# Patient Record
Sex: Female | Born: 1955
Health system: Southern US, Community
[De-identification: ages and names within clinical notes are randomized; demographics above are authoritative.]

## PROBLEM LIST (undated history)

## (undated) DIAGNOSIS — M810 Age-related osteoporosis without current pathological fracture: Secondary | ICD-10-CM

## (undated) DIAGNOSIS — E039 Hypothyroidism, unspecified: Secondary | ICD-10-CM

## (undated) DIAGNOSIS — G43909 Migraine, unspecified, not intractable, without status migrainosus: Secondary | ICD-10-CM

## (undated) DIAGNOSIS — K219 Gastro-esophageal reflux disease without esophagitis: Secondary | ICD-10-CM

## (undated) DIAGNOSIS — F32A Depression, unspecified: Secondary | ICD-10-CM

## (undated) DIAGNOSIS — J309 Allergic rhinitis, unspecified: Secondary | ICD-10-CM

## (undated) DIAGNOSIS — M8580 Other specified disorders of bone density and structure, unspecified site: Secondary | ICD-10-CM

## (undated) DIAGNOSIS — D509 Iron deficiency anemia, unspecified: Secondary | ICD-10-CM

## (undated) DIAGNOSIS — N189 Chronic kidney disease, unspecified: Secondary | ICD-10-CM

## (undated) DIAGNOSIS — R7989 Other specified abnormal findings of blood chemistry: Secondary | ICD-10-CM

## (undated) DIAGNOSIS — F329 Major depressive disorder, single episode, unspecified: Secondary | ICD-10-CM

## (undated) HISTORY — DX: Iron deficiency anemia, unspecified: D50.9

## (undated) HISTORY — DX: Depression, unspecified: F32.A

## (undated) HISTORY — DX: Allergic rhinitis, unspecified: J30.9

## (undated) HISTORY — DX: Gastro-esophageal reflux disease without esophagitis: K21.9

## (undated) HISTORY — DX: Age-related osteoporosis without current pathological fracture: M81.0

## (undated) HISTORY — DX: Chronic kidney disease, unspecified: N18.9

## (undated) HISTORY — DX: Hypothyroidism, unspecified: E03.9

## (undated) HISTORY — DX: Other specified disorders of bone density and structure, unspecified site: M85.80

## (undated) HISTORY — DX: Major depressive disorder, single episode, unspecified: F32.9

## (undated) HISTORY — DX: Other specified abnormal findings of blood chemistry: R79.89

---
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
## 1988-06-05 HISTORY — PX: OTHER SURGICAL HISTORY: SHX169

## 1995-06-06 HISTORY — PX: TUBAL LIGATION: SHX77

## 2009-10-24 ENCOUNTER — Emergency Department (HOSPITAL_COMMUNITY): Admission: EM | Admit: 2009-10-24 | Discharge: 2009-10-24 | Payer: Self-pay | Admitting: Emergency Medicine

## 2014-04-23 ENCOUNTER — Encounter (HOSPITAL_COMMUNITY): Payer: Self-pay | Admitting: *Deleted

## 2014-04-23 ENCOUNTER — Emergency Department (HOSPITAL_COMMUNITY)
Admission: EM | Admit: 2014-04-23 | Discharge: 2014-04-24 | Disposition: A | Discharge status: Home / Self Care | Payer: BC Managed Care – PPO | Attending: Emergency Medicine | Admitting: Emergency Medicine

## 2014-04-23 DIAGNOSIS — G43909 Migraine, unspecified, not intractable, without status migrainosus: Secondary | ICD-10-CM | POA: Insufficient documentation

## 2014-04-23 DIAGNOSIS — R519 Headache, unspecified: Secondary | ICD-10-CM

## 2014-04-23 DIAGNOSIS — R51 Headache: Secondary | ICD-10-CM

## 2014-04-23 HISTORY — DX: Migraine, unspecified, not intractable, without status migrainosus: G43.909

## 2014-04-23 NOTE — ED Notes (Signed)
Pt reporting intermittent headache for 3 days.  Reporting nausea, vomiting, sensitivity to light and sound.  States that she may also have a sinus infection. Pt states that she took 1/2 a demerol tablet, compazine, and an allegra with no relief. Meds taken about 2 this afternoon.

## 2014-04-24 MED ORDER — PROCHLORPERAZINE EDISYLATE 5 MG/ML IJ SOLN
10.0000 mg | Freq: Once | INTRAMUSCULAR | Status: AC
  Administered 2014-04-24: 10 mg via INTRAVENOUS
  Filled 2014-04-24: qty 2

## 2014-04-24 MED ORDER — DIPHENHYDRAMINE HCL 50 MG/ML IJ SOLN
25.0000 mg | Freq: Once | INTRAMUSCULAR | Status: AC
  Administered 2014-04-24: 25 mg via INTRAVENOUS
  Filled 2014-04-24: qty 1

## 2014-04-24 MED ORDER — DEXAMETHASONE SODIUM PHOSPHATE 10 MG/ML IJ SOLN
10.0000 mg | Freq: Once | INTRAMUSCULAR | Status: AC
  Administered 2014-04-24: 10 mg via INTRAVENOUS
  Filled 2014-04-24: qty 1

## 2014-04-24 MED ORDER — SODIUM CHLORIDE 0.9 % IV BOLUS (SEPSIS)
1000.0000 mL | Freq: Once | INTRAVENOUS | Status: AC
  Administered 2014-04-24: 1000 mL via INTRAVENOUS

## 2014-04-24 MED ORDER — KETOROLAC TROMETHAMINE 30 MG/ML IJ SOLN
30.0000 mg | Freq: Once | INTRAMUSCULAR | Status: AC
  Administered 2014-04-24: 30 mg via INTRAVENOUS
  Filled 2014-04-24: qty 1

## 2014-04-24 NOTE — ED Provider Notes (Signed)
CSN: [id number]     Arrival date & time 04/23/14  2201 History  This chart was scribed for Hanley SeamenJohn L Nizhoni Parlow, MD by Annye AsaAnna Dorsett, ED Scribe. This patient was seen in room APA09/APA09 and the patient's care was started at 12:11 AM.    Chief Complaint  Patient presents with  . Migraine   The history is provided by the patient. No language interpreter was used.     HPI Comments: Sabrina Duke is a 58 y.o. female with past medical history of migraines and sinus infections who presents to the Emergency Department complaining of 3 days of intermittent, severe migraine headache. This feels like a typical migraine for her, localized in her left temple and left cheek, described as throbbing. She reports nausea, vomiting, blurred vision, photophobia, phonophobia. She notes that she has been doing sinus rinses, along with taking Allegera, half a Demerol and Compazine with no relief.   She feels as though she has fluid in her ears; she can "hear" a rushing fluid sound.   Past Medical History  Diagnosis Date  . Migraine    Past Surgical History  Procedure Laterality Date  . Hemroidectomy    . Tubal ligation     History reviewed. No pertinent family history. History  Substance Use Topics  . Smoking status: Never Smoker   . Smokeless tobacco: Not on file  . Alcohol Use: No   OB History    No data available     Review of Systems  A complete 10 system review of systems was obtained and all systems are negative except as noted in the HPI and PMH.   Allergies  Ciprofloxacin; Ivp dye; Macrobid; and Vantin  Home Medications   Prior to Admission medications   Not on File   BP 119/56 mmHg  Pulse 66  Temp(Src) 97.9 F (36.6 C) (Oral)  Resp 20  Ht 5\' 4"  (1.626 m)  Wt 123 lb (55.792 kg)  BMI 21.10 kg/m2  SpO2 100%   Physical Exam  Nursing note and vitals reviewed. General: Well-developed, well-nourished female in no acute distress; appearance consistent with age of record HENT:  normocephalic; atraumatic; TMs normal Eyes: pupils equal, round and reactive to light; extraocular muscles intact; photophobia Neck: supple Heart: regular rate and rhythm Lungs: clear to auscultation bilaterally Abdomen: soft; nondistended; nontender; no masses or hepatosplenomegaly; bowel sounds present Extremities: No deformity; full range of motion; pulses normal Neurologic: Awake, alert and oriented; motor function intact in all extremities and symmetric; no facial droop; normal coordination and speech Skin: Warm and dry Psychiatric: Normal mood and affect  ED Course  Procedures   DIAGNOSTIC STUDIES: Oxygen Saturation is 100% on RA, normal by my interpretation.    COORDINATION OF CARE: 12:18 AM Discussed treatment plan with pt at bedside and pt agreed to plan.  MDM  1:40 AM Headache improved after IV fluids and medications. Patient ready to go home.  I personally performed the services described in this documentation, which was scribed in my presence. The recorded information has been reviewed and is accurate.     Hanley SeamenJohn L Uel Davidow, MD 04/24/14 [id number]

## 2014-08-04 ENCOUNTER — Encounter: Payer: Self-pay | Admitting: Gastroenterology

## 2014-09-22 ENCOUNTER — Ambulatory Visit: Payer: Self-pay | Admitting: Gastroenterology

## 2015-05-06 HISTORY — PX: OTHER SURGICAL HISTORY: SHX169

## 2017-05-01 DIAGNOSIS — F331 Major depressive disorder, recurrent, moderate: Secondary | ICD-10-CM | POA: Diagnosis not present

## 2017-06-13 DIAGNOSIS — F331 Major depressive disorder, recurrent, moderate: Secondary | ICD-10-CM | POA: Diagnosis not present

## 2017-06-27 ENCOUNTER — Ambulatory Visit: Payer: BLUE CROSS/BLUE SHIELD | Admitting: Neurology

## 2017-06-27 ENCOUNTER — Encounter: Payer: Self-pay | Admitting: Neurology

## 2017-06-27 VITALS — BP 94/68 | HR 63 | Ht 64.0 in | Wt 146.4 lb

## 2017-06-27 DIAGNOSIS — M5416 Radiculopathy, lumbar region: Secondary | ICD-10-CM

## 2017-06-27 DIAGNOSIS — G43711 Chronic migraine without aura, intractable, with status migrainosus: Secondary | ICD-10-CM

## 2017-06-27 DIAGNOSIS — R29898 Other symptoms and signs involving the musculoskeletal system: Secondary | ICD-10-CM | POA: Diagnosis not present

## 2017-06-27 DIAGNOSIS — M5440 Lumbago with sciatica, unspecified side: Secondary | ICD-10-CM | POA: Diagnosis not present

## 2017-06-27 DIAGNOSIS — G43709 Chronic migraine without aura, not intractable, without status migrainosus: Secondary | ICD-10-CM | POA: Diagnosis not present

## 2017-06-27 DIAGNOSIS — G8929 Other chronic pain: Secondary | ICD-10-CM | POA: Diagnosis not present

## 2017-06-27 DIAGNOSIS — W19XXXA Unspecified fall, initial encounter: Secondary | ICD-10-CM

## 2017-06-27 MED ORDER — TIZANIDINE HCL 4 MG PO CAPS: 4.0000 mg | ORAL_CAPSULE | Freq: Three times a day (TID) | ORAL | 3 refills | Status: DC | PRN

## 2017-06-27 NOTE — Progress Notes (Signed)
[facility]    Provider:  Dr Lucia GaskinsAhern Referring Provider: [name], [name], MD Primary Care Physician:  [name], [name], MD  CC:  migraines  HPI:  Sabrina Duke is a 62 y.o. female here as a referral from Dr. Clarisse GougeLewit for migraines.  Past medical history of migraines, chronic low back pain, depression.  She has migraines for decades. She uses 12 maxalt a month, does not use Tylenol or ibuprofen or other medications. She takes Demerol for severe headaches. In a month she has 20 headache days a month on average and 12 are migrainous. Barometric pressure is a trigger. No known other triggers, maybe stress or sleeping. She endorses photophobia/phonophobia. Migraines can last > 24 hours. No medication overuse. No aura.  Headaches are unilateral, pulsating and pounding. Can be severe. No other focal neurologic deficits, associated symptoms, inciting events or modifiable factors.  Numbness in the leg started 2 months ago, hurts more with leg in a recliner, tingling from the knee to the toe. She has spasms in the back, radicular symptoms, bending over makes it worse with spasms. She has tried conservative measures for 2 months, heat, stretching, exercise, PT, NSAIDs. No relief. Has weakness. It is worsening slowly. No inciting events or trauma.  Reviewed notes, labs and imaging from outside physicians, which showed:  2013 Myelogram: FINDINGS:  AGAIN NOTED PATIENT HAS 6 LUMBAR VERTEBRAL BODIES. THERE IS A MINIMAL VENTRAL DEFECT AT THE LEVEL OF L5/6 SEEN ON LATERAL FILM. THE NERVE ROOTS ARE WELL FILLED WITHOUT EVIDENCE OF COMPRESSION OR DISPLACEMENT. THE CONUS IS NORMAL IN APPEARANCE.  CONCLUSIONS:  1.  SIX LUMBAR VERTEBRAL BODIES. 2.  NO EVIDENCE OF NERVE ROOT COMPRESSION OR DISPLACEMENT. 3.  SMALL VENTRAL BULGE AT L5/6. APPROVING MD: [name], [name]  Reviewed prior notes, patient was seen in the emergency room in November 2015 complaining of intermittent severe migraine headaches.   Localized in her left temple and left cheek described as throbbing.  She reported nausea, vomiting, blurred vision, photophobia, phonophobia.  She tried Demerol and Compazine with no relief.  Felt as though there was fluid in her ears.  Headaches improved after IV fluids and medications and she was discharged home.   Review of Systems: Patient complains of symptoms per HPI as well as the following symptoms: . Pertinent negatives and positives per HPI. All others negative.   Social History   Socioeconomic History  . Marital status: Married    Spouse name: Not on file  . Number of children: 1  . Years of education: Not on file  . Highest education level: High school graduate  Social Needs  . Financial resource strain: Not on file  . Food insecurity - worry: Not on file  . Food insecurity - inability: Not on file  . Transportation needs - medical: Not on file  . Transportation needs - non-medical: Not on file  Occupational History  . Occupation: disabled  Tobacco Use  . Smoking status: Never Smoker  . Smokeless tobacco: Never Used  Substance and Sexual Activity  . Alcohol use: No  . Drug use: No  . Sexual activity: Not on file  Other Topics Concern  . Not on file  Social History Narrative   Lives at home with husband   Right handed   No caffeine    Family History  Problem Relation Age of Onset  . Anxiety disorder Mother   . Depression Mother   . Cerebral aneurysm Maternal Uncle     Past Medical History:  Diagnosis Date  . Decreased bone density   . Depression   . GERD (gastroesophageal reflux disease)   . Iron deficiency anemia   . Low vitamin D level   . Migraine     Past Surgical History:  Procedure Laterality Date  . hemroidectomy  1990  . OVARIAN CYST SURGERY  1972  . TUBAL LIGATION  1997  . uterine polyp removal  05/2015    Current Outpatient Medications  Medication Sig Dispense Refill  . desvenlafaxine (PRISTIQ) 50 MG 24 hr tablet Take 50 mg by  mouth every morning.    . Eszopiclone (ESZOPICLONE) 3 MG TABS Take 3 mg by mouth at bedtime. Take immediately before bedtime    . Magnesium 400 MG TABS Take 400 mg by mouth at bedtime.    . meperidine (DEMEROL) 100 MG tablet Take 100 mg by mouth as needed for pain (no more than 2 per week).    . prochlorperazine (COMPAZINE) 10 MG tablet Take 10 mg by mouth as needed for nausea or vomiting.    . rizatriptan (MAXALT) 10 MG tablet Take 10 mg by mouth as needed for migraine. May repeat in 2 hours if needed    . topiramate (TOPAMAX) 50 MG tablet Take 150 mg by mouth at bedtime.    Marland Kitchen tiZANidine (ZANAFLEX) 4 MG capsule Take 1 capsule (4 mg total) by mouth 3 (three) times daily as needed for muscle spasms. 90 capsule 3   No current facility-administered medications for this visit.     Allergies as of 06/27/2017 - Review Complete 04/23/2014  Allergen Reaction Noted  . Ciprofloxacin  04/23/2014  . Ivp dye [iodinated diagnostic agents]  04/23/2014  . Lortab [hydrocodone-acetaminophen]  06/27/2017  . Macrobid [nitrofurantoin]  04/23/2014  . Nsaids  06/27/2017  . Other  06/27/2017  . Prednisone  06/27/2017  . Vantin [cefpodoxime]  04/23/2014    Vitals: BP 94/68 (BP Location: Right Arm, Patient Position: Sitting)   Pulse 63   Ht 5\' 4"  (1.626 m)   Wt 146 lb 6.4 oz (66.4 kg)   BMI 25.13 kg/m  Last Weight:  Wt Readings from Last 1 Encounters:  06/27/17 146 lb 6.4 oz (66.4 kg)   Last Height:   Ht Readings from Last 1 Encounters:  06/27/17 5\' 4"  (1.626 m)     Physical exam: Exam: Gen: NAD, conversant, well nourised, obese, well groomed                     CV: RRR, no MRG. No Carotid Bruits. No peripheral edema, warm, nontender Eyes: Conjunctivae clear without exudates or hemorrhage  Neuro: Detailed Neurologic Exam  Speech:    Speech is normal; fluent and spontaneous with normal comprehension.  Cognition:    The patient is oriented to person, place, and time;     recent and remote  memory intact;     language fluent;     normal attention, concentration,     fund of knowledge Cranial Nerves:    The pupils are equal, round, and reactive to light. The fundi are normal and spontaneous venous pulsations are present. Visual fields are full to finger confrontation. Extraocular movements are intact. Trigeminal sensation is intact and the muscles of mastication are normal. The face is symmetric. The palate elevates in the midline. Hearing intact. Voice is normal. Shoulder shrug is normal. The tongue has normal motion without fasciculations.   Coordination:    Normal finger to nose and heel to shin. Normal rapid alternating  movements.   Gait:    Heel-toe and tandem gait are normal.   Motor Observation:    No asymmetry, no atrophy, and no involuntary movements noted. Tone:    Normal muscle tone.    Posture:    Posture is normal. normal erect    Strength:    Left leg flexion weakness and left foot dorsiflexion eakness     Sensation: intact to LT     Reflex Exam:  DTR's:    Deep tendon reflexes in the upper and lower extremities are normal bilaterally.   Toes:    The toes are downgoing bilaterally.   Clonus:    Clonus is absent.     Assessment/Plan:  62 year old with chronic migraines, intractable. Also with lumbar radiculopathy, left leg shows weakness in an L5 distribution, she has tried conservative measures and leg is worsening.  MRI lumbar spine for radiculopathy to eval for surgical options or epidural steroid injections Tizanidine for back apsams Continue Topamax and Maxalt for migraines Migraines have not changed in severity or quality or frequency in many years, at this point no indication for MRI of the brain however can consider in the future if needed.  Orders Placed This Encounter  Procedures  . MR LUMBAR SPINE WO CONTRAST  . Comprehensive metabolic panel  . CBC   Discussed: To prevent or relieve headaches, try the following: Cool Compress.  Lie down and place a cool compress on your head.  Avoid headache triggers. If certain foods or odors seem to have triggered your migraines in the past, avoid them. A headache diary might help you identify triggers.  Include physical activity in your daily routine. Try a daily walk or other moderate aerobic exercise.  Manage stress. Find healthy ways to cope with the stressors, such as delegating tasks on your to-do list.  Practice relaxation techniques. Try deep breathing, yoga, massage and visualization.  Eat regularly. Eating regularly scheduled meals and maintaining a healthy diet might help prevent headaches. Also, drink plenty of fluids.  Follow a regular sleep schedule. Sleep deprivation might contribute to headaches Consider biofeedback. With this mind-body technique, you learn to control certain bodily functions - such as muscle tension, heart rate and blood pressure - to prevent headaches or reduce headache pain.    Proceed to emergency room if you experience new or worsening symptoms or symptoms do not resolve, if you have new neurologic symptoms or if headache is severe, or for any concerning symptom.   Provided education and documentation from American headache Society toolbox including articles on: chronic migraine medication overuse headache, chronic migraines, prevention of migraines, behavioral and other nonpharmacologic treatments for headache.  Cc: Amelia Jo, MD  Naomie Dean, MD  [facility] [address] [place], [place] [address]  Phone [phone] Fax [phone]

## 2017-06-27 NOTE — Patient Instructions (Signed)
MRI lumbar spine    

## 2017-07-02 ENCOUNTER — Encounter: Payer: Self-pay | Admitting: Neurology

## 2017-07-02 ENCOUNTER — Telehealth: Payer: Self-pay | Admitting: Neurology

## 2017-07-02 DIAGNOSIS — G43709 Chronic migraine without aura, not intractable, without status migrainosus: Secondary | ICD-10-CM | POA: Insufficient documentation

## 2017-07-02 NOTE — Telephone Encounter (Signed)
Pt calling stating that on her office visit with Dr Lucia GaskinsAhern she was told a low dose muscle relaxer prescription would be given to her but she didn't have anything in her paperwork.  Pt would still like to get it and she would to use  [facility] - [place], [place] - [address] [phone] (Phone) [phone] (Fax)   Please call pt

## 2017-07-02 NOTE — Telephone Encounter (Signed)
Called patient and discussed that Tizanidine 4 mg PO TID PRN had been sent to [facility] on 06/27/17. She is aware to watch for dizziness and RN advised not to drive when she first starts taking it and see how her body reacts to medication. She verbalized understanding and appreciation. She will call with any further questions and will consult pharmacy with any questions @ pickup.

## 2017-07-11 ENCOUNTER — Other Ambulatory Visit: Payer: BLUE CROSS/BLUE SHIELD

## 2017-07-26 DIAGNOSIS — F331 Major depressive disorder, recurrent, moderate: Secondary | ICD-10-CM | POA: Diagnosis not present

## 2017-08-28 ENCOUNTER — Encounter: Payer: Self-pay | Admitting: Adult Health

## 2017-09-14 DIAGNOSIS — F331 Major depressive disorder, recurrent, moderate: Secondary | ICD-10-CM | POA: Diagnosis not present

## 2017-10-01 ENCOUNTER — Other Ambulatory Visit: Payer: Self-pay | Admitting: *Deleted

## 2017-10-01 MED ORDER — RIZATRIPTAN BENZOATE 10 MG PO TABS: 10.0000 mg | ORAL_TABLET | ORAL | 11 refills | Status: DC | PRN

## 2017-10-01 NOTE — Telephone Encounter (Signed)
Received refill request from [facility] for Rizatriptan 10 mg tablet. Per Dr. Lucia Gaskins, ok to initiate refills for a year. Rizatriptan 10 mg #10 tablets, 11 refills ordered and printed.   Prescription faxed to [facility]. Received a receipt of confirmation.

## 2017-10-19 DIAGNOSIS — M25512 Pain in left shoulder: Secondary | ICD-10-CM | POA: Diagnosis not present

## 2017-10-23 DIAGNOSIS — G8929 Other chronic pain: Secondary | ICD-10-CM | POA: Diagnosis not present

## 2017-10-23 DIAGNOSIS — M25512 Pain in left shoulder: Secondary | ICD-10-CM | POA: Diagnosis not present

## 2017-10-23 DIAGNOSIS — M4722 Other spondylosis with radiculopathy, cervical region: Secondary | ICD-10-CM | POA: Diagnosis not present

## 2017-10-23 DIAGNOSIS — Z6825 Body mass index (BMI) 25.0-25.9, adult: Secondary | ICD-10-CM | POA: Diagnosis not present

## 2017-11-03 HISTORY — PX: SHOULDER SURGERY: SHX246

## 2017-11-20 DIAGNOSIS — F331 Major depressive disorder, recurrent, moderate: Secondary | ICD-10-CM | POA: Diagnosis not present

## 2017-11-28 DIAGNOSIS — X58XXXA Exposure to other specified factors, initial encounter: Secondary | ICD-10-CM | POA: Diagnosis not present

## 2017-11-28 DIAGNOSIS — Y999 Unspecified external cause status: Secondary | ICD-10-CM | POA: Diagnosis not present

## 2017-11-28 DIAGNOSIS — S46012A Strain of muscle(s) and tendon(s) of the rotator cuff of left shoulder, initial encounter: Secondary | ICD-10-CM | POA: Diagnosis not present

## 2017-11-28 DIAGNOSIS — M19012 Primary osteoarthritis, left shoulder: Secondary | ICD-10-CM | POA: Diagnosis not present

## 2017-11-28 DIAGNOSIS — G8918 Other acute postprocedural pain: Secondary | ICD-10-CM | POA: Diagnosis not present

## 2017-11-28 DIAGNOSIS — M24112 Other articular cartilage disorders, left shoulder: Secondary | ICD-10-CM | POA: Diagnosis not present

## 2017-11-28 DIAGNOSIS — M7542 Impingement syndrome of left shoulder: Secondary | ICD-10-CM | POA: Diagnosis not present

## 2017-11-28 DIAGNOSIS — M7552 Bursitis of left shoulder: Secondary | ICD-10-CM | POA: Diagnosis not present

## 2017-12-03 DIAGNOSIS — M7542 Impingement syndrome of left shoulder: Secondary | ICD-10-CM | POA: Diagnosis not present

## 2017-12-03 DIAGNOSIS — S46012A Strain of muscle(s) and tendon(s) of the rotator cuff of left shoulder, initial encounter: Secondary | ICD-10-CM | POA: Diagnosis not present

## 2017-12-04 DIAGNOSIS — S46012A Strain of muscle(s) and tendon(s) of the rotator cuff of left shoulder, initial encounter: Secondary | ICD-10-CM | POA: Diagnosis not present

## 2017-12-04 DIAGNOSIS — M7542 Impingement syndrome of left shoulder: Secondary | ICD-10-CM | POA: Diagnosis not present

## 2017-12-10 DIAGNOSIS — M19012 Primary osteoarthritis, left shoulder: Secondary | ICD-10-CM | POA: Diagnosis not present

## 2017-12-13 DIAGNOSIS — M7542 Impingement syndrome of left shoulder: Secondary | ICD-10-CM | POA: Diagnosis not present

## 2017-12-13 DIAGNOSIS — S46012A Strain of muscle(s) and tendon(s) of the rotator cuff of left shoulder, initial encounter: Secondary | ICD-10-CM | POA: Diagnosis not present

## 2017-12-19 DIAGNOSIS — S46012A Strain of muscle(s) and tendon(s) of the rotator cuff of left shoulder, initial encounter: Secondary | ICD-10-CM | POA: Diagnosis not present

## 2017-12-19 DIAGNOSIS — M7542 Impingement syndrome of left shoulder: Secondary | ICD-10-CM | POA: Diagnosis not present

## 2017-12-20 DIAGNOSIS — S46012A Strain of muscle(s) and tendon(s) of the rotator cuff of left shoulder, initial encounter: Secondary | ICD-10-CM | POA: Diagnosis not present

## 2017-12-20 DIAGNOSIS — M7542 Impingement syndrome of left shoulder: Secondary | ICD-10-CM | POA: Diagnosis not present

## 2017-12-25 DIAGNOSIS — M7542 Impingement syndrome of left shoulder: Secondary | ICD-10-CM | POA: Diagnosis not present

## 2017-12-25 DIAGNOSIS — S46012A Strain of muscle(s) and tendon(s) of the rotator cuff of left shoulder, initial encounter: Secondary | ICD-10-CM | POA: Diagnosis not present

## 2017-12-28 DIAGNOSIS — M25512 Pain in left shoulder: Secondary | ICD-10-CM | POA: Diagnosis not present

## 2017-12-31 DIAGNOSIS — S46012A Strain of muscle(s) and tendon(s) of the rotator cuff of left shoulder, initial encounter: Secondary | ICD-10-CM | POA: Diagnosis not present

## 2017-12-31 DIAGNOSIS — M7542 Impingement syndrome of left shoulder: Secondary | ICD-10-CM | POA: Diagnosis not present

## 2018-01-01 ENCOUNTER — Telehealth: Payer: Self-pay | Admitting: Neurology

## 2018-01-01 ENCOUNTER — Ambulatory Visit: Payer: BLUE CROSS/BLUE SHIELD | Admitting: Adult Health

## 2018-01-01 MED ORDER — PROMETHAZINE HCL 25 MG RE SUPP: 25.0000 mg | Freq: Three times a day (TID) | RECTAL | 0 refills | Status: DC | PRN

## 2018-01-01 MED ORDER — PROCHLORPERAZINE MALEATE 10 MG PO TABS: 10.0000 mg | ORAL_TABLET | Freq: Three times a day (TID) | ORAL | 3 refills | Status: DC | PRN

## 2018-01-01 NOTE — Telephone Encounter (Signed)
Pt's had a migraine for the past 7 days. She has taken all the maxalt for the month. She cannot take prednisone or NSAIDS. She is wanting break thru medication sent to [facility].  She needs refill for prochlorperazine (COMPAZINE) 10 MG tablet and asked for rx for phenergan. Please call to advise

## 2018-01-01 NOTE — Telephone Encounter (Addendum)
Spoke with Dr. Lucia GaskinsAhern who agreed to refill pt's Compazine 10 mg TID #30, 3 refills or if pt has been taking this and it isn't working we can try Reglan 5 mg TID x 2 days. At this time we will not provide any other triptans since pt has maxed for the month. Pt reported she cannot take NSAIDS or Prednisone. Pt could be given Toradol shot though. Other option for acute management would be combination of the Tylenol, Compazine, and low dose Benadryl 12.5 mg or 25 mg. This was all discussed with pt on phone. She stated the Compazine has been working for her and she requested the Phenergan PR to use if she cannot hold the Compazine down. Pt was advised to try to take the compazine three times a day while she has this migraine. Dr. Lucia GaskinsAhern agreed to refill both and pt was advised to try the cocktail of Benadryl and Tylenol with the Compazine to see if the combination helps her. Pt advised not to take more than 10 tablets per month of the Maxalt. She can pickup when next refill due. Pt stated she lives in [place] and didn't have a ride for the Toradol shot. Pt advised if she develops any neurological symptoms such as numbness/tingling, weakness, slurred speech, difficulty with balance, worst of headache of life, etc she should call 911 and proceed to ED immediately to get checked out. She verbalized understanding and appreciation of the instructions and refills.   Prescriptions sent to pharmacy for Compazine PO and Phenergan PR.

## 2018-01-07 DIAGNOSIS — S46012A Strain of muscle(s) and tendon(s) of the rotator cuff of left shoulder, initial encounter: Secondary | ICD-10-CM | POA: Diagnosis not present

## 2018-01-07 DIAGNOSIS — M7542 Impingement syndrome of left shoulder: Secondary | ICD-10-CM | POA: Diagnosis not present

## 2018-01-14 DIAGNOSIS — S46012A Strain of muscle(s) and tendon(s) of the rotator cuff of left shoulder, initial encounter: Secondary | ICD-10-CM | POA: Diagnosis not present

## 2018-01-14 DIAGNOSIS — M7542 Impingement syndrome of left shoulder: Secondary | ICD-10-CM | POA: Diagnosis not present

## 2018-01-16 DIAGNOSIS — M7542 Impingement syndrome of left shoulder: Secondary | ICD-10-CM | POA: Diagnosis not present

## 2018-01-16 DIAGNOSIS — S46012A Strain of muscle(s) and tendon(s) of the rotator cuff of left shoulder, initial encounter: Secondary | ICD-10-CM | POA: Diagnosis not present

## 2018-01-17 DIAGNOSIS — F331 Major depressive disorder, recurrent, moderate: Secondary | ICD-10-CM | POA: Diagnosis not present

## 2018-01-21 DIAGNOSIS — M5412 Radiculopathy, cervical region: Secondary | ICD-10-CM | POA: Diagnosis not present

## 2018-01-21 DIAGNOSIS — M542 Cervicalgia: Secondary | ICD-10-CM | POA: Diagnosis not present

## 2018-01-22 DIAGNOSIS — M7542 Impingement syndrome of left shoulder: Secondary | ICD-10-CM | POA: Diagnosis not present

## 2018-01-22 DIAGNOSIS — S46012A Strain of muscle(s) and tendon(s) of the rotator cuff of left shoulder, initial encounter: Secondary | ICD-10-CM | POA: Diagnosis not present

## 2018-01-23 ENCOUNTER — Ambulatory Visit: Payer: BLUE CROSS/BLUE SHIELD | Admitting: Adult Health

## 2018-02-26 ENCOUNTER — Ambulatory Visit: Payer: BLUE CROSS/BLUE SHIELD | Admitting: Adult Health

## 2018-02-26 ENCOUNTER — Telehealth: Payer: Self-pay | Admitting: Adult Health

## 2018-02-26 ENCOUNTER — Encounter: Payer: Self-pay | Admitting: Adult Health

## 2018-02-26 VITALS — BP 112/70 | HR 74 | Ht 64.0 in | Wt 149.4 lb

## 2018-02-26 DIAGNOSIS — G43111 Migraine with aura, intractable, with status migrainosus: Secondary | ICD-10-CM

## 2018-02-26 MED ORDER — TOPIRAMATE 50 MG PO TABS: 150.0000 mg | ORAL_TABLET | Freq: Every day | ORAL | 3 refills | Status: DC

## 2018-02-26 NOTE — Telephone Encounter (Signed)
Per patient's request, demerol was removed from her medication list.

## 2018-02-26 NOTE — Progress Notes (Signed)
made any corrections needed, and agree with history, physical, neuro exam,assessment and plan as stated above.     Carsen Leaf, MD [facility]     

## 2018-02-26 NOTE — Progress Notes (Signed)
PATIENT: Sabrina Duke DOB: April 12, 1956  REASON FOR VISIT: follow up HISTORY FROM: patient  HISTORY OF PRESENT ILLNESS: Today 02/26/18: Sabrina Duke is a 62 year old female with a history of migraine headaches.  She returns today for follow-up.  He states that her headaches have remained relatively the same.  She states that she has 10 severe headaches a month but has at least 15 headache days a month.  Her headaches always occur on the left side.  She does have photophobia and phonophobia as well as nausea and vomiting.  She takes Maxalt but it does not always resolve the headache.  She typically has to take Phenergan and occasionally Compazine to make her go to sleep for headache to resolve.  In the past she has tried multiple medications including  Inderal, Depakote, gabapentin, nortriptyline and Botox without benefit.  She returns today for evaluation.  HISTORY Sabrina Duke is a 62 y.o. female here as a referral from Sabrina Duke for migraines.  Past medical history of migraines, chronic low back pain, depression.  She has migraines for decades. She uses 12 maxalt a month, does not use Tylenol or ibuprofen or other medications. She takes Demerol for severe headaches. In a month she has 20 headache days a month on average and 12 are migrainous. Barometric pressure is a trigger. No known other triggers, maybe stress or sleeping. She endorses photophobia/phonophobia. Migraines can last > 24 hours. No medication overuse. No aura.  Headaches are unilateral, pulsating and pounding. Can be severe. No other focal neurologic deficits, associated symptoms, inciting events or modifiable factors.  Numbness in the leg started 2 months ago, hurts more with leg in a recliner, tingling from the knee to the toe. She has spasms in the back, radicular symptoms, bending over makes it worse with spasms. She has tried conservative measures for 2 months, heat, stretching, exercise, PT, NSAIDs. No relief. Has weakness. It is  worsening slowly. No inciting events or trauma.  Reviewed notes, labs and imaging from outside physicians, which showed:  2013 Myelogram: FINDINGS:  AGAIN NOTED PATIENT HAS 6 LUMBAR VERTEBRAL BODIES. THERE IS A MINIMAL VENTRAL DEFECT AT THE LEVEL OF L5/6 SEEN ON LATERAL FILM. THE NERVE ROOTS ARE WELL FILLED WITHOUT EVIDENCE OF COMPRESSION OR DISPLACEMENT. THE CONUS IS NORMAL IN APPEARANCE.  CONCLUSIONS:  1.  SIX LUMBAR VERTEBRAL BODIES. 2.  NO EVIDENCE OF NERVE ROOT COMPRESSION OR DISPLACEMENT. 3.  SMALL VENTRAL BULGE AT L5/6. APPROVING MD: Murtis Sink  Reviewed prior notes, patient was seen in the emergency room in November 2015 complaining of intermittent severe migraine headaches.  Localized in her left temple and left cheek described as throbbing.  She reported nausea, vomiting, blurred vision, photophobia, phonophobia.  She tried Demerol and Compazine with no relief.  Felt as though there was fluid in her ears.  Headaches improved after IV fluids and medications and she was discharged home.    REVIEW OF SYSTEMS: Out of a complete 14 system review of symptoms, the patient complains only of the following symptoms, and all other reviewed systems are negative.  See HPI  ALLERGIES: Allergies  Allergen Reactions  . Ciprofloxacin   . Ivp Dye [Iodinated Diagnostic Agents]   . Lortab [Hydrocodone-Acetaminophen]   . Macrobid [Nitrofurantoin]   . Nsaids     Pt can tolerate Aleve "mess my stomach up, have had ulcer in the past"  . Other     Darvocet-   Mermantin- "makes me sick on my stomach"  Bio Citrate Calcium with Vitamin D- "hurt stomach"  Antihistamine- "makes me very sleepy no matter when I take them including singulair"    . Prednisone     "makes me hyper and gives me a headache"  . Vantin [Cefpodoxime]     HOME MEDICATIONS: Outpatient Medications Prior to Visit  Medication Sig Dispense Refill  . azithromycin (ZITHROMAX) 250 MG tablet   0  .  buPROPion (WELLBUTRIN XL) 150 MG 24 hr tablet 150 mg daily.  1  . Eszopiclone (ESZOPICLONE) 3 MG TABS Take 3 mg by mouth at bedtime. Take immediately before bedtime    . levothyroxine (SYNTHROID, LEVOTHROID) 25 MCG tablet 25 mcg daily.  5  . Magnesium 400 MG TABS Take 400 mg by mouth at bedtime.    . prochlorperazine (COMPAZINE) 10 MG tablet Take 1 tablet (10 mg total) by mouth 3 (three) times daily as needed for nausea or vomiting. 30 tablet 3  . promethazine (PHENERGAN) 25 MG suppository Place 1 suppository (25 mg total) rectally every 8 (eight) hours as needed (for nausea or vomiting when unable to take Compazine). 12 each 0  . rizatriptan (MAXALT) 10 MG tablet Take 1 tablet (10 mg total) by mouth as needed for migraine. May repeat in 2 hours if needed. No more than 2 in 24 hours or 2-3 days per week. 10 tablet 11  . topiramate (TOPAMAX) 50 MG tablet Take 150 mg by mouth at bedtime.    . Vitamin D, Ergocalciferol, (DRISDOL) 50000 units CAPS capsule   6  . zolpidem (AMBIEN) 5 MG tablet 5 mg at bedtime.  1  . desvenlafaxine (PRISTIQ) 50 MG 24 hr tablet Take 50 mg by mouth every morning.    . meperidine (DEMEROL) 100 MG tablet Take 100 mg by mouth as needed for pain (no more than 2 per week).    Marland Kitchen. tiZANidine (ZANAFLEX) 4 MG capsule Take 1 capsule (4 mg total) by mouth 3 (three) times daily as needed for muscle spasms. (Patient not taking: Reported on 02/26/2018) 90 capsule 3   No facility-administered medications prior to visit.     PAST MEDICAL HISTORY: Past Medical History:  Diagnosis Date  . Decreased bone density   . Depression   . GERD (gastroesophageal reflux disease)   . Iron deficiency anemia   . Low vitamin D level   . Migraine     PAST SURGICAL HISTORY: Past Surgical History:  Procedure Laterality Date  . hemroidectomy  521990  . OVARIAN CYST SURGERY  1972  . SHOULDER SURGERY Left 11/2017  . TUBAL LIGATION  1997  . uterine polyp removal  05/2015    FAMILY HISTORY: Family  History  Problem Relation Age of Onset  . Anxiety disorder Mother   . Depression Mother   . Bone cancer Mother   . Cerebral aneurysm Maternal Uncle     SOCIAL HISTORY: Social History   Socioeconomic History  . Marital status: Married    Spouse name: Not on file  . Number of children: 1  . Years of education: Not on file  . Highest education level: High school graduate  Occupational History  . Occupation: disabled  Social Needs  . Financial resource strain: Not on file  . Food insecurity:    Worry: Not on file    Inability: Not on file  . Transportation needs:    Medical: Not on file    Non-medical: Not on file  Tobacco Use  . Smoking status: Never Smoker  . Smokeless  tobacco: Never Used  Substance and Sexual Activity  . Alcohol use: No  . Drug use: No  . Sexual activity: Not on file  Lifestyle  . Physical activity:    Days per week: Not on file    Minutes per session: Not on file  . Stress: Not on file  Relationships  . Social connections:    Talks on phone: Not on file    Gets together: Not on file    Attends religious service: Not on file    Active member of club or organization: Not on file    Attends meetings of clubs or organizations: Not on file    Relationship status: Not on file  . Intimate partner violence:    Fear of current or ex partner: Not on file    Emotionally abused: Not on file    Physically abused: Not on file    Forced sexual activity: Not on file  Other Topics Concern  . Not on file  Social History Narrative   Lives at home with husband   Right handed   No caffeine      PHYSICAL EXAM  Vitals:   02/26/18 1302  BP: 112/70  Pulse: 74  Weight: 149 lb 6.4 oz (67.8 kg)  Height: 5\' 4"  (1.626 m)   Body mass index is 25.64 kg/m.  Generalized: Well developed, in no acute distress   Neurological examination  Mentation: Alert oriented to time, place, history taking. Follows all commands speech and language fluent Cranial nerve  II-XII: Pupils were equal round reactive to light. Extraocular movements were full, visual field were full on confrontational test. Facial sensation and strength were normal. Uvula tongue midline. Head turning and shoulder shrug  were normal and symmetric. Motor: The motor testing reveals 5 over 5 strength of all 4 extremities. Good symmetric motor tone is noted throughout.  Sensory: Sensory testing is intact to soft touch on all 4 extremities. No evidence of extinction is noted.  Coordination: Cerebellar testing reveals good finger-nose-finger and heel-to-shin bilaterally.  Gait and station: Gait is normal. Reflexes: Deep tendon reflexes are symmetric and normal bilaterally.   DIAGNOSTIC DATA (LABS, IMAGING, TESTING) - I reviewed patient records, labs, notes, testing and imaging myself where available.     ASSESSMENT AND PLAN 62 y.o. year old female  has a past medical history of Decreased bone density, Depression, GERD (gastroesophageal reflux disease), Iron deficiency anemia, Low vitamin D level, and Migraine. here with:  1.  Migraine headache  The patient will continue on Topamax and Maxalt.  We discussed Aimovig however she is not sure that she can give herself an injection.  I explained that if she is willing to try this we did have the nurses give her the injection each month. I reviewed potential side effects with the patient.  Patient will read over the information and let me know if she wants to start this medication.  She is advised that if her symptoms worsen or she develops new symptoms she should let us know.  She will follow-up in 6 months or sooner if needed.   Butch Penny, MSN, NP-C 02/26/2018, 1:13 PM [facility] [address], [address] [place], [place] [address] [phone]

## 2018-02-26 NOTE — Patient Instructions (Signed)
Your Plan:  Continue Topamax and Maxalt Consider Aimovig If your symptoms worsen or you develop new symptoms please let us know.   [facility] you [facility]. I hope we have been able to provide you high quality care today.  You may receive a patient satisfaction survey over the next few weeks. We would appreciate your feedback and comments so that we may continue to improve ourselves and the health of our patients.

## 2018-02-26 NOTE — Telephone Encounter (Signed)
Pt called requesting medication meperidine (DEMEROL) 100 MG tablet be taken off her list stating she has taking medication in about 14 months.

## 2018-03-05 DIAGNOSIS — Z6825 Body mass index (BMI) 25.0-25.9, adult: Secondary | ICD-10-CM | POA: Diagnosis not present

## 2018-03-05 DIAGNOSIS — G43911 Migraine, unspecified, intractable, with status migrainosus: Secondary | ICD-10-CM | POA: Diagnosis not present

## 2018-03-15 ENCOUNTER — Ambulatory Visit: Payer: Self-pay | Admitting: Psychiatry

## 2018-03-18 ENCOUNTER — Ambulatory Visit: Payer: Self-pay | Admitting: Psychiatry

## 2018-03-21 ENCOUNTER — Telehealth: Payer: Self-pay | Admitting: Psychiatry

## 2018-03-21 DIAGNOSIS — F5101 Primary insomnia: Secondary | ICD-10-CM

## 2018-03-21 MED ORDER — ESZOPICLONE 2 MG PO TABS: 2.0000 mg | ORAL_TABLET | Freq: Every evening | ORAL | 1 refills | Status: DC | PRN

## 2018-03-21 NOTE — Telephone Encounter (Signed)
Pt called and said that the Sabrina Duke is not working and would like to go back to the [place] 2mg . [facility] [phone]. Next appt is nov 25th

## 2018-04-09 ENCOUNTER — Encounter: Payer: Self-pay | Admitting: Emergency Medicine

## 2018-04-09 DIAGNOSIS — G43911 Migraine, unspecified, intractable, with status migrainosus: Secondary | ICD-10-CM | POA: Insufficient documentation

## 2018-04-09 DIAGNOSIS — G4700 Insomnia, unspecified: Secondary | ICD-10-CM

## 2018-04-09 DIAGNOSIS — G43811 Other migraine, intractable, with status migrainosus: Secondary | ICD-10-CM

## 2018-04-09 DIAGNOSIS — F411 Generalized anxiety disorder: Secondary | ICD-10-CM | POA: Insufficient documentation

## 2018-04-29 ENCOUNTER — Ambulatory Visit: Payer: Self-pay | Admitting: Psychiatry

## 2018-05-01 ENCOUNTER — Telehealth: Payer: Self-pay | Admitting: Psychiatry

## 2018-05-01 MED ORDER — BUPROPION HCL ER (XL) 300 MG PO TB24: 300.0000 mg | ORAL_TABLET | ORAL | 1 refills | Status: DC

## 2018-05-01 MED ORDER — BUPROPION HCL ER (XL) 150 MG PO TB24: 150.0000 mg | ORAL_TABLET | Freq: Every day | ORAL | 1 refills | Status: DC

## 2018-05-01 NOTE — Telephone Encounter (Signed)
Pt notified with information

## 2018-05-01 NOTE — Telephone Encounter (Signed)
Patient called and requested increase in Wellbutrin to twice daily due to having crying episodes.  Will increase Wellbutrin XL from 300 mg in the morning to 450 mg in the morning.  Will send in prescriptions for 150 mg and 300 mg tabs to be combined for total dose of 450 mg daily.

## 2018-05-16 ENCOUNTER — Ambulatory Visit (INDEPENDENT_AMBULATORY_CARE_PROVIDER_SITE_OTHER): Payer: BLUE CROSS/BLUE SHIELD | Admitting: Psychiatry

## 2018-05-16 ENCOUNTER — Encounter: Payer: Self-pay | Admitting: Psychiatry

## 2018-05-16 ENCOUNTER — Encounter

## 2018-05-16 VITALS — BP 120/81 | HR 79

## 2018-05-16 DIAGNOSIS — F5101 Primary insomnia: Secondary | ICD-10-CM

## 2018-05-16 DIAGNOSIS — F3342 Major depressive disorder, recurrent, in full remission: Secondary | ICD-10-CM | POA: Diagnosis not present

## 2018-05-16 DIAGNOSIS — F411 Generalized anxiety disorder: Secondary | ICD-10-CM

## 2018-05-16 MED ORDER — ESZOPICLONE 2 MG PO TABS: 2.0000 mg | ORAL_TABLET | Freq: Every evening | ORAL | 5 refills | Status: DC | PRN

## 2018-05-16 NOTE — Progress Notes (Signed)
Sabrina Duke [id number] 07/06/1955 62 y.o.  Subjective:   Patient ID:  Sabrina Duke is a 62 y.o. (DOB 04/29/1956) female.  Chief Complaint:  Chief Complaint  Patient presents with  . Follow-up    h/o Depression, anxiety, and insomnia    HPI Sabrina Duke presents to the office today for follow-up of depression and anxiety.   "I've been doing good. The Wellbutrin has really helped my energy." She reports that she is now getting up and getting out of the house. Has been able to be more active with her grandchildren. Denies tolerability issues with Wellbutrin and has not had any increase in HA's. Denies current depressed mood. Denies anxiety. Sleeping well. Reports losing 5 lbs and attributes this to being more active. Denies concentration difficulties. Denies SI.   Mother went into SNF in August and mother has been very demanding.Mother was dx'd with bone cancer. Had to clean out mother's apartment and sell her belongings in 3 weeks. Has been doing her mother's laundry and helpingwith her personal items.  Reports that she and her mother have never been close. Sister[place]in-law in [place] died around Thanksgiving. Brother is going to come visit in January.   Past medication trials: Wellbutrin Trintellix-GI side effects Pristiq Lexapro Celexa Prozac Cymbalta Rexulti Vraylar-jittery, hallucinations Latuda-joint aches Abilify-weight gain Lamictal BuSpar Topamax Trazodone-excessive somnolence Lunesta Ambien-ineffective Adderall XR   Review of Systems:  Review of Systems  Gastrointestinal: Positive for nausea.  Genitourinary:       Recently dx'd with Kidney infection.   Musculoskeletal: Positive for back pain. Negative for gait problem.  Neurological: Negative for tremors.       Chronic HA's have been about "the same."   Psychiatric/Behavioral:       Please refer to HPI    Medications: I have reviewed the patient's current medications.  Current Outpatient Medications   Medication Sig Dispense Refill  . buPROPion (WELLBUTRIN XL) 150 MG 24 hr tablet Take 1 tablet (150 mg total) by mouth daily. Take with 300 mg tablet to equal total dose of 450 mg daily 30 tablet 1  . buPROPion (WELLBUTRIN XL) 300 MG 24 hr tablet Take 1 tablet (300 mg total) by mouth every morning. Take with 150 mg tablet to equal total dose of 450 mg daily 30 tablet 1  . levothyroxine (SYNTHROID, LEVOTHROID) 25 MCG tablet 25 mcg daily.  5  . Magnesium 400 MG TABS Take 400 mg by mouth at bedtime.    . prochlorperazine (COMPAZINE) 10 MG tablet Take 1 tablet (10 mg total) by mouth 3 (three) times daily as needed for nausea or vomiting. 30 tablet 3  . promethazine (PHENERGAN) 25 MG suppository Place 1 suppository (25 mg total) rectally every 8 (eight) hours as needed (for nausea or vomiting when unable to take Compazine). 12 each 0  . rizatriptan (MAXALT) 10 MG tablet Take 1 tablet (10 mg total) by mouth as needed for migraine. May repeat in 2 hours if needed. No more than 2 in 24 hours or 2-3 days per week. 10 tablet 11  . topiramate (TOPAMAX) 50 MG tablet Take 3 tablets (150 mg total) by mouth at bedtime. 270 tablet 3  . Vitamin D, Ergocalciferol, (DRISDOL) 50000 units CAPS capsule   6  . eszopiclone (LUNESTA) 2 MG TABS tablet Take 1 tablet (2 mg total) by mouth at bedtime as needed for sleep. Take immediately before bedtime 30 tablet 5   No current facility-administered medications for this visit.  Medication Side Effects: None  Allergies:  Allergies  Allergen Reactions  . Ciprofloxacin   . Dymista [Azelastine-Fluticasone]   . Ivp Dye [Iodinated Diagnostic Agents]   . Lortab [Hydrocodone-Acetaminophen]   . Macrobid [Nitrofurantoin]   . Nsaids     Pt can tolerate Aleve "mess my stomach up, have had ulcer in the past"  . Other     Darvocet-   Mermantin- "makes me sick on my stomach"  Bio Citrate Calcium with Vitamin D- "hurt stomach"  Antihistamine- "makes me very sleepy no  matter when I take them including singulair"    . Prednisone     "makes me hyper and gives me a headache"  . Vantin [Cefpodoxime]     Past Medical History:  Diagnosis Date  . Allergic rhinitis   . Decreased bone density   . Depression   . GERD (gastroesophageal reflux disease)   . Iron deficiency anemia   . Low vitamin D level   . Migraine     Family History  Problem Relation Age of Onset  . Anxiety disorder Mother   . Depression Mother   . Bone cancer Mother   . OCD Mother   . Cerebral aneurysm Maternal Uncle   . Drug abuse Brother   . Depression Brother     Social History   Socioeconomic History  . Marital status: Married    Spouse name: Not on file  . Number of children: 1  . Years of education: Not on file  . Highest education level: High school graduate  Occupational History  . Occupation: disabled  Social Needs  . Financial resource strain: Not on file  . Food insecurity:    Worry: Not on file    Inability: Not on file  . Transportation needs:    Medical: Not on file    Non-medical: Not on file  Tobacco Use  . Smoking status: Former Games developer  . Smokeless tobacco: Never Used  Substance and Sexual Activity  . Alcohol use: No  . Drug use: No  . Sexual activity: Not on file  Lifestyle  . Physical activity:    Days per week: Not on file    Minutes per session: Not on file  . Stress: Not on file  Relationships  . Social connections:    Talks on phone: Not on file    Gets together: Not on file    Attends religious service: Not on file    Active member of club or organization: Not on file    Attends meetings of clubs or organizations: Not on file    Relationship status: Not on file  . Intimate partner violence:    Fear of current or ex partner: Not on file    Emotionally abused: Not on file    Physically abused: Not on file    Forced sexual activity: Not on file  Other Topics Concern  . Not on file  Social History Narrative   Lives at home with  husband   Right handed   No caffeine    Past Medical History, Surgical history, Social history, and Family history were reviewed and updated as appropriate.   Please see review of systems for further details on the patient's review from today.   Objective:   Physical Exam:  BP 120/81   Pulse 79   Physical Exam Constitutional:      General: She is not in acute distress.    Appearance: She is well-developed.  Musculoskeletal:  General: No deformity.  Neurological:     Mental Status: She is alert and oriented to person, place, and time.     Coordination: Coordination normal.  Psychiatric:        Mood and Affect: Mood is not anxious or depressed. Affect is not labile, blunt, angry or inappropriate.        Speech: Speech normal.        Behavior: Behavior normal.        Thought Content: Thought content normal. Thought content does not include homicidal or suicidal ideation. Thought content does not include homicidal or suicidal plan.        Judgment: Judgment normal.     Comments: Insight intact. No auditory or visual hallucinations. No delusions.      Lab Review:  No results found for: NA, K, CL, CO2, GLUCOSE, BUN, CREATININE, CALCIUM, PROT, ALBUMIN, AST, ALT, ALKPHOS, BILITOT, GFRNONAA, GFRAA  No results found for: WBC, RBC, HGB, HCT, PLT, MCV, MCH, MCHC, RDW, LYMPHSABS, MONOABS, EOSABS, BASOSABS  No results found for: POCLITH, LITHIUM   No results found for: PHENYTOIN, PHENOBARB, VALPROATE, CBMZ   .res Assessment: Plan:   Continue Wellbutrin XL 150 mg p.o. every morning for depression Continue Lunesta 2 mg at bedtime for insomnia. Recurrent major depressive disorder, in full remission (HCC) - Improved with Wellbutrin  Primary insomnia - Chronic, stable - Plan: eszopiclone (LUNESTA) 2 MG TABS tablet  Anxiety state - Stable  Please see After Visit Summary for patient specific instructions.  Future Appointments  Date Time Provider Department Center  11/12/2018   1:30 PM Shawnie Dapper, NP [facility] None  11/26/2018  1:45 PM Corie Chiquito, PMHNP [facility] None    No orders of the defined types were placed in this encounter.     -------------------------------

## 2018-06-27 ENCOUNTER — Ambulatory Visit (INDEPENDENT_AMBULATORY_CARE_PROVIDER_SITE_OTHER): Payer: Self-pay | Admitting: Internal Medicine

## 2018-07-09 ENCOUNTER — Telehealth: Payer: Self-pay | Admitting: Psychiatry

## 2018-07-09 DIAGNOSIS — F5101 Primary insomnia: Secondary | ICD-10-CM

## 2018-07-09 MED ORDER — ESZOPICLONE 3 MG PO TABS: 3.0000 mg | ORAL_TABLET | Freq: Every day | ORAL | 2 refills | Status: DC

## 2018-07-09 NOTE — Telephone Encounter (Signed)
Pt called ask if  Lunesta 2 mg be increased to 3 mg. Just not sleeping at night.

## 2018-07-09 NOTE — Telephone Encounter (Signed)
Pt.notified

## 2018-07-09 NOTE — Telephone Encounter (Signed)
Please review request

## 2018-07-09 NOTE — Addendum Note (Signed)
Addended by: Derenda Mis on: 07/09/2018 02:07 PM   Modules accepted: Orders

## 2018-07-31 ENCOUNTER — Encounter (INDEPENDENT_AMBULATORY_CARE_PROVIDER_SITE_OTHER): Payer: Self-pay | Admitting: Internal Medicine

## 2018-07-31 ENCOUNTER — Ambulatory Visit (INDEPENDENT_AMBULATORY_CARE_PROVIDER_SITE_OTHER): Payer: BLUE CROSS/BLUE SHIELD | Admitting: Internal Medicine

## 2018-07-31 VITALS — BP 111/63 | HR 85 | Temp 98.3°F | Ht 64.0 in | Wt 145.8 lb

## 2018-07-31 DIAGNOSIS — K588 Other irritable bowel syndrome: Secondary | ICD-10-CM

## 2018-07-31 DIAGNOSIS — K219 Gastro-esophageal reflux disease without esophagitis: Secondary | ICD-10-CM

## 2018-07-31 MED ORDER — DICYCLOMINE HCL 10 MG PO CAPS: 10.0000 mg | ORAL_CAPSULE | Freq: Two times a day (BID) | ORAL | 3 refills | Status: DC

## 2018-07-31 MED ORDER — OMEPRAZOLE 40 MG PO CPDR: 40.0000 mg | DELAYED_RELEASE_CAPSULE | Freq: Every day | ORAL | 11 refills | Status: DC

## 2018-07-31 NOTE — Patient Instructions (Signed)
Take the Omeprazole daily.  Take the Dicyclomine one in the am and one the pm.

## 2018-07-31 NOTE — Progress Notes (Addendum)
Subjective:    Patient ID: Sabrina Duke, female    DOB: [birth year], 63 y.o.   MRN: [id number]  HPI Referred by Dr.  Mardella Layman for abdominal pain . She says her abdomen is sore. No matter what she eats or doesn't eat, she has gas and bloating. If she takes the Omeprazole it will help. Sometimes she has constipation and sometimes she has diarrhea. She has tried Gas X.  Previous patient of Dr. Aleene Davidson.  Last colonoscopy in 2015 and was normal. She alternates between constipation and diarrhea. When she is constipated, she will drink apple or grape juice.  Has not tried anything for the IBS. Her last colonoscopy was in 2015 Dr. Aleene Davidson) screening and was normal. .  Grandmother died of pancreatic cancer in her 26.  Both grandparents had colon cancer.   05/16/2018 US abdomen:  No acute pathology. Review of Systems Past Medical History:  Diagnosis Date  . Allergic rhinitis   . Decreased bone density   . Depression   . GERD (gastroesophageal reflux disease)   . Hypothyroid   . Iron deficiency anemia   . Low vitamin D level   . Migraine     Past Surgical History:  Procedure Laterality Date  . hemroidectomy  1990  . OVARIAN CYST SURGERY  1972  . SHOULDER SURGERY Left 11/2017  . TUBAL LIGATION  1997  . uterine polyp removal  05/2015    Allergies  Allergen Reactions  . Ciprofloxacin   . Dymista [Azelastine-Fluticasone]   . Ivp Dye [Iodinated Diagnostic Agents]   . Lortab [Hydrocodone-Acetaminophen]   . Macrobid [Nitrofurantoin]   . Nsaids     Pt can tolerate Aleve "mess my stomach up, have had ulcer in the past"  . Other     Darvocet-   Mermantin- "makes me sick on my stomach"  Bio Citrate Calcium with Vitamin D- "hurt stomach"  Antihistamine- "makes me very sleepy no matter when I take them including singulair"    . Prednisone     "makes me hyper and gives me a headache"  . Vantin [Cefpodoxime]     Current Outpatient Medications on File Prior to Visit    Medication Sig Dispense Refill  . buPROPion (WELLBUTRIN XL) 150 MG 24 hr tablet Take 150 mg by mouth daily.    . Eszopiclone (ESZOPICLONE) 3 MG TABS Take 1 tablet (3 mg total) by mouth at bedtime. Take immediately before bedtime 30 tablet 2  . eszopiclone (LUNESTA) 2 MG TABS tablet Take 3 mg by mouth at bedtime as needed for sleep. Take immediately before bedtime    . levothyroxine (SYNTHROID, LEVOTHROID) 25 MCG tablet 25 mcg daily.  5  . Magnesium 400 MG TABS Take 400 mg by mouth at bedtime.    Marland Kitchen omeprazole (PRILOSEC) 20 MG capsule Take 20 mg by mouth daily.    . prochlorperazine (COMPAZINE) 10 MG tablet Take 1 tablet (10 mg total) by mouth 3 (three) times daily as needed for nausea or vomiting. 30 tablet 3  . rizatriptan (MAXALT) 10 MG tablet Take 1 tablet (10 mg total) by mouth as needed for migraine. May repeat in 2 hours if needed. No more than 2 in 24 hours or 2-3 days per week. 10 tablet 11  . topiramate (TOPAMAX) 50 MG tablet Take 3 tablets (150 mg total) by mouth at bedtime. 270 tablet 3  . eszopiclone (LUNESTA) 2 MG TABS tablet Take 1 tablet (2 mg total) by mouth at bedtime as needed for sleep.  Take immediately before bedtime 30 tablet 5   No current facility-administered medications on file prior to visit.         Objective:   Physical Exam Blood pressure 111/63, pulse 85, temperature 98.3 F (36.8 C), height 5\' 4"  (1.626 m), weight 145 lb 12.8 oz (66.1 kg). Alert and oriented. Skin warm and dry. Oral mucosa is moist.   . Sclera anicteric, conjunctivae is pink. Thyroid not enlarged. No cervical lymphadenopathy. Lungs clear. Heart regular rate and rhythm.  Abdomen is soft. Bowel sounds are positive. No hepatomegaly. No abdominal masses felt. No tenderness.  No edema to lower extremities.           Assessment & Plan:  IBS. Am going to start her on Dicyclomine 10mg  BID. GERD/Bloating: Will leave her on the Omeprazole. She says Protonix gave her diarrhea.  If not better in 3  months, EGD OV in 3 months.

## 2018-09-19 ENCOUNTER — Other Ambulatory Visit: Payer: Self-pay

## 2018-09-19 NOTE — Telephone Encounter (Signed)
Received paperwork requesting a refill on her rizatriptan (MAXALT) 10 MG tablet.

## 2018-09-20 MED ORDER — RIZATRIPTAN BENZOATE 10 MG PO TABS: 10.0000 mg | ORAL_TABLET | ORAL | 11 refills | Status: DC | PRN

## 2018-09-27 ENCOUNTER — Telehealth: Payer: Self-pay | Admitting: Psychiatry

## 2018-09-27 DIAGNOSIS — F3342 Major depressive disorder, recurrent, in full remission: Secondary | ICD-10-CM

## 2018-09-27 MED ORDER — BUPROPION HCL ER (XL) 150 MG PO TB24: 150.0000 mg | ORAL_TABLET | Freq: Every day | ORAL | 1 refills | Status: DC

## 2018-09-27 NOTE — Telephone Encounter (Signed)
Patient need refill on Wellbutrin to be sent to [facility] in [place] [phone].  Patient is out of medication

## 2018-10-01 ENCOUNTER — Telehealth: Payer: Self-pay | Admitting: Psychiatry

## 2018-10-01 DIAGNOSIS — F5101 Primary insomnia: Secondary | ICD-10-CM

## 2018-10-01 MED ORDER — ESZOPICLONE 3 MG PO TABS: 3.0000 mg | ORAL_TABLET | Freq: Every day | ORAL | 1 refills | Status: DC

## 2018-10-01 NOTE — Telephone Encounter (Signed)
Patient need refill on Lunesta 3 mg., sent to [facility] in [place] [phone]

## 2018-10-02 ENCOUNTER — Ambulatory Visit (INDEPENDENT_AMBULATORY_CARE_PROVIDER_SITE_OTHER): Payer: BLUE CROSS/BLUE SHIELD | Admitting: Internal Medicine

## 2018-10-02 ENCOUNTER — Encounter (INDEPENDENT_AMBULATORY_CARE_PROVIDER_SITE_OTHER): Payer: Self-pay | Admitting: Internal Medicine

## 2018-10-02 ENCOUNTER — Other Ambulatory Visit: Payer: Self-pay

## 2018-10-02 DIAGNOSIS — K219 Gastro-esophageal reflux disease without esophagitis: Secondary | ICD-10-CM | POA: Diagnosis not present

## 2018-10-02 MED ORDER — FAMOTIDINE 40 MG PO TABS: 40.0000 mg | ORAL_TABLET | Freq: Every day | ORAL | 3 refills | Status: DC

## 2018-10-02 NOTE — Progress Notes (Signed)
Subjective:    Patient ID: Sabrina Duke, female    DOB: 11/05/1955, 63 y.o.   MRN: [id number] [facility] Dr. Lucia Estelleripathi HPI  Start time 220. Ended This is a telephone OV. Patient consents to this OV. She is at home. I am in the office. Telephone OV due to risk of COVID-19. Unable to do video OV.  Seen in Office 07/31/2018 as a new patient.  She tells me she has bee taking the Omeprazole and Bentyl. She says she kept having headaches. She called her headache Dr and her medications for headaches were increased. She has stopped the Omeprazole and her headaches resolved. She continued the Bentyl.   he tells me she is doing good otherwise. She says she does need something for her GERD. Sometimes her stools are green . Her appetite is okay. No weight loss.  Has a BM everyday or every other day.    . Her last colonoscopy was in 2015 Dr. Aleene DavidsonSpainhour) screening and was normal.  Grandmother died of pancreatic cancer in her 8490.  Both grandparents had colon cancer.   05/16/2018 US abdomen:  No acute pathology. Review of Systems Past Medical History:  Diagnosis Date  . Allergic rhinitis   . Decreased bone density   . Depression   . GERD (gastroesophageal reflux disease)   . Hypothyroid   . Iron deficiency anemia   . Low vitamin D level   . Migraine     Past Surgical History:  Procedure Laterality Date  . hemroidectomy  1990  . OVARIAN CYST SURGERY  1972  . SHOULDER SURGERY Left 11/2017  . TUBAL LIGATION  1997  . uterine polyp removal  05/2015    Allergies  Allergen Reactions  . Ciprofloxacin   . Dymista [Azelastine-Fluticasone]   . Ivp Dye [Iodinated Diagnostic Agents]   . Lortab [Hydrocodone-Acetaminophen]   . Macrobid [Nitrofurantoin]   . Nsaids     Pt can tolerate Aleve "mess my stomach up, have had ulcer in the past"  . Other     Darvocet-   Mermantin- "makes me sick on my stomach"  Bio Citrate Calcium with Vitamin D- "hurt stomach"  Antihistamine- "makes me very sleepy no  matter when I take them including singulair"    . Prednisone     "makes me hyper and gives me a headache"  . Vantin [Cefpodoxime]     Current Outpatient Medications on File Prior to Visit  Medication Sig Dispense Refill  . buPROPion (WELLBUTRIN XL) 150 MG 24 hr tablet Take 1 tablet (150 mg total) by mouth daily. 90 tablet 1  . dicyclomine (BENTYL) 10 MG capsule Take 1 capsule (10 mg total) by mouth 2 (two) times daily. 60 capsule 3  . Eszopiclone (ESZOPICLONE) 3 MG TABS Take 3 mg by mouth at bedtime. Take immediately before bedtime    . levothyroxine (SYNTHROID, LEVOTHROID) 25 MCG tablet 50 mcg daily.   5  . Magnesium 400 MG TABS Take 400 mg by mouth at bedtime.    Marland Kitchen. omeprazole (PRILOSEC) 40 MG capsule Take 1 capsule (40 mg total) by mouth daily. 30 capsule 11  . rizatriptan (MAXALT) 10 MG tablet Take 1 tablet (10 mg total) by mouth as needed for migraine. May repeat in 2 hours if needed. No more than 2 in 24 hours or 2-3 days per week. 10 tablet 11  . topiramate (TOPAMAX) 50 MG tablet Take 3 tablets (150 mg total) by mouth at bedtime. 270 tablet 3   No current facility-administered  medications on file prior to visit.         Objective:   Physical Exam Deferred.        Assessment & Plan:  GERD. Will switch her to Pepcid daily. Hopefully this will not cause her headaches.  OV in 4 months.

## 2018-10-02 NOTE — Patient Instructions (Signed)
Rx for Pepcid sent to her pharmacy. OV in 4 months.

## 2018-10-18 DIAGNOSIS — M25511 Pain in right shoulder: Secondary | ICD-10-CM | POA: Diagnosis not present

## 2018-10-29 ENCOUNTER — Ambulatory Visit (INDEPENDENT_AMBULATORY_CARE_PROVIDER_SITE_OTHER): Payer: BLUE CROSS/BLUE SHIELD | Admitting: Internal Medicine

## 2018-11-05 ENCOUNTER — Telehealth: Payer: Self-pay

## 2018-11-05 NOTE — Telephone Encounter (Signed)
Pt returned call and provided consent to doxy.me visit, pt also provided consent to file insurance   [email or web address]  [email or web address]  Pt received link through email

## 2018-11-05 NOTE — Telephone Encounter (Signed)
Noted! Thank you

## 2018-11-05 NOTE — Telephone Encounter (Signed)
Unable to get in contact with the patient to convert their office visit with Amy on 11/12/2018 into a doxy.me visit. I left a voicemail asking the patient to return my call. Office number was provided.   If patient calls back please convert their office visit into a doxy.me visit.   

## 2018-11-12 ENCOUNTER — Ambulatory Visit (INDEPENDENT_AMBULATORY_CARE_PROVIDER_SITE_OTHER): Payer: BC Managed Care – PPO | Admitting: Family Medicine

## 2018-11-12 ENCOUNTER — Ambulatory Visit: Payer: BLUE CROSS/BLUE SHIELD | Admitting: Adult Health

## 2018-11-12 ENCOUNTER — Other Ambulatory Visit: Payer: Self-pay

## 2018-11-12 ENCOUNTER — Encounter: Payer: Self-pay | Admitting: Family Medicine

## 2018-11-12 DIAGNOSIS — G43709 Chronic migraine without aura, not intractable, without status migrainosus: Secondary | ICD-10-CM

## 2018-11-12 MED ORDER — TOPIRAMATE 50 MG PO TABS: 150.0000 mg | ORAL_TABLET | Freq: Every day | ORAL | 3 refills | Status: DC

## 2018-11-12 MED ORDER — ERENUMAB-AOOE 140 MG/ML ~~LOC~~ SOAJ: 140.0000 mg | SUBCUTANEOUS | 3 refills | Status: DC

## 2018-11-12 MED ORDER — RIZATRIPTAN BENZOATE 10 MG PO TABS: 10.0000 mg | ORAL_TABLET | ORAL | 11 refills | Status: DC | PRN

## 2018-11-12 NOTE — Progress Notes (Signed)
PATIENT: Sabrina Duke DOB: 09/24/1955  REASON FOR VISIT: follow up HISTORY FROM: patient  Virtual Visit via Telephone Note  I connected with Sabrina Duke on 11/12/18 at  1:30 PM EDT by telephone and verified that I am speaking with the correct person using two identifiers.   I discussed the limitations, risks, security and privacy concerns of performing an evaluation and management service by telephone and the availability of in person appointments. I also discussed with the patient that there may be a patient responsible charge related to this service. The patient expressed understanding and agreed to proceed.   History of Present Illness:  11/12/18 Sabrina Duke is a 63 y.o. female here today for follow up for migraines.  She continues to have regular migraines.  She states that she is going through a full prescription of Maxalt every month.  She does feel that Maxalt tends to help abort migraine.  She does continue topiramate 150 mg at bedtime without adverse effects.  She is uncertain if this is helping.  She reports that migraines are typical to those in the past.  She has no new symptoms.  She has tried multiple medications in the past but has never taken Aimovig.   HISTORY ([facility]ied from [facility])  Sabrina Duke is a 63 year old female with a history of migraine headaches.  She returns today for follow-up.  He states that her headaches have remained relatively the same.  She states that she has 10 severe headaches a month but has at least 15 headache days a month.  Her headaches always occur on the left side.  She does have photophobia and phonophobia as well as nausea and vomiting.  She takes Maxalt but it does not always resolve the headache.  She typically has to take Phenergan and occasionally Compazine to make her go to sleep for headache to resolve.  In the past she has tried multiple medications including  Inderal, Depakote, gabapentin, nortriptyline and  Botox without benefit.  She returns today for evaluation.  HISTORY Sabrina MendsSherry D Cookis a 63 y.o.femalehere as a referral from Dr. Myra GianottiLewitfor migraines.Past medical history of migraines, chronic low back pain,depression. She has migraines for decades. She uses 12 maxalt a month, does not use Tylenol or ibuprofen or other medications. She takes Demerol for severe headaches. In a month she has 20 headache days a month on average and 12 are migrainous. Barometric pressure is a trigger.No known other triggers, maybe stress or sleeping. She endorses photophobia/phonophobia. Migraines can last >24 hours. No medication overuse. No aura. Headaches are unilateral, pulsating and pounding. Can be severe. No other focal neurologic deficits, associated symptoms, inciting events or modifiable factors.  Numbness in the leg started 2 months ago, hurts more with leg in a recliner, tingling from the knee to the toe. She has spasms in the back, radicular symptoms, bending over makes it worse with spasms.She has tried conservative measures for 2 months, heat, stretching, exercise, PT, NSAIDs. No relief. Has weakness. It is worsening slowly. No inciting events or trauma.  Reviewed notes, labs and imaging from outside physicians, which showed:  2013 Myelogram:FINDINGS: AGAIN NOTED PATIENT HAS 6 LUMBAR VERTEBRAL BODIES. THERE IS A MINIMAL VENTRAL DEFECT AT THE LEVEL OF L5/6 SEEN ON LATERAL FILM. THE NERVE ROOTS ARE WELL FILLED WITHOUT EVIDENCE OF COMPRESSION OR DISPLACEMENT. THE CONUS IS NORMAL IN APPEARANCE.  CONCLUSIONS:  1. SIX LUMBAR VERTEBRAL BODIES. 2. NO EVIDENCE OF NERVE ROOT COMPRESSION OR DISPLACEMENT. 3. SMALL  VENTRAL BULGE AT L5/6. APPROVING MD: Sabrina Duke  Reviewed prior notes, patient was seen in the emergency room in November 2015 complaining of intermittent severe migraine headaches. Localized in her left temple and left cheek described as throbbing. She reported nausea,  vomiting, blurred vision, photophobia, phonophobia. She tried Demerol and Compazine with no relief. Felt as though there was fluid in her ears. Headaches improved after IV fluids and medications and she was discharged home.   Observations/Objective:  Generalized: Well developed, in no acute distress  Mentation: Alert oriented to time, place, history taking. Follows all commands speech and language fluent   Assessment and Plan:  63 y.o. year old female  has a past medical history of Allergic rhinitis, Decreased bone density, Depression, GERD (gastroesophageal reflux disease), Hypothyroid, Iron deficiency anemia, Low vitamin D level, and Migraine. here with    ICD-10-CM   1. Chronic migraine without aura without status migrainosus, not intractable G43.709 [name]   Sabrina Duke continues to have regular migraines requiring a full prescription of Maxalt every month.  I feel that she would benefit from adding Aimovig 140 mg every 30 days.  We will continue topiramate 150 mg at bedtime and rizatriptan for abortive therapy.  She was initially hesitant regarding injection therapy, however, after discussion of appropriate administration and storage of medication she feels that she is able to have her daughter administer medication.  We have discussed side effects in detail.  She was advised to call with any concerns after starting medication.  We will schedule 9-month follow-up to assess response to medication.  She verbalizes understanding agreement with this plan.  No orders of the defined types were placed in this encounter.   Meds ordered this encounter  Medications   [name]    Sig: Inject 140 mg into the skin every 30 (thirty) days.    Dispense:  3 pen    Refill:  3    Order Specific Question:   Supervising Provider    Answer:   Melvenia Beam [id number]   rizatriptan (MAXALT) 10 MG tablet    Sig: Take 1 tablet (10 mg total) by mouth as needed  for migraine. May repeat in 2 hours if needed. No more than 2 in 24 hours or 2-3 days per week.    Dispense:  10 tablet    Refill:  11    Order Specific Question:   Supervising Provider    Answer:   Melvenia Beam [[id number]]   topiramate (TOPAMAX) 50 MG tablet    Sig: Take 3 tablets (150 mg total) by mouth at bedtime.    Dispense:  270 tablet    Refill:  3    Order Specific Question:   Supervising Provider    Answer:   Melvenia Beam [id number]     Follow Up Instructions:  I discussed the assessment and treatment plan with the patient. The patient was provided an opportunity to ask questions and all were answered. The patient agreed with the plan and demonstrated an understanding of the instructions.   The patient was advised to call back or seek an in-person evaluation if the symptoms worsen or if the condition fails to improve as anticipated.  I provided 20 minutes of non-face-to-face time during this encounter.  Patient is located at her place of residence during video conference.  Provider is in the office.  Maryelizabeth Kaufmann, CMA helped to facilitate visit.   Debbora Presto, NP

## 2018-11-15 NOTE — Progress Notes (Signed)
Made any corrections needed, and agree with history, physical, neuro exam,assessment and plan as stated.     Antonia Ahern, MD [facility]  

## 2018-11-25 ENCOUNTER — Telehealth: Payer: Self-pay | Admitting: Family Medicine

## 2018-11-25 NOTE — Telephone Encounter (Signed)
Pt called stating that ever since she started with the Amovig she has had a sore throat, runny nose etc. She would like to be advised on what she can do.

## 2018-11-26 ENCOUNTER — Ambulatory Visit: Payer: BLUE CROSS/BLUE SHIELD | Admitting: Psychiatry

## 2018-11-26 NOTE — Telephone Encounter (Signed)
Spoke with the patient and she stated that she started her Aimovig injection last Wednesday and she started having symptoms. She stated that she is now waking up with a headache everyday since starting Aimovig. She declined a follow up with her PCP and [place]. She stated that she just wanted make [place] aware of what's going on. She will not be getting her next injection per patient.

## 2018-11-26 NOTE — Telephone Encounter (Signed)
Please let her know that these are not typical side effects of this medication. Are her headaches better after starting Amovig? If symptoms are mild she can continue. She may want to reach out to her PCP to assess if she could be having allergy symptoms. If she feels symptoms are too much to handle she can stop Amovig. We will need to schedule a follow up to discuss additional treatment options if she chooses to stop Amovig.

## 2018-11-26 NOTE — Telephone Encounter (Signed)
I am happy to change to [facility] if she prefers to try that instead of Amovig. If symptoms are new or changing she needs follow up. Thank you.

## 2018-11-27 NOTE — Telephone Encounter (Signed)
Unable to get in contact with the patient to see if she would like to try Emgality instead of Aimovig. I left a voicemail asking her to return my call. Office number was provided.

## 2018-11-27 NOTE — Telephone Encounter (Signed)
Pt returned call and stated she doesn't want to start the emgaility she would prefer to wait until aimovig is out of her system

## 2018-12-06 ENCOUNTER — Other Ambulatory Visit: Payer: Self-pay | Admitting: Physician Assistant

## 2018-12-06 MED ORDER — ESZOPICLONE 3 MG PO TABS: 3.0000 mg | ORAL_TABLET | Freq: Every evening | ORAL | 0 refills | Status: DC | PRN

## 2018-12-09 ENCOUNTER — Other Ambulatory Visit: Payer: Self-pay

## 2018-12-09 ENCOUNTER — Telehealth: Payer: Self-pay | Admitting: Psychiatry

## 2018-12-09 DIAGNOSIS — F3342 Major depressive disorder, recurrent, in full remission: Secondary | ICD-10-CM

## 2018-12-09 MED ORDER — BUPROPION HCL ER (XL) 150 MG PO TB24: 150.0000 mg | ORAL_TABLET | Freq: Every day | ORAL | 0 refills | Status: DC

## 2018-12-09 MED ORDER — ESZOPICLONE 3 MG PO TABS: 3.0000 mg | ORAL_TABLET | Freq: Every evening | ORAL | 0 refills | Status: DC | PRN

## 2018-12-09 MED ORDER — BUPROPION HCL ER (XL) 300 MG PO TB24: 300.0000 mg | ORAL_TABLET | Freq: Every day | ORAL | 0 refills | Status: DC

## 2018-12-09 NOTE — Telephone Encounter (Signed)
Patient was able to get a refill of Wellbutrin and Lunesta to last until today.  Patient will need refills sent to [facility]  [phone]

## 2018-12-09 NOTE — Telephone Encounter (Signed)
Will pend for provider to approve

## 2018-12-31 ENCOUNTER — Telehealth: Payer: Self-pay | Admitting: Psychiatry

## 2018-12-31 NOTE — Telephone Encounter (Addendum)
Patient has appointment 08/18 her Sabrina Duke will run out on 07/06 will need refill by Sat., going on vacation until 08/08 please send to [place].

## 2019-01-01 ENCOUNTER — Telehealth (INDEPENDENT_AMBULATORY_CARE_PROVIDER_SITE_OTHER): Payer: Self-pay | Admitting: Internal Medicine

## 2019-01-01 NOTE — Telephone Encounter (Signed)
Patient called stated she is having some acid reflux wants to know if you could call her in something - ph# [phone]

## 2019-01-02 ENCOUNTER — Other Ambulatory Visit (INDEPENDENT_AMBULATORY_CARE_PROVIDER_SITE_OTHER): Payer: Self-pay | Admitting: Internal Medicine

## 2019-01-02 DIAGNOSIS — K219 Gastro-esophageal reflux disease without esophagitis: Secondary | ICD-10-CM

## 2019-01-02 MED ORDER — PANTOPRAZOLE SODIUM 40 MG PO TBEC: 40.0000 mg | DELAYED_RELEASE_TABLET | Freq: Every day | ORAL | 3 refills | Status: DC

## 2019-01-02 NOTE — Telephone Encounter (Signed)
Spoke with pharmacist and authorized early refill, no more refills on file but pt has appt 01/21/2019

## 2019-01-02 NOTE — Progress Notes (Signed)
Pro to

## 2019-01-02 NOTE — Telephone Encounter (Signed)
Message left on her phone. Rx for Protonix sent to her pharmacy

## 2019-01-03 ENCOUNTER — Other Ambulatory Visit: Payer: Self-pay | Admitting: Neurology

## 2019-01-03 ENCOUNTER — Telehealth: Payer: Self-pay | Admitting: Family Medicine

## 2019-01-03 MED ORDER — NARATRIPTAN HCL 2.5 MG PO TABS: 2.5000 mg | ORAL_TABLET | Freq: Two times a day (BID) | ORAL | 0 refills | Status: DC

## 2019-01-03 NOTE — Telephone Encounter (Signed)
Sabrina Duke, I gave her amerge twice daily for 5 days. She did not tolerate the aimovig and she says she tried botox in the past, she is coming back for a follow up let's discuss before then. She declines trying ajovy or erenumab.

## 2019-01-03 NOTE — Telephone Encounter (Signed)
Pt states she is experiencing migraines, she has already taken her maxalt and is still in pain.  Pt asking   For Dr Jaynee Eagles to call in something for her before she goes on vacation this weekend

## 2019-01-09 ENCOUNTER — Ambulatory Visit: Payer: BC Managed Care – PPO | Admitting: Psychiatry

## 2019-01-15 ENCOUNTER — Telehealth (INDEPENDENT_AMBULATORY_CARE_PROVIDER_SITE_OTHER): Payer: Self-pay | Admitting: Nurse Practitioner

## 2019-01-15 NOTE — Telephone Encounter (Signed)
Patient left message stating [name] had prescribed her Prilosec which gives her a headache - then was prescribed Pepcid AC and that doesn't work - wants to know if there is anything else she can try  -  Ph# [phone]

## 2019-01-16 NOTE — Telephone Encounter (Signed)
I called patient, Prilosec worked well for her heartburn but triggered a migraine headache. Pepcid AC 40mg  QD did not reduce her heartburn. Voice is hoarse. I advised she try Nexium 20mg  once daily, explained this med works similar to [employer], potentially can cause headache but she may tolerate it without headache, trial and error. Also, advised Gaviscon 1 tablespoon twice daily mid day and before or after dinner. She will call back if not tolerated or if reflux not controlled on this regimen.

## 2019-01-21 ENCOUNTER — Encounter: Payer: Self-pay | Admitting: Psychiatry

## 2019-01-21 ENCOUNTER — Ambulatory Visit (INDEPENDENT_AMBULATORY_CARE_PROVIDER_SITE_OTHER): Payer: BC Managed Care – PPO | Admitting: Psychiatry

## 2019-01-21 ENCOUNTER — Other Ambulatory Visit: Payer: Self-pay

## 2019-01-21 VITALS — BP 102/76 | HR 78

## 2019-01-21 DIAGNOSIS — F5101 Primary insomnia: Secondary | ICD-10-CM

## 2019-01-21 DIAGNOSIS — F3342 Major depressive disorder, recurrent, in full remission: Secondary | ICD-10-CM | POA: Diagnosis not present

## 2019-01-21 MED ORDER — BUPROPION HCL ER (XL) 300 MG PO TB24: 300.0000 mg | ORAL_TABLET | Freq: Every day | ORAL | 1 refills | Status: DC

## 2019-01-21 MED ORDER — ESZOPICLONE 3 MG PO TABS: 3.0000 mg | ORAL_TABLET | Freq: Every evening | ORAL | 5 refills | Status: DC | PRN

## 2019-01-21 MED ORDER — BUPROPION HCL ER (XL) 150 MG PO TB24: 150.0000 mg | ORAL_TABLET | Freq: Every day | ORAL | 1 refills | Status: DC

## 2019-01-21 NOTE — Progress Notes (Signed)
Sabrina BondSherry D Portee [id number] 06/16/1955 63 y.o.  Subjective:   Patient ID:  Sabrina Duke is a [id number] y.o. (DOB 03/25/1956) female.  Chief Complaint:  Chief Complaint  Patient presents with  . Follow-up    h/o depression, anxiety, insomnia    HPI Sabrina BondSherry D Kennebrew presents to the office today for follow-up of depression and anxiety. Mother has been in nursing home and has not seen her since November. Mother has bone cancer. Mother recently fell, was sent to ER, and was then dx'd with dehydration and hyperkalemia. Pt reports that she has been "ill" with nursing home staff. Denies any other issues with irritability.   Denies persistent sad mood. Reports that she has periodic crying spells, typically related to grief such as seeing deceased family member's pictures or anniversary of deaths. She reports that energy and motivation is "100% better." Reports that if she does not take medication she has difficulty getting out of bed. Denies any recent anxiety. Reports losing 5 lbs. Denies any change in appetite. She reports adequate sleep with medication. Typically sleeps from 1 am- 10 am. She reports adequate concentration. Denies SI.   Recent beach trip with family and enjoyed this.   Past medication trials: Wellbutrin- Effective, well tolerated Trintellix-GI side effects Pristiq Lexapro Celexa Prozac Cymbalta Rexulti Vraylar-jittery, hallucinations Latuda-joint aches Abilify-weight gain Lamictal BuSpar Topamax Trazodone-excessive somnolence Lunesta Ambien-ineffective Adderall XR  Review of Systems:  Review of Systems  HENT: Positive for voice change.   Gastrointestinal:       Reports severe acid reflux and reports that Prilosec, Pepcid, and Nexium have been causing her HA's.   Musculoskeletal: Positive for arthralgias. Negative for gait problem.       Shoulder pain  Neurological: Positive for headaches.  Psychiatric/Behavioral:       Please refer to HPI    Medications: I have  reviewed the patient's current medications.  Current Outpatient Medications  Medication Sig Dispense Refill  . buPROPion (WELLBUTRIN XL) 150 MG 24 hr tablet Take 1 tablet (150 mg total) by mouth daily. 90 tablet 1  . buPROPion (WELLBUTRIN XL) 300 MG 24 hr tablet Take 1 tablet (300 mg total) by mouth daily. 90 tablet 1  . [START ON 01/31/2019] Eszopiclone (ESZOPICLONE) 3 MG TABS Take 1 tablet (3 mg total) by mouth at bedtime as needed. Take immediately before bedtime 30 tablet 5  . levothyroxine (SYNTHROID, LEVOTHROID) 25 MCG tablet 50 mcg daily.   5  . Magnesium 400 MG TABS Take 400 mg by mouth at bedtime.    . rizatriptan (MAXALT) 10 MG tablet Take 1 tablet (10 mg total) by mouth as needed for migraine. May repeat in 2 hours if needed. No more than 2 in 24 hours or 2-3 days per week. 10 tablet 11  . topiramate (TOPAMAX) 50 MG tablet Take 3 tablets (150 mg total) by mouth at bedtime. 270 tablet 3  . dicyclomine (BENTYL) 10 MG capsule Take 1 capsule (10 mg total) by mouth 2 (two) times daily. (Patient not taking: Reported on 01/21/2019) 60 capsule 3  . [name] Inject 140 mg into the skin every 30 (thirty) days. (Patient not taking: Reported on 01/21/2019) 3 pen 3  . famotidine (PEPCID) 40 MG tablet Take 1 tablet (40 mg total) by mouth daily. (Patient not taking: Reported on 01/21/2019) 30 tablet 3  . naratriptan (AMERGE) 2.5 MG tablet Take 1 tablet (2.5 mg total) by mouth 2 (two) times a day. Take twice daily for 5 days  in a row and then stop. (Patient not taking: Reported on 01/21/2019) 10 tablet 0  . omeprazole (PRILOSEC) 40 MG capsule Take 1 capsule (40 mg total) by mouth daily. (Patient not taking: Reported on 01/21/2019) 30 capsule 11  . pantoprazole (PROTONIX) 40 MG tablet Take 1 tablet (40 mg total) by mouth daily. (Patient not taking: Reported on 01/21/2019) 90 tablet 3   No current facility-administered medications for this visit.     Medication Side Effects:  None  Allergies:  Allergies  Allergen Reactions  . Ciprofloxacin   . Dymista [Azelastine-Fluticasone]   . Ivp Dye [Iodinated Diagnostic Agents]   . Lortab [Hydrocodone-Acetaminophen]   . Macrobid [Nitrofurantoin]   . Nsaids     Pt can tolerate Aleve "mess my stomach up, have had ulcer in the past"  . Other     Darvocet-   Mermantin- "makes me sick on my stomach"  Bio Citrate Calcium with Vitamin D- "hurt stomach"  Antihistamine- "makes me very sleepy no matter when I take them including singulair"    . Pepcid [Famotidine]   . Prednisone     "makes me hyper and gives me a headache"  . Prilosec [Omeprazole]   . Vantin [Cefpodoxime]     Past Medical History:  Diagnosis Date  . Allergic rhinitis   . Decreased bone density   . Depression   . GERD (gastroesophageal reflux disease)   . Hypothyroid   . Iron deficiency anemia   . Low vitamin D level   . Migraine     Family History  Problem Relation Age of Onset  . Anxiety disorder Mother   . Depression Mother   . Bone cancer Mother   . OCD Mother   . Cerebral aneurysm Maternal Uncle   . Drug abuse Brother   . Depression Brother     Social History   Socioeconomic History  . Marital status: Married    Spouse name: Not on file  . Number of children: 1  . Years of education: Not on file  . Highest education level: High school graduate  Occupational History  . Occupation: disabled  Social Needs  . Financial resource strain: Not on file  . Food insecurity    Worry: Not on file    Inability: Not on file  . Transportation needs    Medical: Not on file    Non-medical: Not on file  Tobacco Use  . Smoking status: Former Research scientist (life sciences)  . Smokeless tobacco: Never Used  Substance and Sexual Activity  . Alcohol use: No  . Drug use: No  . Sexual activity: Not on file  Lifestyle  . Physical activity    Days per week: Not on file    Minutes per session: Not on file  . Stress: Not on file  Relationships  . Social  Herbalist on phone: Not on file    Gets together: Not on file    Attends religious service: Not on file    Active member of club or organization: Not on file    Attends meetings of clubs or organizations: Not on file    Relationship status: Not on file  . Intimate partner violence    Fear of current or ex partner: Not on file    Emotionally abused: Not on file    Physically abused: Not on file    Forced sexual activity: Not on file  Other Topics Concern  . Not on file  Social History Narrative  Lives at home with husband   Right handed   No caffeine    Past Medical History, Surgical history, Social history, and Family history were reviewed and updated as appropriate.   Please see review of systems for further details on the patient's review from today.   Objective:   Physical Exam:  BP 102/76   Pulse 78   Physical Exam Constitutional:      General: She is not in acute distress.    Appearance: She is well-developed.  Musculoskeletal:        General: No deformity.  Neurological:     Mental Status: She is alert and oriented to person, place, and time.     Coordination: Coordination normal.  Psychiatric:        Attention and Perception: Attention and perception normal. She does not perceive auditory or visual hallucinations.        Mood and Affect: Mood normal. Mood is not anxious or depressed. Affect is not labile, blunt, angry or inappropriate.        Speech: Speech normal.        Behavior: Behavior normal.        Thought Content: Thought content normal. Thought content does not include homicidal or suicidal ideation. Thought content does not include homicidal or suicidal plan.        Cognition and Memory: Cognition and memory normal.        Judgment: Judgment normal.     Comments: Insight intact. No delusions.      Lab Review:  No results found for: NA, K, CL, CO2, GLUCOSE, BUN, CREATININE, CALCIUM, PROT, ALBUMIN, AST, ALT, ALKPHOS, BILITOT, GFRNONAA,  GFRAA  No results found for: WBC, RBC, HGB, HCT, PLT, MCV, MCH, MCHC, RDW, LYMPHSABS, MONOABS, EOSABS, BASOSABS  No results found for: POCLITH, LITHIUM   No results found for: PHENYTOIN, PHENOBARB, VALPROATE, CBMZ   .res Assessment: Plan:   Will continue current medications since patient reports that target signs and symptoms are well controlled without any tolerability issues. Patient to follow-up in 6 months or sooner if clinically indicated. Patient advised to contact office with any questions, adverse effects, or acute worsening in signs and symptoms.  Cordelia PenSherry was seen today for follow-up.  Diagnoses and all orders for this visit:  Primary insomnia -     Eszopiclone (ESZOPICLONE) 3 MG TABS; Take 1 tablet (3 mg total) by mouth at bedtime as needed. Take immediately before bedtime  Recurrent major depressive disorder, in full remission (HCC) -     buPROPion (WELLBUTRIN XL) 150 MG 24 hr tablet; Take 1 tablet (150 mg total) by mouth daily. -     buPROPion (WELLBUTRIN XL) 300 MG 24 hr tablet; Take 1 tablet (300 mg total) by mouth daily.     Please see After Visit Summary for patient specific instructions.  Future Appointments  Date Time Provider Department Center  02/03/2019  2:00 PM Arnaldo NatalKennedy-Smith, Colleen M, NP [facility] None  05/15/2019  3:00 PM Shawnie DapperLomax, Amy, NP [facility] None  07/24/2019  1:00 PM Corie Chiquitoarter, Laneya Gasaway, PMHNP [facility] None    No orders of the defined types were placed in this encounter.   -------------------------------

## 2019-02-02 NOTE — Progress Notes (Addendum)
Subjective:    Patient ID: Sabrina BondSherry D Reali, female    DOB: 09/11/1955, 63 y.o.   MRN: [id number]  HPI Sabrina Duke is a 63 year old female with a past medical history of depression, hypothyroidism, IDA and GERD. GERD has not been well controlled despite trials with, Protonix, Prilosec and Nexium which were stopped because she developed increased migraine headaches while taking these medications.  Pepcid was ineffective.  She has not yet tried Gaviscon as previously recommended. She complains of having epigastric pain, indigestion, increased gas with burping. She feels hot acid back in her mouth, hot water comes back into mouth. No dysphagia. She complains of having epigastric pain with associated abdominal bloating. She is having nausea. No vomiting. She has intermittent constipation. She is passing greenish solid stool once daily. Stool has been green for the past 4 months. No rectal bleeding. She reported passing a black tarry stool x 1 occasion Jan or Feb. 2020. No further episodes of black stool. No fever, sweats or chills. No weight loss.  She has never had an EGD.  Both grandparents with a history of colon cancer.  She underwent a screening colonoscopy in 2015 that was normal, she was advised by Dr. BelarusSpain hour to repeat a colonoscopy in 5 years.  Colonoscopy 03/25/2014 by Dr.Spainhour: normal, no polyps. 7172yr recall recommended due to family history of colon polyps and colon cancer.   Past Medical History:  Diagnosis Date  . Allergic rhinitis   . Decreased bone density   . Depression   . GERD (gastroesophageal reflux disease)   . Hypothyroid   . Iron deficiency anemia   . Low vitamin D level   . Migraine    Past Surgical History:  Procedure Laterality Date  . hemroidectomy  1990  . OVARIAN CYST SURGERY  1972  . SHOULDER SURGERY Left 11/2017  . TUBAL LIGATION  1997  . uterine polyp removal  05/2015    Current Outpatient Medications on File Prior to Visit  Medication Sig Dispense  Refill  . buPROPion (WELLBUTRIN XL) 150 MG 24 hr tablet Take 1 tablet (150 mg total) by mouth daily. 90 tablet 1  . buPROPion (WELLBUTRIN XL) 300 MG 24 hr tablet Take 1 tablet (300 mg total) by mouth daily. 90 tablet 1  . dicyclomine (BENTYL) 10 MG capsule Take 1 capsule (10 mg total) by mouth 2 (two) times daily. (Patient not taking: Reported on 01/21/2019) 60 capsule 3  . [name] Inject 140 mg into the skin every 30 (thirty) days. (Patient not taking: Reported on 01/21/2019) 3 pen 3  . Eszopiclone (ESZOPICLONE) 3 MG TABS Take 1 tablet (3 mg total) by mouth at bedtime as needed. Take immediately before bedtime 30 tablet 5  . famotidine (PEPCID) 40 MG tablet Take 1 tablet (40 mg total) by mouth daily. (Patient not taking: Reported on 01/21/2019) 30 tablet 3  . levothyroxine (SYNTHROID, LEVOTHROID) 25 MCG tablet 50 mcg daily.   5  . Magnesium 400 MG TABS Take 400 mg by mouth at bedtime.    . naratriptan (AMERGE) 2.5 MG tablet Take 1 tablet (2.5 mg total) by mouth 2 (two) times a day. Take twice daily for 5 days in a row and then stop. (Patient not taking: Reported on 01/21/2019) 10 tablet 0  . omeprazole (PRILOSEC) 40 MG capsule Take 1 capsule (40 mg total) by mouth daily. (Patient not taking: Reported on 01/21/2019) 30 capsule 11  . pantoprazole (PROTONIX) 40 MG tablet Take  1 tablet (40 mg total) by mouth daily. (Patient not taking: Reported on 01/21/2019) 90 tablet 3  . rizatriptan (MAXALT) 10 MG tablet Take 1 tablet (10 mg total) by mouth as needed for migraine. May repeat in 2 hours if needed. No more than 2 in 24 hours or 2-3 days per week. 10 tablet 11  . topiramate (TOPAMAX) 50 MG tablet Take 3 tablets (150 mg total) by mouth at bedtime. 270 tablet 3   No current facility-administered medications on file prior to visit.    Allergies  Allergen Reactions  . Ciprofloxacin   . Dymista [Azelastine-Fluticasone]   . Ivp Dye [Iodinated Diagnostic Agents]   . Lortab  [Hydrocodone-Acetaminophen]   . Macrobid [Nitrofurantoin]   . Nsaids     Pt can tolerate Aleve "mess my stomach up, have had ulcer in the past"  . Other     Darvocet-   Mermantin- "makes me sick on my stomach"  Bio Citrate Calcium with Vitamin D- "hurt stomach"  Antihistamine- "makes me very sleepy no matter when I take them including singulair"    . Pepcid [Famotidine]   . Prednisone     "makes me hyper and gives me a headache"  . Prilosec [Omeprazole]   . Vantin [Cefpodoxime]    Review of Systems see HPI, all other systems reviewed and are negative     Objective:   Physical Exam Blood pressure 108/72, pulse 78, temperature 97.6 F (36.4 C), height 5\' 4"  (1.626 m), weight 142 lb 11.2 oz (64.7 kg). General: 63 year old female well-developed in no acute distress Eyes: Sclera nonicteric, conjunctiva pink Mouth: Dentition intact Neck: Supple, no thyromegaly or lymphadenopathy Heart: Regular rate and rhythm, no murmurs Lungs: Breath sounds clear throughout abdomen: Soft, epigastric tenderness that rebound or guarding, positive bowel sounds all 4 quadrants, no HSM Extremities: No edema Neuro: Alert and oriented x4, no focal deficits    Assessment & Plan:   1.  GERD symptoms with epigastric pain unable to tolerate PPI tx -Schedule EGD, benefits and risk discussed -Carafate 1 g 1 tab p.o. twice daily not to be taken within 2 hours of any medication -Abdominal sonogram to rule out gallstones -Further follow-up to be determined after the above evaluation completed -Request copy of CBC and CMP done recently by PCP  2.  Family history of colon cancer, last colonoscopy 03/2014 no polyps -Patient will follow-up in the office in 2 to 3 months after EGD completed, she prefers to schedule colonoscopy at a later date

## 2019-02-03 ENCOUNTER — Ambulatory Visit (INDEPENDENT_AMBULATORY_CARE_PROVIDER_SITE_OTHER): Payer: BLUE CROSS/BLUE SHIELD | Admitting: Nurse Practitioner

## 2019-02-03 ENCOUNTER — Encounter (INDEPENDENT_AMBULATORY_CARE_PROVIDER_SITE_OTHER): Payer: Self-pay | Admitting: *Deleted

## 2019-02-03 ENCOUNTER — Encounter (INDEPENDENT_AMBULATORY_CARE_PROVIDER_SITE_OTHER): Payer: Self-pay | Admitting: Nurse Practitioner

## 2019-02-03 ENCOUNTER — Other Ambulatory Visit: Payer: Self-pay

## 2019-02-03 VITALS — BP 108/72 | HR 78 | Temp 97.6°F | Ht 64.0 in | Wt 142.7 lb

## 2019-02-03 DIAGNOSIS — R10811 Right upper quadrant abdominal tenderness: Secondary | ICD-10-CM | POA: Diagnosis not present

## 2019-02-03 DIAGNOSIS — K219 Gastro-esophageal reflux disease without esophagitis: Secondary | ICD-10-CM

## 2019-02-03 DIAGNOSIS — R1013 Epigastric pain: Secondary | ICD-10-CM | POA: Insufficient documentation

## 2019-02-03 DIAGNOSIS — R110 Nausea: Secondary | ICD-10-CM

## 2019-02-03 MED ORDER — SUCRALFATE 1 G PO TABS: 1.0000 g | ORAL_TABLET | Freq: Two times a day (BID) | ORAL | 0 refills | Status: DC

## 2019-02-03 NOTE — Patient Instructions (Addendum)
1. Schedule an upper endoscopy ( EGD)  2. Carafate 1 gm tab one by mouth to be taken 2 hours before lunch time and at 8pm x 10 days  3. Schedule an abdominal ultrasound to evaluate the gallbladder   4. Avoid fatty, spicy foods  5. We discussed scheduling a screening colonoscopy, you prefer to schedule a colonoscopy after EGD completed. Please schedule a follow up appointment in our office in 3 months to further discuss colon cancer screening  6. Call our office if your abdominal pain or nausea worsens

## 2019-02-04 ENCOUNTER — Telehealth: Payer: Self-pay | Admitting: *Deleted

## 2019-02-04 DIAGNOSIS — Z91011 Allergy to milk products: Secondary | ICD-10-CM | POA: Diagnosis not present

## 2019-02-04 DIAGNOSIS — Z79899 Other long term (current) drug therapy: Secondary | ICD-10-CM | POA: Diagnosis not present

## 2019-02-04 DIAGNOSIS — R51 Headache: Secondary | ICD-10-CM | POA: Diagnosis not present

## 2019-02-04 DIAGNOSIS — F329 Major depressive disorder, single episode, unspecified: Secondary | ICD-10-CM | POA: Diagnosis not present

## 2019-02-04 DIAGNOSIS — Z888 Allergy status to other drugs, medicaments and biological substances status: Secondary | ICD-10-CM | POA: Diagnosis not present

## 2019-02-04 DIAGNOSIS — Z91041 Radiographic dye allergy status: Secondary | ICD-10-CM | POA: Diagnosis not present

## 2019-02-04 DIAGNOSIS — Z881 Allergy status to other antibiotic agents status: Secondary | ICD-10-CM | POA: Diagnosis not present

## 2019-02-04 NOTE — Telephone Encounter (Signed)
LMVM for pt to return call about her headaches.

## 2019-02-04 NOTE — Telephone Encounter (Signed)
Pt returned call. Please call back as soon as available.  °

## 2019-02-04 NOTE — Telephone Encounter (Signed)
Patient is having daily headaches.Seen by Debbora Presto Please call her to advise [phone]

## 2019-02-04 NOTE — Telephone Encounter (Signed)
We really need to see her in the office. Please offer her an appt. We can try lower dose of phenergan but she can not take this daily and we will need to treat daily headaches. I recommend she come in to be seen.

## 2019-02-04 NOTE — Telephone Encounter (Signed)
Spoke to pt.  She is having daily headaches.  Went to clinic last wk twice and received toradol/steroids one day and then toradol the other day.  Maxalt is not working.  She is not on an CGRP.  Her level today is 10, with nausea.  Has phenergan supp 25mg  which she states is to strong.  Zofran does not work.  She was given amerge back in 01-03-19 and this did help her.  Attempted to make appt 0730 on 02-06-19 (she lives hour away).

## 2019-02-04 NOTE — Telephone Encounter (Signed)
I called pt and let her know that per AL/NP she needs to see her.  Made appt at 0830 for evaluation on 02-06-19.  For acute help she may need to seek care at urgent care or ED.  She understands.  Her phenergan supp can be cut in half so she is taking 12.5mg  , so that she may not be so groggy in am.  She verbalized understanding.

## 2019-02-05 NOTE — Progress Notes (Signed)
PATIENT: Sabrina Duke DOB: February 09, 1956  REASON FOR VISIT: follow up HISTORY FROM: patient  Chief Complaint  Patient presents with   Follow-up    Worsening migraine. Alone. Rm 1. Patient mentioned that she currently does not have a migraine. She went to ED Tuesday night for her migraine.      HISTORY OF PRESENT ILLNESS: Today 02/06/19 Sabrina Duke is a 63 y.o. female here today for follow up for migraines. She continues to have regular headaches with migrainous features of pounding, nausea, light and sound sensitivity. She has been seen 4 times this month for acute management. She was started on Amovig at last visit in 11/2018. She was not able to tolerate this due to increased constipation. She did not feel that it helped headache. She continues topiramate 150mg  at bedtime. She has been unable to tolerate Inderal, gabapentin, nortriptyline, Depakote, and Imitrex in the past. She has used Maxalt and Emerge for abortive therapy but is not sure they help. She also uses phenergan and compazine for nausea as needed.   HISTORY: (copied from my note on 11/12/2018)  Sabrina Duke is a 63 y.o. female here today for follow up for migraines.  She continues to have regular migraines.  She states that she is going through a full prescription of Maxalt every month.  She does feel that Maxalt tends to help abort migraine.  She does continue topiramate 150 mg at bedtime without adverse effects.  She is uncertain if this is helping.  She reports that migraines are typical to those in the past.  She has no new symptoms.  She has tried multiple medications in the past but has never taken Aimovig.   HISTORY (copied from [facility] note on 02/26/2018)  Sabrina Duke is a 63 year old female with a history of migraine headaches. She returns today for follow-up. He states that her headaches have remained relatively the same. She states that she has 10 severe headaches a month but has at least 15 headache  days a month. Her headaches always occur on the left side. She does have photophobia and phonophobia as well as nausea and vomiting. She takes Maxalt but it does not always resolve the headache. She typically has to take Phenergan and occasionally Compazine to make her go to sleep for headache to resolve. In the past she has tried multiple medications including Inderal, Depakote, gabapentin, nortriptyline and Botox without benefit. She returns today for evaluation.  HISTORYSherry D Cookis a 63 y.o.femalehere as a referral from Dr. Myra Gianotti migraines.Past medical history of migraines, chronic low back pain,depression. She has migraines for decades. She uses 12 maxalt a month, does not use Tylenol or ibuprofen or other medications. She takes Demerol for severe headaches. In a month she has 20 headache days a month on average and 12 are migrainous. Barometric pressure is a trigger.No known other triggers, maybe stress or sleeping. She endorses photophobia/phonophobia. Migraines can last >24 hours. No medication overuse. No aura. Headaches are unilateral, pulsating and pounding. Can be severe. No other focal neurologic deficits, associated symptoms, inciting events or modifiable factors.  Numbness in the leg started 2 months ago, hurts more with leg in a recliner, tingling from the knee to the toe. She has spasms in the back, radicular symptoms, bending over makes it worse with spasms.She has tried conservative measures for 2 months, heat, stretching, exercise, PT, NSAIDs. No relief. Has weakness. It is worsening slowly. No inciting events or trauma.  Reviewed notes, labs and  imaging from outside physicians, which showed:  2013 Myelogram:FINDINGS: AGAIN NOTED PATIENT HAS 6 LUMBAR VERTEBRAL BODIES. THERE IS A MINIMAL VENTRAL DEFECT AT THE LEVEL OF L5/6 SEEN ON LATERAL FILM. THE NERVE ROOTS ARE WELL FILLED WITHOUT EVIDENCE OF COMPRESSION OR DISPLACEMENT. THE CONUS IS NORMAL IN  APPEARANCE.  CONCLUSIONS:  1. SIX LUMBAR VERTEBRAL BODIES. 2. NO EVIDENCE OF NERVE ROOT COMPRESSION OR DISPLACEMENT. 3. SMALL VENTRAL BULGE AT L5/6. APPROVING MD: [name], [name]  Reviewed prior notes, patient was seen in the emergency room in November 2015 complaining of intermittent severe migraine headaches. Localized in her left temple and left cheek described as throbbing. She reported nausea, vomiting, blurred vision, photophobia, phonophobia. She tried Demerol and Compazine with no relief. Felt as though there was fluid in her ears. Headaches improved after IV fluids and medications and she was discharged home.   REVIEW OF SYSTEMS: Out of a complete 14 system review of symptoms, the patient complains only of the following symptoms, dizziness, headache and all other reviewed systems are negative.  ALLERGIES: Allergies  Allergen Reactions   Ciprofloxacin    Dymista [Azelastine-Fluticasone]    Ivp Dye [Iodinated Diagnostic Agents]    Lortab [Hydrocodone-Acetaminophen]    Macrobid [Nitrofurantoin]    Nsaids     Pt can tolerate Aleve "mess my stomach up, have had ulcer in the past"   Other     Darvocet-   Mermantin- "makes me sick on my stomach"  Bio Citrate Calcium with Vitamin D- "hurt stomach"  Antihistamine- "makes me very sleepy no matter when I take them including singulair"     Pepcid [Famotidine]    Prednisone     "makes me hyper and gives me a headache"   Prilosec [Omeprazole]    Vantin [Cefpodoxime]     HOME MEDICATIONS: Outpatient Medications Prior to Visit  Medication Sig Dispense Refill   buPROPion (WELLBUTRIN XL) 150 MG 24 hr tablet Take 1 tablet (150 mg total) by mouth daily. 90 tablet 1   buPROPion (WELLBUTRIN XL) 300 MG 24 hr tablet Take 1 tablet (300 mg total) by mouth daily. 90 tablet 1   Eszopiclone (ESZOPICLONE) 3 MG TABS Take 1 tablet (3 mg total) by mouth at bedtime as needed. Take immediately before bedtime 30 tablet  5   levothyroxine (SYNTHROID) 50 MCG tablet Take 50 mcg by mouth daily before breakfast.     Magnesium 400 MG TABS Take 400 mg by mouth at bedtime.     sucralfate (CARAFATE) 1 g tablet Take 1 tablet (1 g total) by mouth 2 (two) times daily. Do not take within 2 hours of any other medication 20 tablet 0   topiramate (TOPAMAX) 50 MG tablet Take 3 tablets (150 mg total) by mouth at bedtime. 270 tablet 3   rizatriptan (MAXALT) 10 MG tablet Take 1 tablet (10 mg total) by mouth as needed for migraine. May repeat in 2 hours if needed. No more than 2 in 24 hours or 2-3 days per week. 10 tablet 11   dicyclomine (BENTYL) 10 MG capsule Take 1 capsule (10 mg total) by mouth 2 (two) times daily. (Patient not taking: Reported on 01/21/2019) 60 capsule 3   [name] Inject 140 mg into the skin every 30 (thirty) days. (Patient not taking: Reported on 01/21/2019) 3 pen 3   famotidine (PEPCID) 40 MG tablet Take 1 tablet (40 mg total) by mouth daily. (Patient not taking: Reported on 01/21/2019) 30 tablet 3   levothyroxine (SYNTHROID, LEVOTHROID) 25 MCG  tablet 50 mcg daily.   5   naratriptan (AMERGE) 2.5 MG tablet Take 1 tablet (2.5 mg total) by mouth 2 (two) times a day. Take twice daily for 5 days in a row and then stop. (Patient not taking: Reported on 01/21/2019) 10 tablet 0   omeprazole (PRILOSEC) 40 MG capsule Take 1 capsule (40 mg total) by mouth daily. (Patient not taking: Reported on 01/21/2019) 30 capsule 11   pantoprazole (PROTONIX) 40 MG tablet Take 1 tablet (40 mg total) by mouth daily. (Patient not taking: Reported on 01/21/2019) 90 tablet 3   No facility-administered medications prior to visit.     PAST MEDICAL HISTORY: Past Medical History:  Diagnosis Date   Allergic rhinitis    Decreased bone density    Depression    GERD (gastroesophageal reflux disease)    Hypothyroid    Iron deficiency anemia    Low vitamin D level    Migraine     PAST SURGICAL  HISTORY: Past Surgical History:  Procedure Laterality Date   hemroidectomy  [place]   SHOULDER SURGERY Left 11/2017   TUBAL LIGATION  1997   uterine polyp removal  05/2015    FAMILY HISTORY: Family History  Problem Relation Age of Onset   Anxiety disorder Mother    Depression Mother    Bone cancer Mother    OCD Mother    Cerebral aneurysm Maternal Uncle    Drug abuse Brother    Depression Brother     SOCIAL HISTORY: Social History   Socioeconomic History   Marital status: Married    Spouse name: Not on file   Number of children: 1   Years of education: Not on file   Highest education level: High school graduate  Occupational History   Occupation: disabled  Scientist, product/process development strain: Not on file   Food insecurity    Worry: Not on file    Inability: Not on file   Transportation needs    Medical: Not on file    Non-medical: Not on file  Tobacco Use   Smoking status: Former Smoker   Smokeless tobacco: Never Used  Substance and Sexual Activity   Alcohol use: No   Drug use: No   Sexual activity: Not on file  Lifestyle   Physical activity    Days per week: Not on file    Minutes per session: Not on file   Stress: Not on file  Relationships   Social connections    Talks on phone: Not on file    Gets together: Not on file    Attends religious service: Not on file    Active member of club or organization: Not on file    Attends meetings of clubs or organizations: Not on file    Relationship status: Not on file   Intimate partner violence    Fear of current or ex partner: Not on file    Emotionally abused: Not on file    Physically abused: Not on file    Forced sexual activity: Not on file  Other Topics Concern   Not on file  Social History Narrative   Lives at home with husband   Right handed   No caffeine      PHYSICAL EXAM  Vitals:   02/06/19 0829  BP: 117/68  Pulse: 68   Temp: 98 F (36.7 C)  TempSrc: Oral  Weight: 144 lb 12.8 oz (65.7 kg)  Height:  5\' 4"  (1.626 m)   Body mass index is 24.85 kg/m.  Generalized: Well developed, in no acute distress  Cardiology: normal rate and rhythm, no murmur noted Neurological examination  Mentation: Alert oriented to time, place, history taking. Follows all commands speech and language fluent Cranial nerve II-XII: Pupils were equal round reactive to light. Extraocular movements were full, visual field were full on confrontational test. Facial sensation and strength were normal. Uvula tongue midline. Head turning and shoulder shrug  were normal and symmetric. Motor: The motor testing reveals 5 over 5 strength of all 4 extremities. Good symmetric motor tone is noted throughout.  Coordination: Cerebellar testing reveals good finger-nose-finger and heel-to-shin bilaterally.  Gait and station: Gait is normal.   DIAGNOSTIC DATA (LABS, IMAGING, TESTING) - I reviewed patient records, labs, notes, testing and imaging myself where available.  No flowsheet data found.   No results found for: WBC, HGB, HCT, MCV, PLT No results found for: NA, K, CL, CO2, GLUCOSE, BUN, CREATININE, CALCIUM, PROT, ALBUMIN, AST, ALT, ALKPHOS, BILITOT, GFRNONAA, GFRAA No results found for: CHOL, HDL, LDLCALC, LDLDIRECT, TRIG, CHOLHDL No results found for: NWGN5AHGBA1C No results found for: VITAMINB12 No results found for: TSH     ASSESSMENT AND PLAN 63 y.o. year old female  has a past medical history of Allergic rhinitis, Decreased bone density, Depression, GERD (gastroesophageal reflux disease), Hypothyroid, Iron deficiency anemia, Low vitamin D level, and Migraine. here with     ICD-10-CM   1. Chronic migraine without aura without status migrainosus, not intractable  G43.709 Fremanezumab-vfrm (AJOVY) 225 MG/1.5ML SOAJ    [name] continues to have regular migraines.  She was unable to tolerate Imitrex.  We will start Ajovy injections monthly.  I  have educated her on appropriate administration and storage of this medication.  We will also try Nurtec for abortive therapy.  She will stop Maxalt.  She was educated on appropriate administration of Nurtec.  She was advised to only use Phenergan sparingly as needed.  She reports abnormal CBC with Compazine.  She was advised not to use this.  She was encouraged to continue frequent hydration.  Healthy diet and regular exercise advised.  She will follow-up with me in 3 months, sooner if needed.  She verbalizes understanding and agreement with this plan.   No orders of the defined types were placed in this encounter.    Meds ordered this encounter  Medications   Fremanezumab-vfrm (AJOVY) 225 MG/1.5ML SOAJ    Sig: Inject 225 mg into the skin every 30 (thirty) days.    Dispense:  1 pen    Refill:  11    Order Specific Question:   Supervising Provider    Answer:   Anson FretAHERN, ANTONIA B [[id number]]   Rimegepant Sulfate (NURTEC) 75 MG TBDP    Sig: Take 75 mg by mouth daily as needed (take for abortive therapy of migraine, no more than 1 tablet in 24 hours or 10 per month).    Dispense:  10 tablet    Refill:  11    Order Specific Question:   Supervising Provider    Answer:   Anson FretAHERN, ANTONIA B [[id number]]   promethazine (PHENERGAN) 25 MG suppository    Sig: Place 1 suppository (25 mg total) rectally every 6 (six) hours as needed for nausea or vomiting.    Dispense:  12 each    Refill:  0    Order Specific Question:   Supervising Provider    Answer:   Anson FretAHERN, ANTONIA B [  [id number]]      I spent 15 minutes with the patient. 50% of this time was spent counseling and educating patient on plan of care and medications.    Shawnie Dapper, FNP-C 02/06/2019, 9:02 AM [facility] [address] [place], [place] [address] [phone]

## 2019-02-06 ENCOUNTER — Encounter: Payer: Self-pay | Admitting: Family Medicine

## 2019-02-06 ENCOUNTER — Other Ambulatory Visit: Payer: Self-pay

## 2019-02-06 ENCOUNTER — Ambulatory Visit: Payer: BC Managed Care – PPO | Admitting: Family Medicine

## 2019-02-06 VITALS — BP 117/68 | HR 68 | Temp 98.0°F | Ht 64.0 in | Wt 144.8 lb

## 2019-02-06 DIAGNOSIS — G43709 Chronic migraine without aura, not intractable, without status migrainosus: Secondary | ICD-10-CM

## 2019-02-06 MED ORDER — NURTEC 75 MG PO TBDP: 75.0000 mg | ORAL_TABLET | Freq: Every day | ORAL | 11 refills | Status: DC | PRN

## 2019-02-06 MED ORDER — AJOVY 225 MG/1.5ML ~~LOC~~ SOAJ: 225.0000 mg | SUBCUTANEOUS | 11 refills | Status: DC

## 2019-02-06 MED ORDER — PROMETHAZINE HCL 25 MG RE SUPP: 25.0000 mg | Freq: Four times a day (QID) | RECTAL | 0 refills | Status: DC | PRN

## 2019-02-06 NOTE — Patient Instructions (Addendum)
Start Ajovy injections monthly  Stop rizatriptan (Maxalt)   Start Nurtec as needed for migraine abortion  Use phenergan sparingly   Adequate hydration and healthy diet  Follow up in 3 months, sooner   Fremanezumab injection What is this medicine? FREMANEZUMAB (fre ma NEZ ue mab) is used to prevent migraine headaches. This medicine may be used for other purposes; ask your health care provider or pharmacist if you have questions. COMMON BRAND NAME(S): AJOVY What should I tell my health care provider before I take this medicine? They need to know if you have any of these conditions:  an unusual or allergic reaction to fremanezumab, other medicines, foods, dyes, or preservatives  pregnant or trying to get pregnant  breast-feeding How should I use this medicine? This medicine is for injection under the skin. You will be taught how to prepare and give this medicine. Use exactly as directed. Take your medicine at regular intervals. Do not take your medicine more often than directed. It is important that you put your used needles and syringes in a special sharps container. Do not put them in a trash can. If you do not have a sharps container, call your pharmacist or healthcare provider to get one. Talk to your pediatrician regarding the use of this medicine in children. Special care may be needed. Overdosage: If you think you have taken too much of this medicine contact a poison control center or emergency room at once. NOTE: This medicine is only for you. Do not share this medicine with others. What if I miss a dose? If you miss a dose, take it as soon as you can. If it is almost time for your next dose, take only that dose. Do not take double or extra doses. What may interact with this medicine? Interactions are not expected. This list may not describe all possible interactions. Give your health care provider a list of all the medicines, herbs, non-prescription drugs, or dietary  supplements you use. Also tell them if you smoke, drink alcohol, or use illegal drugs. Some items may interact with your medicine. What should I watch for while using this medicine? Tell your doctor or healthcare professional if your symptoms do not start to get better or if they get worse. What side effects may I notice from receiving this medicine? Side effects that you should report to your doctor or health care professional as soon as possible:  allergic reactions like skin rash, itching or hives, swelling of the face, lips, or tongue Side effects that usually do not require medical attention (report these to your doctor or health care professional if they continue or are bothersome):  pain, redness, or irritation at site where injected This list may not describe all possible side effects. Call your doctor for medical advice about side effects. You may report side effects to FDA at 1-800-FDA-1088. Where should I keep my medicine? Keep out of the reach of children. You will be instructed on how to store this medicine. Throw away any unused medicine after the expiration date on the label. NOTE: This sheet is a summary. It may not cover all possible information. If you have questions about this medicine, talk to your doctor, pharmacist, or health care provider.  2020 Elsevier/Gold Standard (2017-02-19 17:22:56) Migraine Headache A migraine headache is a very strong throbbing pain on one side or both sides of your head. This type of headache can also cause other symptoms. It can last from 4 hours to 3 days. Talk with  your doctor about what things may bring on (trigger) this condition. What are the causes? The exact cause of this condition is not known. This condition may be triggered or caused by:  Drinking alcohol.  Smoking.  Taking medicines, such as: ? Medicine used to treat chest pain (nitroglycerin). ? Birth control pills. ? Estrogen. ? Some blood pressure medicines.  Eating or  drinking certain products.  Doing physical activity. Other things that may trigger a migraine headache include:  Having a menstrual period.  Pregnancy.  Hunger.  Stress.  Not getting enough sleep or getting too much sleep.  Weather changes.  Tiredness (fatigue). What increases the risk?  Being 56-31 years old.  Being female.  Having a family history of migraine headaches.  Being Caucasian.  Having depression or anxiety.  Being very overweight. What are the signs or symptoms?  A throbbing pain. This pain may: ? Happen in any area of the head, such as on one side or both sides. ? Make it hard to do daily activities. ? Get worse with physical activity. ? Get worse around bright lights or loud noises.  Other symptoms may include: ? Feeling sick to your stomach (nauseous). ? Vomiting. ? Dizziness. ? Being sensitive to bright lights, loud noises, or smells.  Before you get a migraine headache, you may get warning signs (an aura). An aura may include: ? Seeing flashing lights or having blind spots. ? Seeing bright spots, halos, or zigzag lines. ? Having tunnel vision or blurred vision. ? Having numbness or a tingling feeling. ? Having trouble talking. ? Having weak muscles.  Some people have symptoms after a migraine headache (postdromal phase), such as: ? Tiredness. ? Trouble thinking (concentrating). How is this treated?  Taking medicines that: ? Relieve pain. ? Relieve the feeling of being sick to your stomach. ? Prevent migraine headaches.  Treatment may also include: ? Having acupuncture. ? Avoiding foods that bring on migraine headaches. ? Learning ways to control your body functions (biofeedback). ? Therapy to help you know and deal with negative thoughts (cognitive behavioral therapy). Follow these instructions at home: Medicines  Take over-the-counter and prescription medicines only as told by your doctor.  Ask your doctor if the medicine  prescribed to you: ? Requires you to avoid driving or using heavy machinery. ? Can cause trouble pooping (constipation). You may need to take these steps to prevent or treat trouble pooping:  Drink enough fluid to keep your pee (urine) pale yellow.  Take over-the-counter or prescription medicines.  Eat foods that are high in fiber. These include beans, whole grains, and fresh fruits and vegetables.  Limit foods that are high in fat and sugar. These include fried or sweet foods. Lifestyle  Do not drink alcohol.  Do not use any products that contain nicotine or tobacco, such as cigarettes, e-cigarettes, and chewing tobacco. If you need help quitting, ask your doctor.  Get at least 8 hours of sleep every night.  Limit and deal with stress. General instructions      Keep a journal to find out what may bring on your migraine headaches. For example, write down: ? What you eat and drink. ? How much sleep you get. ? Any change in what you eat or drink. ? Any change in your medicines.  If you have a migraine headache: ? Avoid things that make your symptoms worse, such as bright lights. ? It may help to lie down in a dark, quiet room. ? Do not drive  or use heavy machinery. ? Ask your doctor what activities are safe for you.  Keep all follow-up visits as told by your doctor. This is important. Contact a doctor if:  You get a migraine headache that is different or worse than others you have had.  You have more than 15 headache days in one month. Get help right away if:  Your migraine headache gets very bad.  Your migraine headache lasts longer than 72 hours.  You have a fever.  You have a stiff neck.  You have trouble seeing.  Your muscles feel weak or like you cannot control them.  You start to lose your balance a lot.  You start to have trouble walking.  You pass out (faint).  You have a seizure. Summary  A migraine headache is a very strong throbbing pain on  one side or both sides of your head. These headaches can also cause other symptoms.  This condition may be treated with medicines and changes to your lifestyle.  Keep a journal to find out what may bring on your migraine headaches.  Contact a doctor if you get a migraine headache that is different or worse than others you have had.  Contact your doctor if you have more than 15 headache days in a month. This information is not intended to replace advice given to you by your health care provider. Make sure you discuss any questions you have with your health care provider. Document Released: 02/29/2008 Document Revised: 09/13/2018 Document Reviewed: 07/04/2018 Elsevier Patient Education  2020 [employer]

## 2019-02-07 ENCOUNTER — Other Ambulatory Visit: Payer: Self-pay

## 2019-02-07 ENCOUNTER — Ambulatory Visit (HOSPITAL_COMMUNITY)
Admission: RE | Admit: 2019-02-07 | Discharge: 2019-02-07 | Disposition: A | Discharge status: Home / Self Care | Payer: BC Managed Care – PPO | Source: Ambulatory Visit | Attending: Nurse Practitioner | Admitting: Nurse Practitioner

## 2019-02-07 DIAGNOSIS — K219 Gastro-esophageal reflux disease without esophagitis: Secondary | ICD-10-CM

## 2019-02-07 DIAGNOSIS — R110 Nausea: Secondary | ICD-10-CM | POA: Insufficient documentation

## 2019-02-07 DIAGNOSIS — R10811 Right upper quadrant abdominal tenderness: Secondary | ICD-10-CM | POA: Insufficient documentation

## 2019-02-07 DIAGNOSIS — R1011 Right upper quadrant pain: Secondary | ICD-10-CM | POA: Diagnosis not present

## 2019-02-09 ENCOUNTER — Encounter (INDEPENDENT_AMBULATORY_CARE_PROVIDER_SITE_OTHER): Payer: Self-pay | Admitting: Nurse Practitioner

## 2019-02-10 ENCOUNTER — Encounter (INDEPENDENT_AMBULATORY_CARE_PROVIDER_SITE_OTHER): Payer: Self-pay | Admitting: Nurse Practitioner

## 2019-02-10 NOTE — Progress Notes (Signed)
Made any corrections needed, and agree with history, physical, neuro exam,assessment and plan as stated.     Antonia Ahern, MD [facility]  

## 2019-02-11 ENCOUNTER — Encounter (INDEPENDENT_AMBULATORY_CARE_PROVIDER_SITE_OTHER): Payer: Self-pay | Admitting: Nurse Practitioner

## 2019-02-13 ENCOUNTER — Telehealth (INDEPENDENT_AMBULATORY_CARE_PROVIDER_SITE_OTHER): Payer: Self-pay | Admitting: Nurse Practitioner

## 2019-02-13 ENCOUNTER — Other Ambulatory Visit (INDEPENDENT_AMBULATORY_CARE_PROVIDER_SITE_OTHER): Payer: Self-pay | Admitting: Nurse Practitioner

## 2019-02-13 DIAGNOSIS — R110 Nausea: Secondary | ICD-10-CM

## 2019-02-13 DIAGNOSIS — R1013 Epigastric pain: Secondary | ICD-10-CM

## 2019-02-13 DIAGNOSIS — K219 Gastro-esophageal reflux disease without esophagitis: Secondary | ICD-10-CM

## 2019-02-13 MED ORDER — SUCRALFATE 1 G PO TABS: 1.0000 g | ORAL_TABLET | Freq: Two times a day (BID) | ORAL | 0 refills | Status: DC

## 2019-02-13 NOTE — Telephone Encounter (Signed)
Patient called stated she needs a refill on her Carafate -

## 2019-02-13 NOTE — Telephone Encounter (Signed)
RX sent

## 2019-02-18 DIAGNOSIS — J3089 Other allergic rhinitis: Secondary | ICD-10-CM | POA: Diagnosis not present

## 2019-02-18 DIAGNOSIS — T781XXD Other adverse food reactions, not elsewhere classified, subsequent encounter: Secondary | ICD-10-CM | POA: Diagnosis not present

## 2019-02-18 DIAGNOSIS — J3081 Allergic rhinitis due to animal (cat) (dog) hair and dander: Secondary | ICD-10-CM | POA: Diagnosis not present

## 2019-03-03 ENCOUNTER — Telehealth: Payer: Self-pay | Admitting: Family Medicine

## 2019-03-03 NOTE — Telephone Encounter (Signed)
Pt called in stated that since she has ben on the Rimegepant Sulfate (NURTEC) 75 MG TBDP she has been feeling horrible, she has been using the phenergan with no relief, she states she was placed on a cholesterol med by her pcp and it says side effects are headaches

## 2019-03-04 NOTE — Telephone Encounter (Signed)
I called pt and she stated that she did figure out that the rosuvastatin was causing her headaches, this was new medication for her.  She also could not tolerated nurtec, made her nauseaous.  She had not started the ajovy as she had read the SE and did not want to start this.  She will use maxalt and topamax as ordered.

## 2019-03-07 ENCOUNTER — Other Ambulatory Visit: Payer: Self-pay

## 2019-03-07 ENCOUNTER — Other Ambulatory Visit (HOSPITAL_COMMUNITY)
Admission: RE | Admit: 2019-03-07 | Discharge: 2019-03-07 | Disposition: A | Discharge status: Home / Self Care | Payer: BC Managed Care – PPO | Source: Ambulatory Visit | Attending: Internal Medicine | Admitting: Internal Medicine

## 2019-03-07 ENCOUNTER — Other Ambulatory Visit (HOSPITAL_COMMUNITY): Payer: BC Managed Care – PPO

## 2019-03-07 DIAGNOSIS — Z01812 Encounter for preprocedural laboratory examination: Secondary | ICD-10-CM | POA: Diagnosis not present

## 2019-03-07 DIAGNOSIS — Z20828 Contact with and (suspected) exposure to other viral communicable diseases: Secondary | ICD-10-CM | POA: Diagnosis not present

## 2019-03-07 LAB — SARS CORONAVIRUS 2 (TAT 6-24 HRS): SARS Coronavirus 2: NEGATIVE

## 2019-03-10 ENCOUNTER — Ambulatory Visit (HOSPITAL_COMMUNITY)
Admission: RE | Admit: 2019-03-10 | Discharge: 2019-03-10 | Disposition: A | Discharge status: Home / Self Care | Payer: BC Managed Care – PPO | Attending: Internal Medicine | Admitting: Internal Medicine

## 2019-03-10 ENCOUNTER — Encounter (HOSPITAL_COMMUNITY): Payer: Self-pay | Admitting: *Deleted

## 2019-03-10 ENCOUNTER — Other Ambulatory Visit: Payer: Self-pay

## 2019-03-10 ENCOUNTER — Encounter (HOSPITAL_COMMUNITY)
Admission: RE | Disposition: A | Discharge status: Home / Self Care | Payer: Self-pay | Source: Home / Self Care | Attending: Internal Medicine

## 2019-03-10 DIAGNOSIS — K2100 Gastro-esophageal reflux disease with esophagitis, without bleeding: Secondary | ICD-10-CM | POA: Insufficient documentation

## 2019-03-10 DIAGNOSIS — K2970 Gastritis, unspecified, without bleeding: Secondary | ICD-10-CM | POA: Insufficient documentation

## 2019-03-10 DIAGNOSIS — K219 Gastro-esophageal reflux disease without esophagitis: Secondary | ICD-10-CM | POA: Diagnosis not present

## 2019-03-10 DIAGNOSIS — Z79899 Other long term (current) drug therapy: Secondary | ICD-10-CM | POA: Diagnosis not present

## 2019-03-10 DIAGNOSIS — R1013 Epigastric pain: Secondary | ICD-10-CM | POA: Insufficient documentation

## 2019-03-10 DIAGNOSIS — E039 Hypothyroidism, unspecified: Secondary | ICD-10-CM | POA: Insufficient documentation

## 2019-03-10 DIAGNOSIS — G43909 Migraine, unspecified, not intractable, without status migrainosus: Secondary | ICD-10-CM | POA: Diagnosis not present

## 2019-03-10 DIAGNOSIS — K209 Esophagitis, unspecified without bleeding: Secondary | ICD-10-CM

## 2019-03-10 DIAGNOSIS — Z79890 Hormone replacement therapy (postmenopausal): Secondary | ICD-10-CM | POA: Diagnosis not present

## 2019-03-10 DIAGNOSIS — F329 Major depressive disorder, single episode, unspecified: Secondary | ICD-10-CM | POA: Diagnosis not present

## 2019-03-10 DIAGNOSIS — Z87891 Personal history of nicotine dependence: Secondary | ICD-10-CM | POA: Diagnosis not present

## 2019-03-10 DIAGNOSIS — Z8711 Personal history of peptic ulcer disease: Secondary | ICD-10-CM | POA: Diagnosis not present

## 2019-03-10 HISTORY — PX: BIOPSY: SHX5522

## 2019-03-10 HISTORY — PX: ESOPHAGOGASTRODUODENOSCOPY: SHX5428

## 2019-03-10 SURGERY — EGD (ESOPHAGOGASTRODUODENOSCOPY)
Anesthesia: Moderate Sedation

## 2019-03-10 MED ORDER — MIDAZOLAM HCL 5 MG/5ML IJ SOLN
INTRAMUSCULAR | Status: AC
  Filled 2019-03-10: qty 10

## 2019-03-10 MED ORDER — RABEPRAZOLE SODIUM 20 MG PO TBEC: 20.0000 mg | DELAYED_RELEASE_TABLET | Freq: Every day | ORAL | 5 refills | Status: DC

## 2019-03-10 MED ORDER — MIDAZOLAM HCL 5 MG/5ML IJ SOLN
INTRAMUSCULAR | Status: DC | PRN
  Administered 2019-03-10: 1 mg via INTRAVENOUS
  Administered 2019-03-10: 2 mg via INTRAVENOUS
  Administered 2019-03-10: 1 mg via INTRAVENOUS
  Administered 2019-03-10: 2 mg via INTRAVENOUS

## 2019-03-10 MED ORDER — LIDOCAINE VISCOUS HCL 2 % MT SOLN
OROMUCOSAL | Status: AC
  Filled 2019-03-10: qty 15

## 2019-03-10 MED ORDER — LIDOCAINE VISCOUS HCL 2 % MT SOLN
OROMUCOSAL | Status: DC | PRN
  Administered 2019-03-10: 4 mL via OROMUCOSAL

## 2019-03-10 MED ORDER — MEPERIDINE HCL 50 MG/ML IJ SOLN
INTRAMUSCULAR | Status: AC
  Filled 2019-03-10: qty 1

## 2019-03-10 MED ORDER — STERILE WATER FOR IRRIGATION IR SOLN
Status: DC | PRN
  Administered 2019-03-10: 2.5 mL

## 2019-03-10 MED ORDER — SODIUM CHLORIDE 0.9 % IV SOLN
INTRAVENOUS | Status: DC
  Administered 2019-03-10: 12:00:00 via INTRAVENOUS

## 2019-03-10 MED ORDER — MEPERIDINE HCL 50 MG/ML IJ SOLN
INTRAMUSCULAR | Status: DC | PRN
  Administered 2019-03-10: 25 mg via INTRAVENOUS

## 2019-03-10 NOTE — Op Note (Signed)
[facility] Patient Name: Sabrina Duke Procedure Date: 03/10/2019 1:25 PM MRN: [id number] Date of Birth: 07-24-1955 Attending MD: Lionel December , MD CSN: [id number] Age: 63 Admit Type: Outpatient Procedure:                Upper GI endoscopy Indications:              Epigastric abdominal pain, Follow-up of                            gastro-esophageal reflux disease Providers:                Lionel December, MD, Loma Messing B. Patsy Lager, RN, Pandora Leiter, Technician Referring MD:             Mardella Layman, MD Medicines:                Lidocaine spray, Meperidine 25 mg IV, Midazolam 6                            mg IV Complications:            No immediate complications. Estimated Blood Loss:     Estimated blood loss was minimal. Procedure:                Pre-Anesthesia Assessment:                           - Prior to the procedure, a History and Physical                            was performed, and patient medications and                            allergies were reviewed. The patient's tolerance of                            previous anesthesia was also reviewed. The risks                            and benefits of the procedure and the sedation                            options and risks were discussed with the patient.                            All questions were answered, and informed consent                            was obtained. Prior Anticoagulants: The patient has                            taken no previous anticoagulant or antiplatelet  agents. ASA Grade Assessment: II - A patient with                            mild systemic disease. After reviewing the risks                            and benefits, the patient was deemed in                            satisfactory condition to undergo the procedure.                           After obtaining informed consent, the endoscope was                            passed under  direct vision. Throughout the                            procedure, the patient's blood pressure, pulse, and                            oxygen saturations were monitored continuously. The                            [id number]-[id number] ([id number]) was introduced through the                            mouth, and advanced to the second part of duodenum.                            The upper GI endoscopy was accomplished without                            difficulty. The patient tolerated the procedure                            well. Scope In: 1:55:15 PM Scope Out: 2:02:14 PM Total Procedure Duration: 0 hours 6 minutes 59 seconds  Findings:      The examined esophagus was normal.      Erythema and edema was found at GEJ.      Patchy mild inflammation characterized by congestion (edema) was found       in the gastric antrum. Biopsies were taken with a cold forceps for       histology.      The exam of the stomach was otherwise normal.      The duodenal bulb and second portion of the duodenum were normal. Impression:               - Normal esophagus.                           - Mild esophagitis at GEJ.                           - Gastritis. Biopsied.                           -  Normal duodenal bulb and second portion of the                            duodenum. Moderate Sedation:      Moderate (conscious) sedation was administered by the endoscopy nurse       and supervised by the endoscopist. The following parameters were       monitored: oxygen saturation, heart rate, blood pressure, CO2       capnography and response to care. Total physician intraservice time was       12 minutes. Recommendation:           - Patient has a contact number available for                            emergencies. The signs and symptoms of potential                            delayed complications were discussed with the                            patient. Return to normal activities tomorrow.                             Written discharge instructions were provided to the                            patient.                           - Resume previous diet today.                           - Continue present medications.                           - Use Aciphex (rabeprazole) 20 mg PO daily.                           - No aspirin, ibuprofen, naproxen, or other                            non-steroidal anti-inflammatory drugs for 1 day.                           - Await pathology results. Procedure Code(s):        --- Professional ---                           [id number], Esophagogastroduodenoscopy, flexible,                            transoral; with biopsy, single or multiple                           G0500, Moderate sedation services provided by the  same physician or other qualified health care                            professional performing a gastrointestinal                            endoscopic service that sedation supports,                            requiring the presence of an independent trained                            observer to assist in the monitoring of the                            patient's level of consciousness and physiological                            status; initial 15 minutes of intra-service time;                            patient age 65 years or older (additional time may                            be reported with [phone], as appropriate) Diagnosis Code(s):        --- Professional ---                           K20.9, Esophagitis, unspecified                           K29.70, Gastritis, unspecified, without bleeding                           R10.13, Epigastric pain                           K21.9, Gastro-esophageal reflux disease without                            esophagitis CPT copyright 2019 American Medical Association. All rights reserved. The codes documented in this report are preliminary and upon coder review may  be revised to meet current  compliance requirements. Hildred Laser, MD Hildred Laser, MD 03/10/2019 2:11:59 PM This report has been signed electronically. Number of Addenda: 0

## 2019-03-10 NOTE — Discharge Instructions (Signed)
No aspirin or NSAIDs for 24 hours. Rebaprazole 20 mg by mouth 30 minutes before breakfast.  If you experience headaches he stopped this medication and call office. Resume other medications and diet as before. Resume usual diet. Antireflux sheet. No driving for 24 hours. Patient will call with biopsy results.       Upper Endoscopy, Adult, Care After This sheet gives you information about how to care for yourself after your procedure. Your health care provider may also give you more specific instructions. If you have problems or questions, contact your health care provider. What can I expect after the procedure? After the procedure, it is common to have:  A sore throat.  Mild stomach pain or discomfort.  Bloating.  Nausea. Follow these instructions at home:   Follow instructions from your health care provider about what to eat or drink after your procedure.  Return to your normal activities as told by your health care provider. Ask your health care provider what activities are safe for you.  Take over-the-counter and prescription medicines only as told by your health care provider.  Do not drive for 24 hours if you were given a sedative during your procedure.  Keep all follow-up visits as told by your health care provider. This is important. Contact a health care provider if you have:  A sore throat that lasts longer than one day.  Trouble swallowing. Get help right away if:  You vomit blood or your vomit looks like coffee grounds.  You have: ? A fever. ? Bloody, black, or tarry stools. ? A severe sore throat or you cannot swallow. ? Difficulty breathing. ? Severe pain in your chest or abdomen. Summary  After the procedure, it is common to have a sore throat, mild stomach discomfort, bloating, and nausea.  Do not drive for 24 hours if you were given a sedative during the procedure.  Follow instructions from your health care provider about what to eat or drink  after your procedure.  Return to your normal activities as told by your health care provider. This information is not intended to replace advice given to you by your health care provider. Make sure you discuss any questions you have with your health care provider. Document Released: 11/21/2011 Document Revised: 11/13/2017 Document Reviewed: 10/22/2017 Elsevier Patient Education  2020 [employer] Inc.      Gastroesophageal Reflux Disease, Adult Gastroesophageal reflux (GER) happens when acid from the stomach flows up into the tube that connects the mouth and the stomach (esophagus). Normally, food travels down the esophagus and stays in the stomach to be digested. With GER, food and stomach acid sometimes move back up into the esophagus. You may have a disease called gastroesophageal reflux disease (GERD) if the reflux:  Happens often.  Causes frequent or very bad symptoms.  Causes problems such as damage to the esophagus. When this happens, the esophagus becomes sore and swollen (inflamed). Over time, GERD can make small holes (ulcers) in the lining of the esophagus. What are the causes? This condition is caused by a problem with the muscle between the esophagus and the stomach. When this muscle is weak or not normal, it does not close properly to keep food and acid from coming back up from the stomach. The muscle can be weak because of:  Tobacco use.  Pregnancy.  Having a certain type of hernia (hiatal hernia).  Alcohol use.  Certain foods and drinks, such as coffee, chocolate, onions, and peppermint. What increases the risk? You are  more likely to develop this condition if you:  Are overweight.  Have a disease that affects your connective tissue.  Use NSAID medicines. What are the signs or symptoms? Symptoms of this condition include:  Heartburn.  Difficult or painful swallowing.  The feeling of having a lump in the throat.  A bitter taste in the mouth.  Bad  breath.  Having a lot of saliva.  Having an upset or bloated stomach.  Belching.  Chest pain. Different conditions can cause chest pain. Make sure you see your doctor if you have chest pain.  Shortness of breath or noisy breathing (wheezing).  Ongoing (chronic) cough or a cough at night.  Wearing away of the surface of teeth (tooth enamel).  Weight loss. How is this treated? Treatment will depend on how bad your symptoms are. Your doctor may suggest:  Changes to your diet.  Medicine.  Surgery. Follow these instructions at home: Eating and drinking   Follow a diet as told by your doctor. You may need to avoid foods and drinks such as: ? Coffee and tea (with or without caffeine). ? Drinks that contain alcohol. ? Energy drinks and sports drinks. ? Bubbly (carbonated) drinks or sodas. ? Chocolate and cocoa. ? Peppermint and mint flavorings. ? Garlic and onions. ? Horseradish. ? Spicy and acidic foods. These include peppers, chili powder, curry powder, vinegar, hot sauces, and BBQ sauce. ? Citrus fruit juices and citrus fruits, such as oranges, lemons, and limes. ? Tomato-based foods. These include red sauce, chili, salsa, and pizza with red sauce. ? Fried and fatty foods. These include donuts, french fries, potato chips, and high-fat dressings. ? High-fat meats. These include hot dogs, rib eye steak, sausage, ham, and bacon. ? High-fat dairy items, such as whole milk, butter, and cream cheese.  Eat small meals often. Avoid eating large meals.  Avoid drinking large amounts of liquid with your meals.  Avoid eating meals during the 2-3 hours before bedtime.  Avoid lying down right after you eat.  Do not exercise right after you eat. Lifestyle   Do not use any products that contain nicotine or tobacco. These include cigarettes, e-cigarettes, and chewing tobacco. If you need help quitting, ask your doctor.  Try to lower your stress. If you need help doing this, ask  your doctor.  If you are overweight, lose an amount of weight that is healthy for you. Ask your doctor about a safe weight loss goal. General instructions  Pay attention to any changes in your symptoms.  Take over-the-counter and prescription medicines only as told by your doctor. Do not take aspirin, ibuprofen, or other NSAIDs unless your doctor says it is okay.  Wear loose clothes. Do not wear anything tight around your waist.  Raise (elevate) the head of your bed about 6 inches (15 cm).  Avoid bending over if this makes your symptoms worse.  Keep all follow-up visits as told by your doctor. This is important. Contact a doctor if:  You have new symptoms.  You lose weight and you do not know why.  You have trouble swallowing or it hurts to swallow.  You have wheezing or a cough that keeps happening.  Your symptoms do not get better with treatment.  You have a hoarse voice. Get help right away if:  You have pain in your arms, neck, jaw, teeth, or back.  You feel sweaty, dizzy, or light-headed.  You have chest pain or shortness of breath.  You throw up (vomit) and  your throw-up looks like blood or coffee grounds.  You pass out (faint).  Your poop (stool) is bloody or black.  You cannot swallow, drink, or eat. Summary  If a person has gastroesophageal reflux disease (GERD), food and stomach acid move back up into the esophagus and cause symptoms or problems such as damage to the esophagus.  Treatment will depend on how bad your symptoms are.  Follow a diet as told by your doctor.  Take all medicines only as told by your doctor. This information is not intended to replace advice given to you by your health care provider. Make sure you discuss any questions you have with your health care provider. Document Released: 11/08/2007 Document Revised: 11/28/2017 Document Reviewed: 11/28/2017 Elsevier Patient Education  2020 [employer]

## 2019-03-10 NOTE — H&P (Signed)
Sabrina Duke is an 63 y.o. female.   Chief Complaint: Patient is here for esophagogastroduodenoscopy. HPI: Patient is 63 year old Caucasian female who has chronic GERD but has been intolerant of multiple medications including famotidine, omeprazole, pantoprazole and esomeprazole.  He has developed migraine with these medications.  She has been having daily heartburn.  She also has noted hoarseness.  She is using Tums every day.  She also complains of intermittent epigastric pain which is worse with with food.  She gives history of melena back in February 2020 when she had single episode.  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  No history of weight loss.  She gained 20 pounds in the last few years and she has lost 5 pounds since she has been on thyroid medication. He does not take OTC NSAIDs.  Past Medical History:  Diagnosis Date  . Allergic rhinitis   . Decreased bone density   . Depression   . GERD (gastroesophageal reflux disease)   . Hypothyroid   . Iron deficiency anemia   . Low vitamin D level   . Migraine     Past Surgical History:  Procedure Laterality Date  . hemroidectomy  1990  . OVARIAN CYST SURGERY  1972  . SHOULDER SURGERY Left 11/2017  . TUBAL LIGATION  1997  . uterine polyp removal  05/2015    Family History  Problem Relation Age of Onset  . Anxiety disorder Mother   . Depression Mother   . Bone cancer Mother   . OCD Mother   . Cerebral aneurysm Maternal Uncle   . Drug abuse Brother   . Depression Brother    Social History:  reports that she has quit smoking. She has never used smokeless tobacco. She reports that she does not drink alcohol or use drugs.  Allergies:  Allergies  Allergen Reactions  . Ivp Dye [Iodinated Diagnostic Agents] Anaphylaxis  . Antihistamines, Loratadine-Type Other (See Comments)    Sleepy  . Ciprofloxacin Nausea And Vomiting  .  Dymista [Azelastine-Fluticasone] Other (See Comments)    Heachache  . Lortab [Hydrocodone-Acetaminophen] Nausea And Vomiting  . Macrobid [Nitrofurantoin] Other (See Comments)    Unknown  . Montelukast Other (See Comments)    Sleep  . Nsaids     Pt can tolerate small amounts of Aleve "mess my stomach up, have had ulcer in the past"  . Nurtec [Rimegepant Sulfate] Nausea Only  . Other     Patient not sure about these allergies  Darvocet-   Mermantin- "makes me sick on my stomach"  Bio Citrate Calcium with Vitamin D- "hurt stomach"     . Pepcid [Famotidine] Other (See Comments)    Headache  . Prednisone     "makes me hyper and gives me a headache"  . Prilosec [Omeprazole] Other (See Comments)    Headache  . Rosuvastatin Calcium Other (See Comments)    Headaches.  Varney Baas [Cefpodoxime] Nausea And Vomiting    Medications Prior to Admission  Medication Sig Dispense Refill  . buPROPion (WELLBUTRIN XL) 150 MG 24 hr tablet Take 1 tablet (150 mg total) by mouth daily. (Patient taking differently: Take 150 mg by mouth daily. Takes with 300mg  for a total of 450mg .) 90 tablet 1  . buPROPion (WELLBUTRIN XL) 300 MG 24 hr tablet Take 1 tablet (300 mg total) by mouth daily. (Patient taking differently: Take 300 mg by mouth daily.  Takes with 150mg  for a total of 450mg .) 90 tablet 1  . Cholecalciferol (VITAMIN D-3) 125 MCG (5000 UT) TABS Take 5,000 Units by mouth daily.    . Eszopiclone (ESZOPICLONE) 3 MG TABS Take 1 tablet (3 mg total) by mouth at bedtime as needed. Take immediately before bedtime (Patient taking differently: Take 3 mg by mouth at bedtime. Take immediately before bedtime ) 30 tablet 5  . fluticasone (FLONASE) 50 MCG/ACT nasal spray Place 1 spray into both nostrils daily.    Marland Kitchen levothyroxine (SYNTHROID) 50 MCG tablet Take 50 mcg by mouth daily before breakfast.    . Magnesium 400 MG TABS Take 400 mg by mouth at bedtime.    . sucralfate (CARAFATE) 1 g tablet Take 1 tablet (1 g  total) by mouth 2 (two) times daily. Do not take within 2 hours of any other medication (Patient taking differently: Take 1 g by mouth 2 (two) times daily as needed (acid reflux). Do not take within 2 hours of any other medication) 20 tablet 0  . topiramate (TOPAMAX) 50 MG tablet Take 3 tablets (150 mg total) by mouth at bedtime. 270 tablet 3  . Fremanezumab-vfrm (AJOVY) 225 MG/1.5ML SOAJ Inject 225 mg into the skin every 30 (thirty) days. (Patient not taking: Reported on 03/05/2019) 1 pen 11  . promethazine (PHENERGAN) 25 MG suppository Place 1 suppository (25 mg total) rectally every 6 (six) hours as needed for nausea or vomiting. 12 each 0  . sodium chloride (OCEAN) 0.65 % SOLN nasal spray Place 1 spray into both nostrils every 4 (four) hours as needed for congestion.      No results found for this or any previous visit (from the past 48 hour(s)). No results found.  ROS  Blood pressure 117/62, pulse 77, temperature 97.7 F (36.5 C), temperature source Oral, resp. rate 16, height 5\' 4"  (1.626 m), weight 64.7 kg, SpO2 100 %. Physical Exam  Constitutional: She appears well-developed and well-nourished.  HENT:  Mouth/Throat: Oropharynx is clear and moist.  Eyes: Conjunctivae are normal. No scleral icterus.  Neck: No thyromegaly present.  Cardiovascular: Normal rate, regular rhythm and normal heart sounds.  No murmur heard. Respiratory: Effort normal and breath sounds normal.  GI:  Abdomen is symmetrical soft and nontender with no organomegaly or mass  Musculoskeletal:        General: No edema.  Lymphadenopathy:    She has no cervical adenopathy.  Neurological: She is alert.  Skin: Skin is warm and dry.     Assessment/Plan Chronic GERD and epigastric pain. History of melena(February 2020). Diagnostic esophagogastroduodenoscopy.  Hildred Laser, MD 03/10/2019, 1:45 PM

## 2019-03-11 ENCOUNTER — Other Ambulatory Visit (HOSPITAL_COMMUNITY): Payer: Self-pay

## 2019-03-12 ENCOUNTER — Other Ambulatory Visit: Payer: Self-pay

## 2019-03-12 LAB — SURGICAL PATHOLOGY

## 2019-03-17 ENCOUNTER — Encounter (HOSPITAL_COMMUNITY): Payer: Self-pay | Admitting: Internal Medicine

## 2019-03-17 ENCOUNTER — Telehealth (INDEPENDENT_AMBULATORY_CARE_PROVIDER_SITE_OTHER): Payer: Self-pay | Admitting: Internal Medicine

## 2019-03-17 NOTE — Telephone Encounter (Signed)
Patient called stated the acid reflux medication she was put on is not working - ph# [phone]

## 2019-03-21 NOTE — Telephone Encounter (Signed)
Per Dr.Rehman the patient may try Prevacid 15 mg - she will take 1 by mouth twice daily. Patient was called and a voice message was left.

## 2019-03-27 ENCOUNTER — Telehealth: Payer: Self-pay | Admitting: Psychiatry

## 2019-03-27 NOTE — Telephone Encounter (Signed)
Left voicemail to call back with clarification

## 2019-03-27 NOTE — Telephone Encounter (Signed)
Patient is getting headaches for 3 months while taking the Wellbutrin, patient does not want to come off of medication.  Please advise

## 2019-03-31 NOTE — Telephone Encounter (Signed)
Pt called back and requested that we just disregard this message. Doesn't need a call back at this time.

## 2019-04-04 ENCOUNTER — Telehealth: Payer: Self-pay | Admitting: Psychiatry

## 2019-04-04 NOTE — Telephone Encounter (Signed)
Pt rfequesting Wellbutrin XR 75mg . Stating she wanted to try to see if it would decrease her headaches. Please advise.

## 2019-04-04 NOTE — Telephone Encounter (Signed)
Pt. States she takes a 300 Mg tablet and also a 150 Mg tablet. Pt. Is wanting to change the 150 mg dose to 75 mg? She took 300 Mg and cut the 150 in half and she did not have have any more headaches. Please advise. She is aware that they are extended release and should not cut those in half.

## 2019-04-08 ENCOUNTER — Other Ambulatory Visit: Payer: Self-pay

## 2019-04-08 MED ORDER — BUPROPION HCL ER (SR) 100 MG PO TB12: 100.0000 mg | ORAL_TABLET | Freq: Every day | ORAL | 0 refills | Status: DC

## 2019-04-08 NOTE — Telephone Encounter (Signed)
Rtc to patient and explained the higher risk of side effects with the IR, patient agrees to try Wellbutrin SR 100 mg 1/daily in addition to Wellbutrin XL 300 mg. Updated Rx submitted to [facility] and discontinue 150 mg XL Wellbutrin. Advised to call back with any further issues.

## 2019-05-09 ENCOUNTER — Ambulatory Visit (INDEPENDENT_AMBULATORY_CARE_PROVIDER_SITE_OTHER): Payer: BC Managed Care – PPO | Admitting: Psychiatry

## 2019-05-09 ENCOUNTER — Other Ambulatory Visit: Payer: Self-pay

## 2019-05-09 ENCOUNTER — Encounter

## 2019-05-09 ENCOUNTER — Encounter: Payer: Self-pay | Admitting: Psychiatry

## 2019-05-09 DIAGNOSIS — F411 Generalized anxiety disorder: Secondary | ICD-10-CM | POA: Diagnosis not present

## 2019-05-09 DIAGNOSIS — F331 Major depressive disorder, recurrent, moderate: Secondary | ICD-10-CM

## 2019-05-09 DIAGNOSIS — F5101 Primary insomnia: Secondary | ICD-10-CM

## 2019-05-09 MED ORDER — BUPROPION HCL ER (XL) 300 MG PO TB24: 300.0000 mg | ORAL_TABLET | Freq: Every day | ORAL | 1 refills | Status: DC

## 2019-05-09 MED ORDER — BUPROPION HCL ER (XL) 150 MG PO TB24: 150.0000 mg | ORAL_TABLET | Freq: Every day | ORAL | 1 refills | Status: DC

## 2019-05-09 NOTE — Progress Notes (Signed)
Sabrina BondSherry D Halbig [id number] 09/17/1955 63 y.o.  Virtual Visit via Telephone Note  I connected with pt on 05/09/19 at  4:45 PM EST by telephone and verified that I am speaking with the correct person using two identifiers.   I discussed the limitations, risks, security and privacy concerns of performing an evaluation and management service by telephone and the availability of in person appointments. I also discussed with the patient that there may be a patient responsible charge related to this service. The patient expressed understanding and agreed to proceed.   I discussed the assessment and treatment plan with the patient. The patient was provided an opportunity to ask questions and all were answered. The patient agreed with the plan and demonstrated an understanding of the instructions.   The patient was advised to call back or seek an in-person evaluation if the symptoms worsen or if the condition fails to improve as anticipated.  I provided 25 minutes of non-face-to-face time during this encounter.  The patient was located at home.  [facility]   Corie ChiquitoJessica Mayo Faulk, PMHNP   Subjective:   Patient ID:  Sabrina Duke is a 63 y.o. (DOB 07/23/1955) female.  Chief Complaint: No chief complaint on file.   HPI Sabrina Duke presents for follow-up of depression, anxiety, and insomnia. She had HA's on Wellbutrin XL 450 mg and dose was decreased due to HA's. Pt reports that HA's began with increase in HA's. She reports worsening mood with Wellbutrin XL 300 mg po q am and Wellbutrin SR 100 mg po qd. She reports that for a period of time she was taking Wellbutrin XL 300 mg and 1/2 of a 150 mg tab and was not having side effects, to include HA's, and that her mood was well controlled.    She reports depressed mood. Mild irritability. She reports that her energy and motivation have been very low. Has been having difficulty getting out of bed. She reports that she  is having crying episodes. Denies current HA's. She reports that anxiety has been ok. Sleeping 8-9 hours a night. Appetite has been "too good."  Decreased interest in things. Denies SI.   Taking Lunesta every night.   Past medication trials: Wellbutrin- Effective, well tolerated Trintellix-GI side effects Pristiq Lexapro Celexa Prozac Cymbalta Rexulti Vraylar-jittery, hallucinations Latuda-joint aches Abilify-weight gain Lamictal BuSpar Topamax Trazodone-excessive somnolence Lunesta Ambien-ineffective Adderall XR  Review of Systems:  Review of Systems  Gastrointestinal: Negative.   Musculoskeletal: Negative for gait problem.  Neurological: Negative for tremors and headaches.  Psychiatric/Behavioral:       Please refer to HPI    Medications: I have reviewed the patient's current medications.  Current Outpatient Medications  Medication Sig Dispense Refill  . buPROPion (WELLBUTRIN XL) 300 MG 24 hr tablet Take 1 tablet (300 mg total) by mouth daily. Take with 150 mg tab to equal 450 mg qd 90 tablet 1  . levothyroxine (SYNTHROID) 50 MCG tablet Take 50 mcg by mouth daily before breakfast.    . Magnesium 400 MG TABS Take 400 mg by mouth at bedtime.    . RABEprazole (ACIPHEX) 20 MG tablet Take 1 tablet (20 mg total) by mouth daily before breakfast. 30 tablet 5  . sodium chloride (OCEAN) 0.65 % SOLN nasal spray Place 1 spray into both nostrils every 4 (four) hours as needed for congestion.    . topiramate (TOPAMAX) 50 MG tablet Take 3 tablets (150 mg total) by mouth at bedtime. 270 tablet  3  . buPROPion (WELLBUTRIN XL) 150 MG 24 hr tablet Take 1 tablet (150 mg total) by mouth daily. Take with 300 mg tab to equal 450 mg qd 90 tablet 1  . Eszopiclone (ESZOPICLONE) 3 MG TABS Take 1 tablet (3 mg total) by mouth at bedtime as needed. Take immediately before bedtime (Patient taking differently: Take 3 mg by mouth at bedtime. Take immediately before bedtime ) 30 tablet 5  . promethazine  (PHENERGAN) 25 MG suppository Place 1 suppository (25 mg total) rectally every 6 (six) hours as needed for nausea or vomiting. 12 each 0  . zolmitriptan (ZOMIG-ZMT) 2.5 MG disintegrating tablet      No current facility-administered medications for this visit.     Medication Side Effects: None  Allergies:  Allergies  Allergen Reactions  . Ivp Dye [Iodinated Diagnostic Agents] Anaphylaxis  . Antihistamines, Loratadine-Type Other (See Comments)    Sleepy  . Ciprofloxacin Nausea And Vomiting  . Dymista [Azelastine-Fluticasone] Other (See Comments)    Heachache  . Lortab [Hydrocodone-Acetaminophen] Nausea And Vomiting  . Macrobid [Nitrofurantoin] Other (See Comments)    Unknown  . Montelukast Other (See Comments)    Sleep  . Nsaids     Pt can tolerate small amounts of Aleve "mess my stomach up, have had ulcer in the past"  . Nurtec [Rimegepant Sulfate] Nausea Only  . Other     Patient not sure about these allergies  Darvocet-   Mermantin- "makes me sick on my stomach"  Bio Citrate Calcium with Vitamin D- "hurt stomach"     . Pepcid [Famotidine] Other (See Comments)    Headache  . Prednisone     "makes me hyper and gives me a headache"  . Prilosec [Omeprazole] Other (See Comments)    Headache  . Rosuvastatin Calcium Other (See Comments)    Headaches.  Varney Baas [Cefpodoxime] Nausea And Vomiting    Past Medical History:  Diagnosis Date  . Allergic rhinitis   . Decreased bone density   . Depression   . GERD (gastroesophageal reflux disease)   . Hypothyroid   . Iron deficiency anemia   . Low vitamin D level   . Migraine     Family History  Problem Relation Age of Onset  . Anxiety disorder Mother   . Depression Mother   . Bone cancer Mother   . OCD Mother   . Cerebral aneurysm Maternal Uncle   . Drug abuse Brother   . Depression Brother     Social History   Socioeconomic History  . Marital status: Married    Spouse name: Not on file  . Number of  children: 1  . Years of education: Not on file  . Highest education level: High school graduate  Occupational History  . Occupation: disabled  Social Needs  . Financial resource strain: Not on file  . Food insecurity    Worry: Not on file    Inability: Not on file  . Transportation needs    Medical: Not on file    Non-medical: Not on file  Tobacco Use  . Smoking status: Former Games developer  . Smokeless tobacco: Never Used  Substance and Sexual Activity  . Alcohol use: No  . Drug use: No  . Sexual activity: Not on file  Lifestyle  . Physical activity    Days per week: Not on file    Minutes per session: Not on file  . Stress: Not on file  Relationships  . Social connections  Talks on phone: Not on file    Gets together: Not on file    Attends religious service: Not on file    Active member of club or organization: Not on file    Attends meetings of clubs or organizations: Not on file    Relationship status: Not on file  . Intimate partner violence    Fear of current or ex partner: Not on file    Emotionally abused: Not on file    Physically abused: Not on file    Forced sexual activity: Not on file  Other Topics Concern  . Not on file  Social History Narrative   Lives at home with husband   Right handed   No caffeine    Past Medical History, Surgical history, Social history, and Family history were reviewed and updated as appropriate.   Please see review of systems for further details on the patient's review from today.   Objective:   Physical Exam:  There were no vitals taken for this visit.  Physical Exam Neurological:     Mental Status: She is alert and oriented to person, place, and time.     Cranial Nerves: No dysarthria.  Psychiatric:        Attention and Perception: Attention normal.        Mood and Affect: Mood is depressed.        Speech: Speech normal.        Behavior: Behavior is cooperative.        Thought Content: Thought content normal. Thought  content is not paranoid or delusional. Thought content does not include homicidal or suicidal ideation. Thought content does not include homicidal or suicidal plan.        Cognition and Memory: Cognition and memory normal.        Judgment: Judgment normal.     Comments: Insight intact     Lab Review:  No results found for: NA, K, CL, CO2, GLUCOSE, BUN, CREATININE, CALCIUM, PROT, ALBUMIN, AST, ALT, ALKPHOS, BILITOT, GFRNONAA, GFRAA  No results found for: WBC, RBC, HGB, HCT, PLT, MCV, MCH, MCHC, RDW, LYMPHSABS, MONOABS, EOSABS, BASOSABS  No results found for: POCLITH, LITHIUM   No results found for: PHENYTOIN, PHENOBARB, VALPROATE, CBMZ   .res Assessment: Plan:   Will resume Wellbutrin XL 150 mg 1/2-1 tab daily in combination with Wellbutrin XL 300 mg tablet daily since patient reports that this combination has been most effective and well-tolerated for her depression.  Discussed that Wellbutrin XL is typically not split due to it being an extended release tablet and increased likelihood of side effects when split, however patient adamantly denies any adverse effects with taking half of a tablet in the past and request to resume this regimen. Will continue Lunesta 3 mg at bedtime for chronic insomnia. Patient to follow-up in 2 to 3 months if needed, or may extend follow-up to 6 months if depressive signs and symptoms resolve with resuming previous dosing of Wellbutrin. Patient advised to contact office with any questions, adverse effects, or acute worsening in signs and symptoms.  Diagnoses and all orders for this visit:  Moderate episode of recurrent major depressive disorder (HCC) -     buPROPion (WELLBUTRIN XL) 300 MG 24 hr tablet; Take 1 tablet (300 mg total) by mouth daily. Take with 150 mg tab to equal 450 mg qd -     buPROPion (WELLBUTRIN XL) 150 MG 24 hr tablet; Take 1 tablet (150 mg total) by mouth daily. Take with 300  mg tab to equal 450 mg qd  Primary insomnia  Anxiety  state    Please see After Visit Summary for patient specific instructions.  Future Appointments  Date Time Provider Department Center  05/15/2019  3:00 PM Shawnie Dapper, NP [facility] None  07/24/2019  1:00 PM Corie Chiquito, PMHNP [facility] None    No orders of the defined types were placed in this encounter.     -------------------------------

## 2019-05-12 ENCOUNTER — Telehealth: Payer: Self-pay | Admitting: Family Medicine

## 2019-05-12 NOTE — Telephone Encounter (Signed)
Patient has requested a mychart appointment. Advised to activate her account and to check in 30 min prior. Please follow up.

## 2019-05-13 NOTE — Telephone Encounter (Signed)
She did connect with mychart yesterday.

## 2019-05-15 ENCOUNTER — Telehealth: Payer: Self-pay | Admitting: Family Medicine

## 2019-07-14 ENCOUNTER — Telehealth: Payer: Self-pay | Admitting: Family Medicine

## 2019-07-14 NOTE — Telephone Encounter (Signed)
Noted  

## 2019-07-14 NOTE — Telephone Encounter (Signed)
Pt called to cancel her vv due to having to go back to dentist because of a dental procedure.  Pt will call back to r/s

## 2019-07-24 ENCOUNTER — Encounter: Payer: Self-pay | Admitting: Psychiatry

## 2019-07-24 ENCOUNTER — Ambulatory Visit (INDEPENDENT_AMBULATORY_CARE_PROVIDER_SITE_OTHER): Payer: BC Managed Care – PPO | Admitting: Psychiatry

## 2019-07-24 DIAGNOSIS — F331 Major depressive disorder, recurrent, moderate: Secondary | ICD-10-CM | POA: Diagnosis not present

## 2019-07-24 DIAGNOSIS — F5101 Primary insomnia: Secondary | ICD-10-CM | POA: Diagnosis not present

## 2019-07-24 MED ORDER — BUPROPION HCL ER (XL) 300 MG PO TB24: 300.0000 mg | ORAL_TABLET | Freq: Every day | ORAL | 0 refills | Status: DC

## 2019-07-24 MED ORDER — ESZOPICLONE 3 MG PO TABS: 3.0000 mg | ORAL_TABLET | Freq: Every evening | ORAL | 5 refills | Status: DC | PRN

## 2019-07-24 MED ORDER — BUPROPION HCL ER (XL) 150 MG PO TB24: 150.0000 mg | ORAL_TABLET | Freq: Every day | ORAL | 0 refills | Status: DC

## 2019-07-24 NOTE — Progress Notes (Signed)
Sabrina Duke [id number] 06/05/1956 64 y.o.  Virtual Visit via Video Note  I connected with pt @ on 07/24/19 at  1:00 PM EST by a video enabled telemedicine application and verified that I am speaking with the correct person using two identifiers.   I discussed the limitations of evaluation and management by telemedicine and the availability of in person appointments. The patient expressed understanding and agreed to proceed.  I discussed the assessment and treatment plan with the patient. The patient was provided an opportunity to ask questions and all were answered. The patient agreed with the plan and demonstrated an understanding of the instructions.   The patient was advised to call back or seek an in-person evaluation if the symptoms worsen or if the condition fails to improve as anticipated.  I provided 30 minutes of non-face-to-face time during this encounter.  The patient was located at home.  The provider was located at [facility].   Corie Chiquito, PMHNP   Subjective:   Patient ID:  Sabrina Duke is a 64 y.o. (DOB 06/03/1956) female.  Chief Complaint:  Chief Complaint  Patient presents with  . Anxiety  . Follow-up    h/o Depression, Insomnia    HPI LAKETHIA COPPESS presents for follow-up of depression, anxiety, and insomnia. She reports that she resumed Wellbutrin XL 450 mg daily and has not had HA's. She reports that Wellbutrin XL 450 mg qd seems to be more effective for depression. She reports occasional anxiety and "feeling on edge" at times, and attributes this to being stuck at home with the pandemic and activities being limited. She reports that she is "quicker to fly off the handle than ever."  Has not been able to see her mother in the last year or go to grandchildren's activities. Reports that husband is out working most of the time. She and her husband both had COVID last month. She reports that family had minimal s/s and recovered. Denies depressed mood.  She reports that she will cry more easily than usual but is not having more crying episodes. She reports that she goes to bed around 1 am and gets up around 9-10 am. Falling and staying asleep without difficulty. She reports that she lost 2 lbs recently. Appetite has been ok. She reports that her energy and motivation have been about the same. Reports that she will typically get up and do what she needs to do. Adequate concentration. Denies SI.   Review of Systems:  Review of Systems  Musculoskeletal: Negative for gait problem.  Neurological: Negative for tremors.  Psychiatric/Behavioral:       Please refer to HPI    Medications: I have reviewed the patient's current medications.  Current Outpatient Medications  Medication Sig Dispense Refill  . buPROPion (WELLBUTRIN XL) 150 MG 24 hr tablet Take 1 tablet (150 mg total) by mouth daily. Take with 300 mg tab to equal 450 mg qd 90 tablet 0  . buPROPion (WELLBUTRIN XL) 300 MG 24 hr tablet Take 1 tablet (300 mg total) by mouth daily. Take with 150 mg tab to equal 450 mg qd 90 tablet 0  . [START ON 08/02/2019] Eszopiclone (ESZOPICLONE) 3 MG TABS Take 1 tablet (3 mg total) by mouth at bedtime as needed. Take immediately before bedtime 30 tablet 5  . levothyroxine (SYNTHROID) 50 MCG tablet Take 50 mcg by mouth daily before breakfast.    . Magnesium 400 MG TABS Take 400 mg by mouth at bedtime.    Marland Kitchen  topiramate (TOPAMAX) 50 MG tablet Take 3 tablets (150 mg total) by mouth at bedtime. 270 tablet 3  . promethazine (PHENERGAN) 25 MG suppository Place 1 suppository (25 mg total) rectally every 6 (six) hours as needed for nausea or vomiting. 12 each 0  . RABEprazole (ACIPHEX) 20 MG tablet Take 1 tablet (20 mg total) by mouth daily before breakfast. (Patient not taking: Reported on 07/24/2019) 30 tablet 5  . rizatriptan (MAXALT) 10 MG tablet     . sodium chloride (OCEAN) 0.65 % SOLN nasal spray Place 1 spray into both nostrils every 4 (four) hours as needed for  congestion.    . sucralfate (CARAFATE) 1 g tablet     . zolmitriptan (ZOMIG-ZMT) 2.5 MG disintegrating tablet      No current facility-administered medications for this visit.    Medication Side Effects: None  Allergies:  Allergies  Allergen Reactions  . Ivp Dye [Iodinated Diagnostic Agents] Anaphylaxis  . Antihistamines, Loratadine-Type Other (See Comments)    Sleepy  . Ciprofloxacin Nausea And Vomiting  . Dymista [Azelastine-Fluticasone] Other (See Comments)    Heachache  . Lortab [Hydrocodone-Acetaminophen] Nausea And Vomiting  . Macrobid [Nitrofurantoin] Other (See Comments)    Unknown  . Montelukast Other (See Comments)    Sleep  . Nsaids     Pt can tolerate small amounts of Aleve "mess my stomach up, have had ulcer in the past"  . Nurtec [Rimegepant Sulfate] Nausea Only  . Other     Patient not sure about these allergies  Darvocet-   Mermantin- "makes me sick on my stomach"  Bio Citrate Calcium with Vitamin D- "hurt stomach"     . Pepcid [Famotidine] Other (See Comments)    Headache  . Prednisone     "makes me hyper and gives me a headache"  . Prilosec [Omeprazole] Other (See Comments)    Headache  . Rosuvastatin Calcium Other (See Comments)    Headaches.  Bennie Pierini [Cefpodoxime] Nausea And Vomiting    Past Medical History:  Diagnosis Date  . Allergic rhinitis   . Decreased bone density   . Depression   . GERD (gastroesophageal reflux disease)   . Hypothyroid   . Iron deficiency anemia   . Low vitamin D level   . Migraine     Family History  Problem Relation Age of Onset  . Anxiety disorder Mother   . Depression Mother   . Bone cancer Mother   . OCD Mother   . Cerebral aneurysm Maternal Uncle   . Drug abuse Brother   . Depression Brother     Social History   Socioeconomic History  . Marital status: Married    Spouse name: Not on file  . Number of children: 1  . Years of education: Not on file  . Highest education level: High school  graduate  Occupational History  . Occupation: disabled  Tobacco Use  . Smoking status: Former Research scientist (life sciences)  . Smokeless tobacco: Never Used  Substance and Sexual Activity  . Alcohol use: No  . Drug use: No  . Sexual activity: Not on file  Other Topics Concern  . Not on file  Social History Narrative   Lives at home with husband   Right handed   No caffeine   Social Determinants of Health   Financial Resource Strain:   . Difficulty of Paying Living Expenses: Not on file  Food Insecurity:   . Worried About Charity fundraiser in the Last Year: Not on file  .  Ran Out of Food in the Last Year: Not on file  Transportation Needs:   . Lack of Transportation (Medical): Not on file  . Lack of Transportation (Non-Medical): Not on file  Physical Activity:   . Days of Exercise per Week: Not on file  . Minutes of Exercise per Session: Not on file  Stress:   . Feeling of Stress : Not on file  Social Connections:   . Frequency of Communication with Friends and Family: Not on file  . Frequency of Social Gatherings with Friends and Family: Not on file  . Attends Religious Services: Not on file  . Active Member of Clubs or Organizations: Not on file  . Attends Banker Meetings: Not on file  . Marital Status: Not on file  Intimate Partner Violence:   . Fear of Current or Ex-Partner: Not on file  . Emotionally Abused: Not on file  . Physically Abused: Not on file  . Sexually Abused: Not on file    Past Medical History, Surgical history, Social history, and Family history were reviewed and updated as appropriate.   Please see review of systems for further details on the patient's review from today.   Objective:   Physical Exam:  Wt 143 lb (64.9 kg)   BMI 24.55 kg/m   Physical Exam Constitutional:      General: She is not in acute distress.    Appearance: She is well-developed.  Musculoskeletal:        General: No deformity.  Neurological:     Mental Status: She is  alert and oriented to person, place, and time.     Coordination: Coordination normal.  Psychiatric:        Attention and Perception: Attention and perception normal. She does not perceive auditory or visual hallucinations.        Mood and Affect: Mood normal. Mood is not anxious or depressed. Affect is not labile, blunt, angry or inappropriate.        Speech: Speech normal.        Behavior: Behavior normal.        Thought Content: Thought content normal. Thought content is not paranoid or delusional. Thought content does not include homicidal or suicidal ideation. Thought content does not include homicidal or suicidal plan.        Cognition and Memory: Cognition and memory normal.        Judgment: Judgment normal.     Comments: Insight intact     Lab Review:  No results found for: NA, K, CL, CO2, GLUCOSE, BUN, CREATININE, CALCIUM, PROT, ALBUMIN, AST, ALT, ALKPHOS, BILITOT, GFRNONAA, GFRAA  No results found for: WBC, RBC, HGB, HCT, PLT, MCV, MCH, MCHC, RDW, LYMPHSABS, MONOABS, EOSABS, BASOSABS  No results found for: POCLITH, LITHIUM   No results found for: PHENYTOIN, PHENOBARB, VALPROATE, CBMZ   .res Assessment: Plan:   Discussed that Wellbutrin XL could potentially cause increased irritability. Pt reports that currently benefits are outweighing possible side effects and would like to continue Wellbutrin XL 450 mg po qd for depression.  Continue Lunesta 3 mg po QHS for insomnia.  Pt to f/u in 6 months or sooner if clinically indicated.  Patient advised to contact office with any questions, adverse effects, or acute worsening in signs and symptoms.  Roda was seen today for anxiety and follow-up.  Diagnoses and all orders for this visit:  Primary insomnia -     Eszopiclone (ESZOPICLONE) 3 MG TABS; Take 1 tablet (3 mg total) by  mouth at bedtime as needed. Take immediately before bedtime  Moderate episode of recurrent major depressive disorder (HCC) -     buPROPion (WELLBUTRIN XL)  150 MG 24 hr tablet; Take 1 tablet (150 mg total) by mouth daily. Take with 300 mg tab to equal 450 mg qd -     buPROPion (WELLBUTRIN XL) 300 MG 24 hr tablet; Take 1 tablet (300 mg total) by mouth daily. Take with 150 mg tab to equal 450 mg qd     Please see After Visit Summary for patient specific instructions.  Future Appointments  Date Time Provider Department Center  01/21/2020  1:30 PM Corie Chiquito, PMHNP [facility] None    No orders of the defined types were placed in this encounter.     -------------------------------

## 2019-08-11 ENCOUNTER — Telehealth: Payer: Self-pay | Admitting: Family Medicine

## 2019-08-11 MED ORDER — SUMATRIPTAN SUCCINATE 100 MG PO TABS: 100.0000 mg | ORAL_TABLET | Freq: Once | ORAL | 2 refills | Status: DC | PRN

## 2019-08-11 NOTE — Telephone Encounter (Signed)
Pt called stating she would like to take the imitrix again as the rizatriptan (MAXALT) 10 MG tablet is now causing her to feel sleepy throughout the day

## 2019-08-11 NOTE — Telephone Encounter (Signed)
Nurtec made her nauseous, she is asking for sumatriptan.

## 2019-08-11 NOTE — Telephone Encounter (Signed)
I have refilled Imitrex. She has reported intolerability to this in the past but no allergy. She should only use if needed and no more than 2 tablet in 24 hours or 10 per month. Discontinue Maxalt, zomig and Nurtec.

## 2019-08-26 ENCOUNTER — Other Ambulatory Visit: Payer: Self-pay | Admitting: Psychiatry

## 2019-08-26 DIAGNOSIS — F331 Major depressive disorder, recurrent, moderate: Secondary | ICD-10-CM

## 2019-08-26 MED ORDER — BUPROPION HCL ER (SR) 100 MG PO TB12: 100.0000 mg | ORAL_TABLET | Freq: Every day | ORAL | 0 refills | Status: DC

## 2019-08-26 NOTE — Telephone Encounter (Signed)
Pt called and wants refill on WELLBUTRIN 100 SR. She no longer wants to take 150 MG XL due to the headaches she gets from it. Please send to [facility] [place].

## 2019-09-02 ENCOUNTER — Telehealth: Payer: Self-pay | Admitting: Family Medicine

## 2019-09-02 NOTE — Telephone Encounter (Signed)
I called pt and she is having 7 day migraines,  She has been taking imitrex, and phenergan which helps but it comes back.   Level 10 now.  Has appt with pcp for toradol injection today at 1400.  I asked her if she came in for infusion she said no, she said she is allergic to prednisone.  I do not see this,m she does mention taking medicaion in a taper, (sounds like prednisone).  Please advise.  Is on topamax. Last seen 02-06-19, cancelled last appt due to dental problem.

## 2019-09-02 NOTE — Telephone Encounter (Signed)
Pt called in stating she really needs to speak to the RN due to a Migraine she is suffering at the moment. Please advise.

## 2019-09-02 NOTE — Telephone Encounter (Signed)
She needs follow up to discuss

## 2019-09-02 NOTE — Telephone Encounter (Signed)
I called pt and offered her appt tomorrow with Amy, NP at 0830, she said takes her an hour to get here and caould not make that.  Otherwise would be in May with AL/NP was able to make with MM/NP 09-09-19 at 1400 for worsening migraines.  She sounded much better after her toradol shot with pcp.

## 2019-09-02 NOTE — Telephone Encounter (Signed)
Pt is asking for a call to discuss something being called in for the 7 day migraine she is having.  Please call

## 2019-09-09 ENCOUNTER — Ambulatory Visit: Payer: BC Managed Care – PPO | Admitting: Adult Health

## 2019-09-09 ENCOUNTER — Other Ambulatory Visit: Payer: Self-pay

## 2019-09-09 ENCOUNTER — Encounter: Payer: Self-pay | Admitting: Adult Health

## 2019-09-09 VITALS — BP 102/64 | HR 78 | Temp 97.8°F | Ht 64.0 in | Wt 140.0 lb

## 2019-09-09 DIAGNOSIS — G43709 Chronic migraine without aura, not intractable, without status migrainosus: Secondary | ICD-10-CM

## 2019-09-09 NOTE — Patient Instructions (Signed)
Your Plan:  Continue Topamax and Imitrex Consider Ajovy If your symptoms worsen or you develop new symptoms please let us know.    Thank you for coming to see [place] at [facility]. I hope we have been able to provide you high quality care today.  You may receive a patient satisfaction survey over the next few weeks. We would appreciate your feedback and comments so that we may continue to improve ourselves and the health of our patients.

## 2019-09-09 NOTE — Progress Notes (Addendum)
PATIENT: Sabrina Duke DOB: 11-30-1955  REASON FOR VISIT: follow up HISTORY FROM: patient  HISTORY OF PRESENT ILLNESS: Today 09/09/19:  Sabrina Duke is a 64 year old female with a history of migraine headaches.  She returns today for follow-up.  She reports that she never tried Ajovy.  Reports in the last month she has had 15 headaches.  She states prior to she was having 7-8 headaches a month.  She states that sumatriptan normally resolves her headache in about 30 minutes.  Her headaches always occur on the left side.  She does have photophobia, phonophobia, nausea and vomiting.  She has Phenergan for nausea.  The patient reports that she never tried Ajovy because she was worried about the potential side effects.  She has continued on Topamax 150 mg daily.  Tried and failed: Inderal, gabapentin, nortriptyline, Depakote, Maxalt, Emerge, Botox  HISTORY 02/06/19 Sabrina Duke is a 64 y.o. female here today for follow up for migraines. She continues to have regular headaches with migrainous features of pounding, nausea, light and sound sensitivity. She has been seen 4 times this month for acute management. She was started on Amovig at last visit in 11/2018. She was not able to tolerate this due to increased constipation. She did not feel that it helped headache. She continues topiramate 150mg  at bedtime. She has been unable to tolerate Inderal, gabapentin, nortriptyline, Depakote, and Imitrex in the past. She has used Maxalt and Emerge for abortive therapy but is not sure they help. She also uses phenergan and compazine for nausea as needed  REVIEW OF SYSTEMS: Out of a complete 14 system review of symptoms, the patient complains only of the following symptoms, and all other reviewed systems are negative.  See HPI  ALLERGIES: Allergies  Allergen Reactions  . Ivp Dye [Iodinated Diagnostic Agents] Anaphylaxis  . Antihistamines, Loratadine-Type Other (See Comments)    Sleepy  . Ciprofloxacin  Nausea And Vomiting  . Dymista [Azelastine-Fluticasone] Other (See Comments)    Heachache  . Lortab [Hydrocodone-Acetaminophen] Nausea And Vomiting  . Macrobid [Nitrofurantoin] Other (See Comments)    Unknown  . Montelukast Other (See Comments)    Sleep  . Nsaids     Pt can tolerate small amounts of Aleve "mess my stomach up, have had ulcer in the past"  . Nurtec [Rimegepant Sulfate] Nausea Only  . Other     Patient not sure about these allergies  Darvocet-   Mermantin- "makes me sick on my stomach"  Bio Citrate Calcium with Vitamin D- "hurt stomach"     . Pepcid [Famotidine] Other (See Comments)    Headache  . Prednisone     "makes me hyper and gives me a headache"  . Prilosec [Omeprazole] Other (See Comments)    Headache  . Rosuvastatin Calcium Other (See Comments)    Headaches.  [Cefpodoxime] Nausea And Vomiting    HOME MEDICATIONS: Outpatient Medications Prior to Visit  Medication Sig Dispense Refill  . buPROPion (WELLBUTRIN SR) 100 MG 12 hr tablet Take 1 tablet (100 mg total) by mouth daily. Take in addition to Wellbutrin XL 300 mg 30 tablet 0  . buPROPion (WELLBUTRIN XL) 300 MG 24 hr tablet Take 1 tablet (300 mg total) by mouth daily. Take with 150 mg tab to equal 450 mg qd 90 tablet 0  . Eszopiclone (ESZOPICLONE) 3 MG TABS Take 1 tablet (3 mg total) by mouth at bedtime as needed. Take immediately before bedtime 30 tablet 5  . levothyroxine (SYNTHROID)  50 MCG tablet Take 50 mcg by mouth daily before breakfast.    . Magnesium 400 MG TABS Take 400 mg by mouth at bedtime.    . promethazine (PHENERGAN) 25 MG suppository Place 1 suppository (25 mg total) rectally every 6 (six) hours as needed for nausea or vomiting. 12 each 0  . RABEprazole (ACIPHEX) 20 MG tablet Take 1 tablet (20 mg total) by mouth daily before breakfast. (Patient not taking: Reported on 07/24/2019) 30 tablet 5  . sodium chloride (OCEAN) 0.65 % SOLN nasal spray Place 1 spray into both nostrils  every 4 (four) hours as needed for congestion.    . sucralfate (CARAFATE) 1 g tablet     . SUMAtriptan (IMITREX) 100 MG tablet Take 1 tablet (100 mg total) by mouth once as needed for up to 1 dose for migraine. May repeat in 2 hours if headache persists or recurs. 10 tablet 2  . topiramate (TOPAMAX) 50 MG tablet Take 3 tablets (150 mg total) by mouth at bedtime. 270 tablet 3   No facility-administered medications prior to visit.    PAST MEDICAL HISTORY: Past Medical History:  Diagnosis Date  . Allergic rhinitis   . Decreased bone density   . Depression   . GERD (gastroesophageal reflux disease)   . Hypothyroid   . Iron deficiency anemia   . Low vitamin D level   . Migraine     PAST SURGICAL HISTORY: Past Surgical History:  Procedure Laterality Date  . BIOPSY  03/10/2019   Procedure: BIOPSY;  Surgeon: Malissa Hippo, MD;  Location: [facility];  Service: Endoscopy;;  gastric   . ESOPHAGOGASTRODUODENOSCOPY N/A 03/10/2019   Procedure: ESOPHAGOGASTRODUODENOSCOPY (EGD);  Surgeon: Malissa Hippo, MD;  Location: [facility];  Service: Endoscopy;  Laterality: N/A;  2:40-rescheduled 10/5 @11 :55am per office  . hemroidectomy  1990  . OVARIAN CYST SURGERY  1972  . SHOULDER SURGERY Left 11/2017  . TUBAL LIGATION  1997  . uterine polyp removal  05/2015    FAMILY HISTORY: Family History  Problem Relation Age of Onset  . Anxiety disorder Mother   . Depression Mother   . Bone cancer Mother   . OCD Mother   . Cerebral aneurysm Maternal Uncle   . Drug abuse Brother   . Depression Brother     SOCIAL HISTORY: Social History   Socioeconomic History  . Marital status: Married    Spouse name: Not on file  . Number of children: 1  . Years of education: Not on file  . Highest education level: High school graduate  Occupational History  . Occupation: disabled  Tobacco Use  . Smoking status: Former 06/2015  . Smokeless tobacco: Never Used  Substance and Sexual Activity  .  Alcohol use: No  . Drug use: No  . Sexual activity: Not on file  Other Topics Concern  . Not on file  Social History Narrative   Lives at home with husband   Right handed   No caffeine   Social Determinants of Health   Financial Resource Strain:   . Difficulty of Paying Living Expenses:   Food Insecurity:   . Worried About Games developer in the Last Year:   . Programme researcher, broadcasting/film/video in the Last Year:   Transportation Needs:   . Barista (Medical):   Freight forwarder Lack of Transportation (Non-Medical):   Physical Activity:   . Days of Exercise per Week:   . Minutes of Exercise per Session:  Stress:   . Feeling of Stress :   Social Connections:   . Frequency of Communication with Friends and Family:   . Frequency of Social Gatherings with Friends and Family:   . Attends Religious Services:   . Active Member of Clubs or Organizations:   . Attends Archivist Meetings:   Marland Kitchen Marital Status:   Intimate Partner Violence:   . Fear of Current or Ex-Partner:   . Emotionally Abused:   Marland Kitchen Physically Abused:   . Sexually Abused:       PHYSICAL EXAM  Vitals:   09/09/19 1407  BP: 102/64  Pulse: 78  Temp: 97.8 F (36.6 C)  Weight: 140 lb (63.5 kg)  Height: 5\' 4"  (1.626 m)   Body mass index is 24.03 kg/m.  Generalized: Well developed, in no acute distress   Neurological examination  Mentation: Alert oriented to time, place, history taking. Follows all commands speech and language fluent Cranial nerve II-XII: Pupils were equal round reactive to light. Extraocular movements were full, visual field were full on confrontational test. Head turning and shoulder shrug  were normal and symmetric. Motor: The motor testing reveals 5 over 5 strength of all 4 extremities. Good symmetric motor tone is noted throughout.  Sensory: Sensory testing is intact to soft touch on all 4 extremities. No evidence of extinction is noted.  Coordination: Cerebellar testing reveals good  finger-nose-finger and heel-to-shin bilaterally.  Gait and station: Gait is normal.  Reflexes: Deep tendon reflexes are symmetric and normal bilaterally.   DIAGNOSTIC DATA (LABS, IMAGING, TESTING) - I reviewed patient records, labs, notes, testing and imaging myself where available.     ASSESSMENT AND PLAN 64 y.o. year old female  has a past medical history of Allergic rhinitis, Decreased bone density, Depression, GERD (gastroesophageal reflux disease), Hypothyroid, Iron deficiency anemia, Low vitamin D level, and Migraine. here with:  1.  Migraine headaches  -Continue Topamax 150 mg at bedtime -Continue Imitrex as an abortive therapy -Advised that if she has an ongoing headache we can offer a Depacon infusion or Toradol injection. (Patient cannot tolerate oral prednisone).  -Advised if symptoms worsen or she develops new symptoms she should let us know -Follow-up in 6 months or sooner if needed   I spent 20 minutes of face-to-face and non-face-to-face time with patient.  This included previsit chart review, lab review, study review, order entry, electronic health record documentation, patient education.  Ward Givens, MSN, NP-C 09/09/2019, 2:07 PM [facility] [address] [place], [place] [address] [phone]  Made any corrections needed, and agree with history, physical, neuro exam,assessment and plan as stated.     Sarina Ill, MD [facility]

## 2019-09-19 ENCOUNTER — Ambulatory Visit: Payer: BC Managed Care – PPO | Admitting: Psychiatry

## 2019-09-25 ENCOUNTER — Telehealth: Payer: Self-pay | Admitting: Adult Health

## 2019-09-25 ENCOUNTER — Other Ambulatory Visit: Payer: Self-pay | Admitting: Neurology

## 2019-09-25 MED ORDER — NARATRIPTAN HCL 2.5 MG PO TABS: 2.5000 mg | ORAL_TABLET | Freq: Two times a day (BID) | ORAL | 0 refills | Status: DC

## 2019-09-25 NOTE — Telephone Encounter (Signed)
Offer her a medrol dosepak, that works well. thanks

## 2019-09-25 NOTE — Telephone Encounter (Signed)
She needs visit. We have asked her to follow up several times. She can see someone local for urgent care if headache is not resolving with typical treatments or for any red flag warnings.

## 2019-09-25 NOTE — Telephone Encounter (Signed)
I called pt and relayed to her after speaking to Dr. Lucia Gaskins that she can take amerge 2.5mg  po BID for 5 day.  Called in 10 tabs to see if will brreak cycle. She will call back Monday if still having issues.  She is not to take other triptans when taking amerge.  Pt verbalized understanding. Was thankful for call.

## 2019-09-25 NOTE — Telephone Encounter (Signed)
I called pt.  She has had migraine/headaches for the last week.  Taking imitrex qod for the last 7 days, alternating with BC powder.  She states that will wake doing things then will get headache every day. Imitrex working bvut not getting rid of this.  She is not able to come since distance [place] for infusion or injection.  She states her pain starts in neck (muscle tight/tense) then goes to her back of head, front ofhead).  (taking care of parent stressful).  Been to chiropracter this last tues which has helped previously not so this time.  ? Muscle relaxer .  Please advise.  Ahern pt, last seen megan.NP (seen AL previously).  Does not want to start ajovy due to injectible/SE.

## 2019-09-25 NOTE — Telephone Encounter (Signed)
Patient called stating she has been having headaches for the past couple of days and has taken allergy medications and BC to try and help but neither are working for her and states she lives about an hour away and cannot come to get an injection and would like to know if there is something she can be called in to help. Pt states she is unsure if she should go back on maxalt states the imitrix does not seem to be  working for her.   [facility] - [place], [place] - [address]  [address] [place] [address]

## 2019-09-26 NOTE — Telephone Encounter (Signed)
Pt called wanting to know if the provider agreed to call in a mild muscle relaxer for her. Please advise.

## 2019-09-29 DIAGNOSIS — M545 Low back pain: Secondary | ICD-10-CM | POA: Diagnosis not present

## 2019-09-29 DIAGNOSIS — Z91041 Radiographic dye allergy status: Secondary | ICD-10-CM | POA: Diagnosis not present

## 2019-09-29 DIAGNOSIS — M5441 Lumbago with sciatica, right side: Secondary | ICD-10-CM | POA: Diagnosis not present

## 2019-09-29 DIAGNOSIS — Z91011 Allergy to milk products: Secondary | ICD-10-CM | POA: Diagnosis not present

## 2019-09-29 DIAGNOSIS — Z881 Allergy status to other antibiotic agents status: Secondary | ICD-10-CM | POA: Diagnosis not present

## 2019-09-29 DIAGNOSIS — Z79899 Other long term (current) drug therapy: Secondary | ICD-10-CM | POA: Diagnosis not present

## 2019-09-29 DIAGNOSIS — Z888 Allergy status to other drugs, medicaments and biological substances status: Secondary | ICD-10-CM | POA: Diagnosis not present

## 2019-09-29 DIAGNOSIS — F329 Major depressive disorder, single episode, unspecified: Secondary | ICD-10-CM | POA: Diagnosis not present

## 2019-09-29 NOTE — Telephone Encounter (Signed)
I called pt and I asked her about her headaches and the amerge (she is still taking this bid for 5days since last Thursday).  She is better and no need for muscle relaxant.  I relayed glad she was better.  She will call back as needed.

## 2019-10-10 DIAGNOSIS — M545 Low back pain: Secondary | ICD-10-CM | POA: Diagnosis not present

## 2019-10-20 ENCOUNTER — Encounter: Payer: Self-pay | Admitting: Psychiatry

## 2019-10-20 ENCOUNTER — Ambulatory Visit (INDEPENDENT_AMBULATORY_CARE_PROVIDER_SITE_OTHER): Payer: BC Managed Care – PPO | Admitting: Psychiatry

## 2019-10-20 ENCOUNTER — Other Ambulatory Visit: Payer: Self-pay

## 2019-10-20 DIAGNOSIS — F330 Major depressive disorder, recurrent, mild: Secondary | ICD-10-CM | POA: Diagnosis not present

## 2019-10-20 DIAGNOSIS — F419 Anxiety disorder, unspecified: Secondary | ICD-10-CM

## 2019-10-20 MED ORDER — BUSPIRONE HCL 15 MG PO TABS: ORAL_TABLET | ORAL | 1 refills | Status: DC

## 2019-10-20 MED ORDER — BUPROPION HCL ER (SR) 100 MG PO TB12: 100.0000 mg | ORAL_TABLET | Freq: Two times a day (BID) | ORAL | 1 refills | Status: DC

## 2019-10-20 NOTE — Progress Notes (Signed)
Sabrina Duke [id number] 05/23/1956 64 y.o.  Subjective:   Patient ID:  Sabrina Duke is a 64 y.o. (DOB November 13, 1955) female.  Chief Complaint:  Chief Complaint  Patient presents with  . Anxiety  . Follow-up    Depression    HPI Sabrina Duke presents to the office today for follow-up of depression. She reports that she started having headaches again and stopped Wellbutrin XL and is only taking Wellbutrin SR. She reports that she has HA's if she takes Wellbutrin SR 200 mg. She reports that if she just takes Wellbutrin SR 100 mg po q am she will run out of energy around 4-5 pm. She reports that she was previously gritting her teeth with higher doses of Wellbutrin.   Denies depressed mood or tearfulness. She reports that her energy and motivation have been adequate with Wellbutrin SR. She has been sleeping well from 1 am-10 am. Appetite has been about the same. Concentration has been ok. Denies anhedonia and is looking forward to going to the beach. Denies SI.   She reports that she has been having anxiety, particularly with mother being at end of life and having constant interaction with SNF and concerned that they are not adequately meeting mother's needs. She reports that this causes frustration "because I can't do it myself." She reports frequent worry about her mother and issues related to mother's care. She reports that she will periodically feel very nervous.She reports that she has noticed some increased anxiety and irritation in response to things that usually would not bother her. Denies panic attacks.   Past medication trials: Wellbutrin- Effective, well tolerated Trintellix-GI side effects Pristiq Lexapro Celexa Prozac Cymbalta Rexulti Vraylar-jittery, hallucinations Latuda-joint aches Abilify-weight gain Lamictal BuSpar Topamax Trazodone-excessive somnolence Lunesta Ambien-ineffective Adderall XR    Review of Systems:  Review of Systems  Musculoskeletal: Positive  for back pain. Negative for gait problem.  Neurological: Positive for headaches. Negative for tremors.       Has had worsening headaches and migraines  Psychiatric/Behavioral:       Please refer to HPI    Medications: I have reviewed the patient's current medications.  Current Outpatient Medications  Medication Sig Dispense Refill  . buPROPion (WELLBUTRIN SR) 100 MG 12 hr tablet Take 1 tablet (100 mg total) by mouth 2 (two) times daily. 60 tablet 1  . Eszopiclone (ESZOPICLONE) 3 MG TABS Take 1 tablet (3 mg total) by mouth at bedtime as needed. Take immediately before bedtime 30 tablet 5  . levothyroxine (SYNTHROID) 50 MCG tablet Take 50 mcg by mouth daily before breakfast.    . Magnesium 400 MG TABS Take 400 mg by mouth at bedtime.    . promethazine (PHENERGAN) 25 MG suppository Place 1 suppository (25 mg total) rectally every 6 (six) hours as needed for nausea or vomiting. 12 each 0  . rizatriptan (MAXALT) 10 MG tablet Take 10 mg by mouth as needed for migraine. May repeat in 2 hours if needed    . SUMAtriptan (IMITREX) 100 MG tablet Take 1 tablet (100 mg total) by mouth once as needed for up to 1 dose for migraine. May repeat in 2 hours if headache persists or recurs. 10 tablet 2  . topiramate (TOPAMAX) 50 MG tablet Take 3 tablets (150 mg total) by mouth at bedtime. 270 tablet 3  . busPIRone (BUSPAR) 15 MG tablet Take 1/3 tablet p.o. twice daily for 1 week, then take 2/3 tablet p.o. twice daily for 1 week, then take 1 tablet  p.o. twice daily 60 tablet 1  . naratriptan (AMERGE) 2.5 MG tablet Take 1 tablet (2.5 mg total) by mouth in the morning and at bedtime. (Patient not taking: Reported on 10/20/2019) 10 tablet 0  . sodium chloride (OCEAN) 0.65 % SOLN nasal spray Place 1 spray into both nostrils every 4 (four) hours as needed for congestion.     No current facility-administered medications for this visit.    Medication Side Effects: Headache  Allergies:  Allergies  Allergen Reactions   . Ivp Dye [Iodinated Diagnostic Agents] Anaphylaxis  . Antihistamines, Loratadine-Type Other (See Comments)    Sleepy  . Ciprofloxacin Nausea And Vomiting  . Dymista [Azelastine-Fluticasone] Other (See Comments)    Heachache  . Lortab [Hydrocodone-Acetaminophen] Nausea And Vomiting  . Macrobid [Nitrofurantoin] Other (See Comments)    Unknown  . Montelukast Other (See Comments)    Sleep  . Nsaids     Pt can tolerate small amounts of Aleve "mess my stomach up, have had ulcer in the past"  . Nurtec [Rimegepant Sulfate] Nausea Only  . Other     Patient not sure about these allergies  Darvocet-   Mermantin- "makes me sick on my stomach"  Bio Citrate Calcium with Vitamin D- "hurt stomach"     . Pepcid [Famotidine] Other (See Comments)    Headache  . Prednisone     "makes me hyper and gives me a headache"  . Prilosec [Omeprazole] Other (See Comments)    Headache  . Rosuvastatin Calcium Other (See Comments)    Headaches.  Varney Baas [Cefpodoxime] Nausea And Vomiting    Past Medical History:  Diagnosis Date  . Allergic rhinitis   . Decreased bone density   . Depression   . GERD (gastroesophageal reflux disease)   . Hypothyroid   . Iron deficiency anemia   . Low vitamin D level   . Migraine     Family History  Problem Relation Age of Onset  . Anxiety disorder Mother   . Depression Mother   . Bone cancer Mother   . OCD Mother   . Cerebral aneurysm Maternal Uncle   . Drug abuse Brother   . Depression Brother     Social History   Socioeconomic History  . Marital status: Married    Spouse name: Not on file  . Number of children: 1  . Years of education: Not on file  . Highest education level: High school graduate  Occupational History  . Occupation: disabled  Tobacco Use  . Smoking status: Former Games developer  . Smokeless tobacco: Never Used  Substance and Sexual Activity  . Alcohol use: No  . Drug use: No  . Sexual activity: Not on file  Other Topics Concern   . Not on file  Social History Narrative   Lives at home with husband   Right handed   No caffeine   Social Determinants of Health   Financial Resource Strain:   . Difficulty of Paying Living Expenses:   Food Insecurity:   . Worried About Programme researcher, broadcasting/film/video in the Last Year:   . Barista in the Last Year:   Transportation Needs:   . Freight forwarder (Medical):   Marland Kitchen Lack of Transportation (Non-Medical):   Physical Activity:   . Days of Exercise per Week:   . Minutes of Exercise per Session:   Stress:   . Feeling of Stress :   Social Connections:   . Frequency of Communication with Friends and  Family:   . Frequency of Social Gatherings with Friends and Family:   . Attends Religious Services:   . Active Member of Clubs or Organizations:   . Attends Archivist Meetings:   Marland Kitchen Marital Status:   Intimate Partner Violence:   . Fear of Current or Ex-Partner:   . Emotionally Abused:   Marland Kitchen Physically Abused:   . Sexually Abused:     Past Medical History, Surgical history, Social history, and Family history were reviewed and updated as appropriate.   Please see review of systems for further details on the patient's review from today.   Objective:   Physical Exam:  There were no vitals taken for this visit.  Physical Exam Constitutional:      General: She is not in acute distress. Musculoskeletal:        General: No deformity.  Neurological:     Mental Status: She is alert and oriented to person, place, and time.     Coordination: Coordination normal.  Psychiatric:        Attention and Perception: Attention and perception normal. She does not perceive auditory or visual hallucinations.        Mood and Affect: Mood is anxious. Mood is not depressed. Affect is not labile, blunt, angry or inappropriate.        Speech: Speech normal.        Behavior: Behavior normal.        Thought Content: Thought content normal. Thought content is not paranoid or  delusional. Thought content does not include homicidal or suicidal ideation. Thought content does not include homicidal or suicidal plan.        Cognition and Memory: Cognition and memory normal.        Judgment: Judgment normal.     Comments: Insight intact     Lab Review:  No results found for: NA, K, CL, CO2, GLUCOSE, BUN, CREATININE, CALCIUM, PROT, ALBUMIN, AST, ALT, ALKPHOS, BILITOT, GFRNONAA, GFRAA  No results found for: WBC, RBC, HGB, HCT, PLT, MCV, MCH, MCHC, RDW, LYMPHSABS, MONOABS, EOSABS, BASOSABS  No results found for: POCLITH, LITHIUM   No results found for: PHENYTOIN, PHENOBARB, VALPROATE, CBMZ   .res Assessment: Plan:   Discussed that patient may wish to consider trial of Wellbutrin SR 100 mg in the morning and early afternoon to improve mood signs and symptoms since she has had headaches with higher doses and reports that taking Wellbutrin SR 100 mg in the morning then results in lower energy by late afternoon. Discussed potential benefits, risks, and side effects of BuSpar.  Patient agrees to trial of BuSpar. Start BuSpar 15 mg 1/3 tablet twice daily for 1 week, then increase to 2/3 tablet twice daily for 1 week, then increase to 1 tablet twice daily for anxiety. Continue Lunesta 3 mg at bedtime for insomnia.  Will request that pharmacy fill Lunesta early since patient reports that she will be traveling to the beach for vacation during the week that Johnnye Sima is due to be refilled. Patient to follow-up in 6 weeks or sooner if clinically indicated. Patient advised to contact office with any questions, adverse effects, or acute worsening in signs and symptoms.  Dineen was seen today for anxiety and follow-up.  Diagnoses and all orders for this visit:  Mild episode of recurrent major depressive disorder ([place]) -     buPROPion (WELLBUTRIN SR) 100 MG 12 hr tablet; Take 1 tablet (100 mg total) by mouth 2 (two) times daily.  Anxiety -  busPIRone (BUSPAR) 15 MG tablet; Take  1/3 tablet p.o. twice daily for 1 week, then take 2/3 tablet p.o. twice daily for 1 week, then take 1 tablet p.o. twice daily     Please see After Visit Summary for patient specific instructions.  Future Appointments  Date Time Provider Department Center  12/01/2019  2:00 PM Corie Chiquito, PMHNP [facility] None  01/21/2020  1:30 PM Corie Chiquito, PMHNP [facility] None  03/10/2020  2:30 PM Lomax, Amy, NP [facility] None    No orders of the defined types were placed in this encounter.   -------------------------------

## 2019-10-21 ENCOUNTER — Telehealth: Payer: Self-pay

## 2019-10-21 NOTE — Telephone Encounter (Signed)
Contacted [facility] [phone] to allow an early refill on 10/25/2019 for patient's Lunesta 3 mg. Patient going on vacation. Pharmacist said no problem.

## 2019-11-06 ENCOUNTER — Telehealth: Payer: Self-pay | Admitting: Adult Health

## 2019-11-06 NOTE — Telephone Encounter (Signed)
Pt called stating that her headaches have not improved and would like to go ahead and try the Ajovy that was mentioned to her. Please advised.

## 2019-11-07 DIAGNOSIS — M545 Low back pain: Secondary | ICD-10-CM | POA: Diagnosis not present

## 2019-11-10 MED ORDER — AJOVY 225 MG/1.5ML ~~LOC~~ SOAJ: 225.0000 mg | SUBCUTANEOUS | 5 refills | Status: DC

## 2019-11-10 NOTE — Telephone Encounter (Signed)
Noted  

## 2019-11-10 NOTE — Telephone Encounter (Signed)
Pt has called to report that she no longer needs the injection called in for her headache.  Pt states her psychiatrist called in something for stress and it has helped with her headaches.

## 2019-11-10 NOTE — Telephone Encounter (Signed)
Order sent.

## 2019-11-11 NOTE — Telephone Encounter (Signed)
I did not cancel the ajovy, as the pharmacy will file for if needed in future.

## 2019-11-14 DIAGNOSIS — M810 Age-related osteoporosis without current pathological fracture: Secondary | ICD-10-CM | POA: Diagnosis not present

## 2019-11-14 DIAGNOSIS — M4854XA Collapsed vertebra, not elsewhere classified, thoracic region, initial encounter for fracture: Secondary | ICD-10-CM | POA: Diagnosis not present

## 2019-12-01 ENCOUNTER — Telehealth (HOSPITAL_COMMUNITY): Payer: Self-pay

## 2019-12-01 ENCOUNTER — Ambulatory Visit: Payer: BC Managed Care – PPO | Admitting: Psychiatry

## 2019-12-01 NOTE — Telephone Encounter (Signed)
Called to schedule consult. Informed pt that we needed a copy of her MRI. Pt stated that she is already seeing someone else regarding her fracture. Will cancel referral. AW

## 2019-12-02 DIAGNOSIS — S22080A Wedge compression fracture of T11-T12 vertebra, initial encounter for closed fracture: Secondary | ICD-10-CM | POA: Diagnosis not present

## 2019-12-03 ENCOUNTER — Other Ambulatory Visit: Payer: Self-pay | Admitting: *Deleted

## 2019-12-03 MED ORDER — SUMATRIPTAN SUCCINATE 100 MG PO TABS: 100.0000 mg | ORAL_TABLET | Freq: Once | ORAL | 2 refills | Status: DC | PRN

## 2019-12-19 ENCOUNTER — Telehealth: Payer: Self-pay | Admitting: Psychiatry

## 2019-12-19 DIAGNOSIS — F419 Anxiety disorder, unspecified: Secondary | ICD-10-CM

## 2019-12-19 MED ORDER — FLUOXETINE HCL 20 MG PO CAPS: 20.0000 mg | ORAL_CAPSULE | Freq: Every day | ORAL | 1 refills | Status: DC

## 2019-12-19 NOTE — Telephone Encounter (Signed)
Patient called and said that she gained 3 lbs on the buspar and she stopped it on Tuesday . She would like to go back on prozac which she took in the past for her anxiety. Please give her a call at [phone].

## 2019-12-19 NOTE — Telephone Encounter (Signed)
Patient aware and had no questions or concerns 

## 2019-12-27 ENCOUNTER — Other Ambulatory Visit: Payer: Self-pay | Admitting: Psychiatry

## 2019-12-27 DIAGNOSIS — F330 Major depressive disorder, recurrent, mild: Secondary | ICD-10-CM

## 2020-01-16 ENCOUNTER — Encounter (HOSPITAL_COMMUNITY): Payer: Self-pay | Admitting: Family Medicine

## 2020-01-16 ENCOUNTER — Other Ambulatory Visit: Payer: Self-pay

## 2020-01-16 ENCOUNTER — Inpatient Hospital Stay (HOSPITAL_COMMUNITY)
Admission: EM | Admit: 2020-01-16 | Discharge: 2020-01-20 | DRG: 392 | Disposition: A | Discharge status: Home / Self Care | Payer: BC Managed Care – PPO | Attending: Internal Medicine | Admitting: Internal Medicine

## 2020-01-16 ENCOUNTER — Emergency Department (HOSPITAL_COMMUNITY): Payer: BC Managed Care – PPO

## 2020-01-16 DIAGNOSIS — D72819 Decreased white blood cell count, unspecified: Secondary | ICD-10-CM | POA: Diagnosis present

## 2020-01-16 DIAGNOSIS — F329 Major depressive disorder, single episode, unspecified: Secondary | ICD-10-CM | POA: Diagnosis not present

## 2020-01-16 DIAGNOSIS — N390 Urinary tract infection, site not specified: Secondary | ICD-10-CM | POA: Diagnosis present

## 2020-01-16 DIAGNOSIS — G43911 Migraine, unspecified, intractable, with status migrainosus: Secondary | ICD-10-CM | POA: Diagnosis present

## 2020-01-16 DIAGNOSIS — Z87891 Personal history of nicotine dependence: Secondary | ICD-10-CM

## 2020-01-16 DIAGNOSIS — Z886 Allergy status to analgesic agent status: Secondary | ICD-10-CM | POA: Diagnosis not present

## 2020-01-16 DIAGNOSIS — A059 Bacterial foodborne intoxication, unspecified: Principal | ICD-10-CM | POA: Diagnosis present

## 2020-01-16 DIAGNOSIS — R197 Diarrhea, unspecified: Secondary | ICD-10-CM | POA: Diagnosis not present

## 2020-01-16 DIAGNOSIS — K567 Ileus, unspecified: Secondary | ICD-10-CM | POA: Diagnosis present

## 2020-01-16 DIAGNOSIS — Z888 Allergy status to other drugs, medicaments and biological substances status: Secondary | ICD-10-CM

## 2020-01-16 DIAGNOSIS — K219 Gastro-esophageal reflux disease without esophagitis: Secondary | ICD-10-CM | POA: Diagnosis present

## 2020-01-16 DIAGNOSIS — R1032 Left lower quadrant pain: Secondary | ICD-10-CM

## 2020-01-16 DIAGNOSIS — Z91041 Radiographic dye allergy status: Secondary | ICD-10-CM | POA: Diagnosis not present

## 2020-01-16 DIAGNOSIS — Z79899 Other long term (current) drug therapy: Secondary | ICD-10-CM | POA: Diagnosis not present

## 2020-01-16 DIAGNOSIS — Z8719 Personal history of other diseases of the digestive system: Secondary | ICD-10-CM

## 2020-01-16 DIAGNOSIS — Z20822 Contact with and (suspected) exposure to covid-19: Secondary | ICD-10-CM | POA: Diagnosis present

## 2020-01-16 DIAGNOSIS — K5939 Other megacolon: Secondary | ICD-10-CM | POA: Diagnosis not present

## 2020-01-16 DIAGNOSIS — E559 Vitamin D deficiency, unspecified: Secondary | ICD-10-CM | POA: Diagnosis not present

## 2020-01-16 DIAGNOSIS — E039 Hypothyroidism, unspecified: Secondary | ICD-10-CM | POA: Diagnosis present

## 2020-01-16 DIAGNOSIS — I700 Atherosclerosis of aorta: Secondary | ICD-10-CM | POA: Diagnosis not present

## 2020-01-16 DIAGNOSIS — G43811 Other migraine, intractable, with status migrainosus: Secondary | ICD-10-CM | POA: Diagnosis not present

## 2020-01-16 DIAGNOSIS — K6389 Other specified diseases of intestine: Secondary | ICD-10-CM | POA: Diagnosis not present

## 2020-01-16 DIAGNOSIS — Z881 Allergy status to other antibiotic agents status: Secondary | ICD-10-CM

## 2020-01-16 DIAGNOSIS — Z79890 Hormone replacement therapy (postmenopausal): Secondary | ICD-10-CM

## 2020-01-16 DIAGNOSIS — D696 Thrombocytopenia, unspecified: Secondary | ICD-10-CM | POA: Diagnosis not present

## 2020-01-16 LAB — CBC WITH DIFFERENTIAL/PLATELET
Abs Immature Granulocytes: 0.01 10*3/uL (ref 0.00–0.07)
Basophils Absolute: 0 10*3/uL (ref 0.0–0.1)
Basophils Relative: 1 %
Eosinophils Absolute: 0.1 10*3/uL (ref 0.0–0.5)
Eosinophils Relative: 2 %
HCT: 40.4 % (ref 36.0–46.0)
Hemoglobin: 13.2 g/dL (ref 12.0–15.0)
Immature Granulocytes: 0 %
Lymphocytes Relative: 31 %
Lymphs Abs: 1 10*3/uL (ref 0.7–4.0)
MCH: 29.5 pg (ref 26.0–34.0)
MCHC: 32.7 g/dL (ref 30.0–36.0)
MCV: 90.4 fL (ref 80.0–100.0)
Monocytes Absolute: 0.3 10*3/uL (ref 0.1–1.0)
Monocytes Relative: 9 %
Neutro Abs: 1.8 10*3/uL (ref 1.7–7.7)
Neutrophils Relative %: 57 %
Platelets: 112 10*3/uL — ABNORMAL LOW (ref 150–400)
RBC: 4.47 MIL/uL (ref 3.87–5.11)
RDW: 12.5 % (ref 11.5–15.5)
WBC: 3.1 10*3/uL — ABNORMAL LOW (ref 4.0–10.5)
nRBC: 0 % (ref 0.0–0.2)

## 2020-01-16 LAB — URINALYSIS, ROUTINE W REFLEX MICROSCOPIC
Bacteria, UA: NONE SEEN
Bilirubin Urine: NEGATIVE
Glucose, UA: NEGATIVE mg/dL
Hgb urine dipstick: NEGATIVE
Ketones, ur: NEGATIVE mg/dL
Nitrite: NEGATIVE
Protein, ur: NEGATIVE mg/dL
Specific Gravity, Urine: 1.018 (ref 1.005–1.030)
pH: 6 (ref 5.0–8.0)

## 2020-01-16 LAB — COMPREHENSIVE METABOLIC PANEL
ALT: 16 U/L (ref 0–44)
AST: 19 U/L (ref 15–41)
Albumin: 4.1 g/dL (ref 3.5–5.0)
Alkaline Phosphatase: 69 U/L (ref 38–126)
Anion gap: 8 (ref 5–15)
BUN: 20 mg/dL (ref 8–23)
CO2: 19 mmol/L — ABNORMAL LOW (ref 22–32)
Calcium: 8.9 mg/dL (ref 8.9–10.3)
Chloride: 112 mmol/L — ABNORMAL HIGH (ref 98–111)
Creatinine, Ser: 1.16 mg/dL — ABNORMAL HIGH (ref 0.44–1.00)
GFR calc Af Amer: 58 mL/min — ABNORMAL LOW (ref >60)
GFR calc non Af Amer: 50 mL/min — ABNORMAL LOW (ref >60)
Glucose, Bld: 97 mg/dL (ref 70–99)
Potassium: 3.9 mmol/L (ref 3.5–5.1)
Sodium: 139 mmol/L (ref 135–145)
Total Bilirubin: 0.4 mg/dL (ref 0.3–1.2)
Total Protein: 6.6 g/dL (ref 6.5–8.1)

## 2020-01-16 LAB — LIPASE, BLOOD: Lipase: 25 U/L (ref 11–51)

## 2020-01-16 LAB — SARS CORONAVIRUS 2 BY RT PCR (HOSPITAL ORDER, PERFORMED IN ~~LOC~~ HOSPITAL LAB): SARS Coronavirus 2: NEGATIVE

## 2020-01-16 MED ORDER — FENTANYL CITRATE (PF) 100 MCG/2ML IJ SOLN
50.0000 ug | Freq: Once | INTRAMUSCULAR | Status: AC
  Administered 2020-01-16: 50 ug via INTRAVENOUS
  Filled 2020-01-16: qty 2

## 2020-01-16 MED ORDER — MORPHINE SULFATE (PF) 4 MG/ML IV SOLN
4.0000 mg | Freq: Once | INTRAVENOUS | Status: AC
  Administered 2020-01-16: 4 mg via INTRAVENOUS
  Filled 2020-01-16: qty 1

## 2020-01-16 MED ORDER — BACLOFEN 10 MG PO TABS
10.0000 mg | ORAL_TABLET | Freq: Two times a day (BID) | ORAL | Status: DC
  Administered 2020-01-17: 10 mg via ORAL
  Filled 2020-01-16 (×6): qty 1

## 2020-01-16 MED ORDER — FLUOXETINE HCL 20 MG PO CAPS
20.0000 mg | ORAL_CAPSULE | Freq: Every day | ORAL | Status: DC
  Filled 2020-01-16 (×4): qty 1

## 2020-01-16 MED ORDER — OXYCODONE HCL 5 MG PO TABS: 5.0000 mg | ORAL_TABLET | ORAL | Status: DC | PRN

## 2020-01-16 MED ORDER — PROMETHAZINE HCL 12.5 MG PO TABS
12.5000 mg | ORAL_TABLET | Freq: Four times a day (QID) | ORAL | Status: DC | PRN
  Administered 2020-01-16: 12.5 mg via ORAL
  Filled 2020-01-16 (×2): qty 1

## 2020-01-16 MED ORDER — BUPROPION HCL ER (SR) 100 MG PO TB12
100.0000 mg | ORAL_TABLET | Freq: Two times a day (BID) | ORAL | Status: DC
  Administered 2020-01-17 – 2020-01-20 (×5): 100 mg via ORAL
  Filled 2020-01-16 (×13): qty 1

## 2020-01-16 MED ORDER — LEVOTHYROXINE SODIUM 50 MCG PO TABS
50.0000 ug | ORAL_TABLET | Freq: Every day | ORAL | Status: DC
  Administered 2020-01-17 – 2020-01-20 (×4): 50 ug via ORAL
  Filled 2020-01-16 (×5): qty 1

## 2020-01-16 MED ORDER — FENTANYL CITRATE (PF) 100 MCG/2ML IJ SOLN
25.0000 ug | Freq: Once | INTRAMUSCULAR | Status: AC
  Administered 2020-01-16: 25 ug via INTRAVENOUS
  Filled 2020-01-16: qty 2

## 2020-01-16 MED ORDER — ONDANSETRON HCL 4 MG/2ML IJ SOLN
4.0000 mg | Freq: Once | INTRAMUSCULAR | Status: DC
  Filled 2020-01-16: qty 2

## 2020-01-16 MED ORDER — SODIUM CHLORIDE 0.9 % IV SOLN: INTRAVENOUS | Status: AC

## 2020-01-16 MED ORDER — ZOLPIDEM TARTRATE 5 MG PO TABS
5.0000 mg | ORAL_TABLET | Freq: Every evening | ORAL | Status: DC | PRN
  Administered 2020-01-16: 5 mg via ORAL
  Filled 2020-01-16: qty 1

## 2020-01-16 MED ORDER — MORPHINE SULFATE (PF) 2 MG/ML IV SOLN: 2.0000 mg | Freq: Once | INTRAVENOUS | Status: DC

## 2020-01-16 MED ORDER — ZOLPIDEM TARTRATE 5 MG PO TABS
5.0000 mg | ORAL_TABLET | Freq: Every evening | ORAL | Status: DC | PRN
  Administered 2020-01-17 – 2020-01-19 (×3): 5 mg via ORAL
  Filled 2020-01-16 (×3): qty 1

## 2020-01-16 MED ORDER — SODIUM CHLORIDE 0.9 % IV BOLUS
1000.0000 mL | Freq: Once | INTRAVENOUS | Status: AC
  Administered 2020-01-16: 1000 mL via INTRAVENOUS

## 2020-01-16 MED ORDER — LEVOTHYROXINE SODIUM 50 MCG PO TABS: 50.0000 ug | ORAL_TABLET | Freq: Every day | ORAL | Status: DC

## 2020-01-16 MED ORDER — TOPIRAMATE 25 MG PO TABS
150.0000 mg | ORAL_TABLET | Freq: Every day | ORAL | Status: DC
  Administered 2020-01-16 – 2020-01-19 (×4): 150 mg via ORAL
  Filled 2020-01-16: qty 6
  Filled 2020-01-16 (×3): qty 2

## 2020-01-16 NOTE — ED Provider Notes (Signed)
[facility]GENCY [facility]PARTMENT Provider Note   CSN: [id number] Arrival date & time: 01/16/20  1105     History Chief Complaint  Patient presents with  . Flank Pain    Sabrina Duke is a 64 y.o. female.  HPI      Sabrina Duke is a 64 y.o. female with history of thrombocytopenia and leukopenia, [place] hemat[place] in [place].  She presents to the Emergency Department complaining of left lower abdominal pain that began 3 days ago.  She describes the pain as sharp and constant.  She reports having diarrhea x2 weeks and was seen by her PCP in [place].  She states that she had stool cultures and lab work done and her stool cultures and C. difficile testing were negative.  This week, diarrhea has lessened.  She contacted her PCP and was advised to come to the emergency department for further evaluation of her abdominal pain.  She denies bloody stools, fever, and vomiting.  She is currently taking penicillin prescribed by her PCP for a UTI.  Had covid earlier this year   Past Medical History:  Diagnosis Date  . Allergic rhinitis   . Decreased bone density   . Depression   . GERD (gastroesophageal reflux disease)   . Hypothyroid   . Iron deficiency anemia   . Low vitamin D level   . Migraine     Patient Active Problem List   Diagnosis Date Noted  . Abdominal pain, epigastric 02/03/2019  . Nausea without vomiting 02/03/2019  . Acid reflux 02/03/2019  . Anxiety state 04/09/2018  . Migraine, unspecified, intractable, with status migrainosus 04/09/2018  . Insomnia 04/09/2018  . Chronic migraine without aura without status migrainosus, not intractable 07/02/2017    Past Surgical History:  Procedure Laterality Date  . BIOPSY  03/10/2019   Procedure: BIOPSY;  Surgeon: Malissa Hippoehman, Najeeb U, MD;  Location: [facility];  Service: Endoscopy;;  gastric   . ESOPHAGOGASTRODUODENOSCOPY N/A 03/10/2019   Procedure: ESOPHAGOGASTRODUODENOSCOPY (EGD);  Surgeon: Malissa Hippoehman, Najeeb U,  MD;  Location: [facility];  Service: Endoscopy;  Laterality: N/A;  2:40-rescheduled 10/5 @11 :55am per office  . hemroidectomy  1990  . OVARIAN CYST SURGERY  1972  . SHOULDER SURGERY Left 11/2017  . TUBAL LIGATION  1997  . uterine polyp removal  05/2015     OB History   No obstetric history on file.     Family History  Problem Relation Age of Onset  . Anxiety disorder Mother   . Depression Mother   . Bone cancer Mother   . OCD Mother   . Cerebral aneurysm Maternal Uncle   . Drug abuse Brother   . Depression Brother     Social History   Tobacco Use  . Smoking status: Former Games developermoker  . Smokeless tobacco: Never Used  Vaping Use  . Vaping Use: Never used  Substance Use Topics  . Alcohol use: No  . Drug use: No    Home Medications Prior to Admission medications   Medication Sig Start Date End Date Taking? Authorizing Provider  baclofen (LIORESAL) 10 MG tablet Take 10 mg by mouth 2 (two) times daily. 01/14/20  Yes [provider]  penicillin v potassium (VEETID) 500 MG tablet Take 500 mg by mouth 2 (two) times daily. 01/14/20  Yes [provider]  sulfamethoxazole-trimethoprim (BACTRIM DS) 800-160 MG tablet Take 1 tablet by mouth 2 (two) times daily. 01/13/20  Yes [provider]  Vitamin D, Ergocalciferol, (DRISDOL) 1.25  MG (50000 UNIT) CAPS capsule Take 50,000 Units by mouth once a week. 12/24/19  Yes [provider]  buPROPion (WELLBUTRIN SR) 100 MG 12 hr tablet TAKE 1 TABLET BY MOUTHTWICE A DAY. 12/29/19   Corie Chiquito, PMHNP  Eszopiclone (ESZOPICLONE) 3 MG TABS Take 1 tablet (3 mg total) by mouth at bedtime as needed. Take immediately before bedtime 08/02/19   Corie Chiquito, PMHNP  FLUoxetine (PROZAC) 20 MG capsule Take 1 capsule (20 mg total) by mouth daily. 12/19/19   Corie Chiquito, PMHNP  Fremanezumab-vfrm (AJOVY) 225 MG/1.5ML SOAJ Inject 225 mg into the skin every 30 (thirty) days. 11/10/19   Butch Penny, NP  levothyroxine  (SYNTHROID) 50 MCG tablet Take 50 mcg by mouth daily before breakfast. 01/13/19   [provider]  Magnesium 400 MG TABS Take 400 mg by mouth at bedtime.    [provider]  naratriptan (AMERGE) 2.5 MG tablet Take 1 tablet (2.5 mg total) by mouth in the morning and at bedtime. Patient not taking: Reported on 10/20/2019 09/25/19   Anson Fret, MD  promethazine (PHENERGAN) 25 MG suppository Place 1 suppository (25 mg total) rectally every 6 (six) hours as needed for nausea or vomiting. 02/06/19   Lomax, Amy, NP  rizatriptan (MAXALT) 10 MG tablet Take 10 mg by mouth as needed for migraine. May repeat in 2 hours if needed    [provider]  sodium chloride (OCEAN) 0.65 % SOLN nasal spray Place 1 spray into both nostrils every 4 (four) hours as needed for congestion.    [provider]  SUMAtriptan (IMITREX) 100 MG tablet Take 1 tablet (100 mg total) by mouth once as needed for up to 1 dose for migraine. May repeat in 2 hours if headache persists or recurs. 12/03/19   Lomax, Amy, NP  topiramate (TOPAMAX) 50 MG tablet Take 3 tablets (150 mg total) by mouth at bedtime. 11/12/18   Lomax, Amy, NP    Allergies    Ivp dye [iodinated diagnostic agents]; Antihistamines, loratadine-type; Ciprofloxacin; Dymista [azelastine-fluticasone]; Lortab [hydrocodone-acetaminophen]; Macrobid [nitrofurantoin]; Montelukast; Nsaids; Nurtec [rimegepant sulfate]; Other; Pepcid [famotidine]; Prednisone; Prilosec [omeprazole]; Rosuvastatin calcium; Vantin [cefpodoxime]; and Zofran [ondansetron]  Review of Systems   Review of Systems  Constitutional: Negative for appetite change, chills and fever.  Respiratory: Negative for chest tightness and shortness of breath.   Cardiovascular: Negative for chest pain.  Gastrointestinal: Positive for abdominal pain (Left lower quadrant pain), diarrhea and nausea. Negative for abdominal distention, blood in stool and vomiting.  Genitourinary: Negative for  decreased urine volume, difficulty urinating, dysuria and flank pain.  Musculoskeletal: Negative for back pain.  Skin: Negative for color change and rash.  Neurological: Negative for dizziness, syncope, weakness, numbness and headaches.  Hematological: Negative for adenopathy.    Physical Exam Updated Vital Signs BP (!) 114/59   Pulse 80   Temp 98 F (36.7 C) (Oral)   Resp 20   Ht 5\' 4"  (1.626 m)   Wt 64 kg   SpO2 99%   BMI 24.20 kg/m   Physical Exam Vitals and nursing note reviewed.  Constitutional:      General: She is not in acute distress.    Appearance: Normal appearance. She is not ill-appearing or toxic-appearing.  HENT:     Mouth/Throat:     Mouth: Mucous membranes are moist.  Cardiovascular:     Rate and Rhythm: Normal rate and regular rhythm.     Pulses: Normal pulses.  Pulmonary:     Effort: Pulmonary effort  is normal.     Breath sounds: Normal breath sounds.  Abdominal:     Palpations: Abdomen is soft. There is no mass.     Tenderness: There is abdominal tenderness. There is no right CVA tenderness or left CVA tenderness.     Comments: Tenderness left lower quadrant.  No guarding or rebound.  Abdomen is soft.  Musculoskeletal:        General: Normal range of motion.     Cervical back: Normal range of motion.     Right lower leg: No edema.     Left lower leg: No edema.  Skin:    General: Skin is warm.     Capillary Refill: Capillary refill takes less than 2 seconds.     Findings: No rash.  Neurological:     General: No focal deficit present.     Mental Status: She is alert.     Sensory: No sensory deficit.     Motor: No weakness.     ED Results / Procedures / Treatments   Labs (all labs ordered are listed, but only abnormal results are displayed) Labs Reviewed  URINALYSIS, ROUTINE W REFLEX MICROSCOPIC - Abnormal; Notable for the following components:      Result Value   Color, Urine AMBER (*)    Leukocytes,Ua SMALL (*)    All other components  within normal limits  CBC WITH DIFFERENTIAL/PLATELET - Abnormal; Notable for the following components:   WBC 3.1 (*)    Platelets 112 (*)    All other components within normal limits  COMPREHENSIVE METABOLIC PANEL - Abnormal; Notable for the following components:   Chloride 112 (*)    CO2 19 (*)    Creatinine, Ser 1.16 (*)    GFR calc non Af Amer 50 (*)    GFR calc Af Amer 58 (*)    All other components within normal limits  GASTROINTESTINAL PANEL BY PCR, STOOL (REPLACES STOOL CULTURE)  C DIFFICILE QUICK SCREEN W PCR REFLEX  LIPASE, BLOOD    EKG None  Radiology CT ABDOMEN PELVIS WO CONTRAST  Result Date: 01/16/2020 CLINICAL DATA:  Left lower quadrant pain, states she is being treated for a UTI, since 01/12/2020 EXAM: CT ABDOMEN AND PELVIS WITHOUT CONTRAST TECHNIQUE: Multidetector CT imaging of the abdomen and pelvis was performed following the standard protocol without IV contrast. COMPARISON:  None. FINDINGS: Lower chest: No acute abnormality. Somewhat limited evaluation of the abdominal viscera given the lack of IV contrast. Hepatobiliary: No focal liver abnormality is seen. No gallstones, gallbladder wall thickening, or biliary dilatation. Pancreas: Unremarkable.  No surrounding inflammatory changes. Spleen: Normal in size without focal abnormality. Adrenals/Urinary Tract: Adrenal glands are unremarkable. Kidneys are symmetric in size. No renal calculi identified. No hydronephrosis. Urinary bladder is unremarkable. Stomach/Bowel: Stomach is within normal limits. There is a mildly dilated segment of small bowel in the low central abdomen with smooth tapering on either side, possibly focal ileus. The bowel is otherwise unremarkable. Vascular/Lymphatic: Aortic atherosclerosis. No enlarged abdominal or pelvic lymph nodes. Reproductive: Uterus and bilateral adnexa are unremarkable. Other: No abdominal wall hernia or abnormality. No abdominopelvic ascites. Musculoskeletal: No acute finding. There  is very mild vertebral height loss with a Schmorl's node at T12. IMPRESSION: 1. There is a mildly dilated segment of small bowel in the low central abdomen with smooth tapering on either side, possibly focal ileus. 2. No other acute finding on a noncontrast scan. 3. Aortic atherosclerosis. Aortic Atherosclerosis (ICD10-I70.0). Electronically Signed   By: Harriett Sine  Pete Glatter M.D.   On: 01/16/2020 15:01    Procedures Procedures (including critical care time)  Medications Ordered in ED Medications  ondansetron (ZOFRAN) injection 4 mg (4 mg Intravenous Not Given 01/16/20 1416)  sodium chloride 0.9 % bolus 1,000 mL (0 mLs Intravenous Stopped 01/16/20 1607)  morphine 4 MG/ML injection 4 mg (4 mg Intravenous Given 01/16/20 1417)  fentaNYL (SUBLIMAZE) injection 50 mcg (50 mcg Intravenous Given 01/16/20 1507)    ED Course  I have reviewed the triage vital signs and the nursing notes.  Pertinent labs & imaging results that were available during my care of the patient were reviewed by me and considered in my medical decision making (see chart for details).    MDM Rules/Calculators/A&P                         Patient with left lower quadrant pain and nausea for several days.  Diarrhea x2 weeks but has now improved.  She is nontoxic appearing but appears uncomfortable during exam.  Will obtain labs and CT of the abdomen and pelvis.  She has IV contrast allergy, so will proceed with Noncon CT.   Consulted Dr. Karilyn Cota and discussed findings. He recommends GI pathogen panel and C diff PCR testing.  He will see pt in his office next week if not requiring admission.    1805 patient called out now having severe left lower abdominal pain again.  Nausea without vomiting.  Remains afebrile. No diarrhea here.  Will consult general surgery.  Consulted Dr. Lovell Sheehan discussed findings.  He recommends hospitalist admission and he will consult in a.m.  Does not recommend NG tube at this time since patient is not currently  vomiting.  Consulted Dr. Adrian Blackwater, agrees to admit.    Final Clinical Impression(s) / ED Diagnoses Final diagnoses:  Left lower quadrant abdominal pain  Diarrhea, unspecified type  [facility])    Rx / DC Orders ED Discharge Orders    None       Pauline Aus, PA-C 01/16/20 1900    Bethann Berkshire, MD 01/18/20 1242

## 2020-01-16 NOTE — H&P (Signed)
History and Physical  Sabrina Duke IRW:[id number] DOB: 06-25-1955 DOA: 01/16/2020  Referring physician: Pauline Aus, PA-C, EDP PCP: Mardella Layman, MD  Outpatient Specialists:   Patient Coming From: home  Chief Complaint: abdominal pain  HPI: Sabrina Duke is a 64 y.o. female with a history of hypothyroidism, GERD, migraines.  Patient seen for left lower quadrant pain for the past 3 days.  In addition, she has been having diarrhea or loose stools for the past 3 to 4 weeks. Pain has been intermittent and radiates down into her thigh and into her left flank.  Pain is improved with laying down and is also improved by eating bland food, such as the brat diet.  Pain worse with eating regular food.  No improvement with passing stool or flatus.  She has been on penicillin and Bactrim for the past 3 to 4 days due to UTI.  Emergency Department Course: CT shows a small segment of dilated intestines concerning for ileus.  White count normal.  Review of Systems:   Pt denies any fevers, chills, constipation, shortness of breath, dyspnea on exertion, orthopnea, cough, wheezing, palpitations, headache, vision changes, lightheadedness, dizziness, melena, rectal bleeding.  Review of systems are otherwise negative  Past Medical History:  Diagnosis Date  . Allergic rhinitis   . Decreased bone density   . Depression   . GERD (gastroesophageal reflux disease)   . Hypothyroid   . Iron deficiency anemia   . Low vitamin D level   . Migraine    Past Surgical History:  Procedure Laterality Date  . BIOPSY  03/10/2019   Procedure: BIOPSY;  Surgeon: Malissa Hippo, MD;  Location: [facility];  Service: Endoscopy;;  gastric   . ESOPHAGOGASTRODUODENOSCOPY N/A 03/10/2019   Procedure: ESOPHAGOGASTRODUODENOSCOPY (EGD);  Surgeon: Malissa Hippo, MD;  Location: [facility];  Service: Endoscopy;  Laterality: N/A;  2:40-rescheduled 10/5 @11 :55am per office  . hemroidectomy  1990  . OVARIAN CYST  SURGERY  1972  . SHOULDER SURGERY Left 11/2017  . TUBAL LIGATION  1997  . uterine polyp removal  05/2015   Social History:  reports that she has quit smoking. She has never used smokeless tobacco. She reports that she does not drink alcohol and does not use drugs. Patient lives at home  Allergies  Allergen Reactions  . Ivp Dye [Iodinated Diagnostic Agents] Anaphylaxis  . Antihistamines, Loratadine-Type Other (See Comments)    Sleepy  . Ciprofloxacin Nausea And Vomiting  . Dymista [Azelastine-Fluticasone] Other (See Comments)    Heachache  . Lortab [Hydrocodone-Acetaminophen] Nausea And Vomiting  . Macrobid [Nitrofurantoin] Other (See Comments)    Unknown  . Montelukast Other (See Comments)    Sleep  . Nsaids     Pt can tolerate small amounts of Aleve "mess my stomach up, have had ulcer in the past"  . Nurtec [Rimegepant Sulfate] Nausea Only  . Other     Patient not sure about these allergies  Darvocet-   Mermantin- "makes me sick on my stomach"  Bio Citrate Calcium with Vitamin D- "hurt stomach"     . Pepcid [Famotidine] Other (See Comments)    Headache  . Prednisone     "makes me hyper and gives me a headache"  . Prilosec [Omeprazole] Other (See Comments)    Headache  . Rosuvastatin Calcium Other (See Comments)    Headaches.  06/2015 [Cefpodoxime] Nausea And Vomiting  . Zofran [Ondansetron] Other (See Comments)    Causes headaches per pt  Family History  Problem Relation Age of Onset  . Anxiety disorder Mother   . Depression Mother   . Bone cancer Mother   . OCD Mother   . Cerebral aneurysm Maternal Uncle   . Drug abuse Brother   . Depression Brother       Prior to Admission medications   Medication Sig Start Date End Date Taking? Authorizing Provider  baclofen (LIORESAL) 10 MG tablet Take 10 mg by mouth 2 (two) times daily. 01/14/20  Yes [provider]  penicillin v potassium (VEETID) 500 MG tablet Take 500 mg by mouth 2 (two) times daily.  01/14/20  Yes [provider]  sulfamethoxazole-trimethoprim (BACTRIM DS) 800-160 MG tablet Take 1 tablet by mouth 2 (two) times daily. 01/13/20  Yes [provider]  Vitamin D, Ergocalciferol, (DRISDOL) 1.25 MG (50000 UNIT) CAPS capsule Take 50,000 Units by mouth once a week. 12/24/19  Yes [provider]  buPROPion (WELLBUTRIN SR) 100 MG 12 hr tablet TAKE 1 TABLET BY MOUTHTWICE A DAY. 12/29/19   Corie Chiquitoarter, [name], PMHNP  Eszopiclone (ESZOPICLONE) 3 MG TABS Take 1 tablet (3 mg total) by mouth at bedtime as needed. Take immediately before bedtime 08/02/19   Corie Chiquitoarter, [name], PMHNP  FLUoxetine (PROZAC) 20 MG capsule Take 1 capsule (20 mg total) by mouth daily. 12/19/19   Corie Chiquitoarter, [name], PMHNP  Fremanezumab-vfrm (AJOVY) 225 MG/1.5ML SOAJ Inject 225 mg into the skin every 30 (thirty) days. 11/10/19   Butch PennyMillikan, Megan, NP  levothyroxine (SYNTHROID) 50 MCG tablet Take 50 mcg by mouth daily before breakfast. 01/13/19   [provider]  Magnesium 400 MG TABS Take 400 mg by mouth at bedtime.    [provider]  naratriptan (AMERGE) 2.5 MG tablet Take 1 tablet (2.5 mg total) by mouth in the morning and at bedtime. Patient not taking: Reported on 10/20/2019 09/25/19   Anson FretAhern, Antonia B, MD  promethazine (PHENERGAN) 25 MG suppository Place 1 suppository (25 mg total) rectally every 6 (six) hours as needed for nausea or vomiting. 02/06/19   Lomax, Amy, NP  rizatriptan (MAXALT) 10 MG tablet Take 10 mg by mouth as needed for migraine. May repeat in 2 hours if needed    [provider]  sodium chloride (OCEAN) 0.65 % SOLN nasal spray Place 1 spray into both nostrils every 4 (four) hours as needed for congestion.    [provider]  SUMAtriptan (IMITREX) 100 MG tablet Take 1 tablet (100 mg total) by mouth once as needed for up to 1 dose for migraine. May repeat in 2 hours if headache persists or recurs. 12/03/19   Lomax, Amy, NP  topiramate (TOPAMAX) 50 MG tablet Take 3  tablets (150 mg total) by mouth at bedtime. 11/12/18   Lomax, Amy, NP    Physical Exam: BP 113/77   Pulse 72   Temp 98 F (36.7 C) (Oral)   Resp 20   Ht 5\' 4"  (1.626 m)   Wt 64 kg   SpO2 97%   BMI 24.20 kg/m   . General: Elderly female. Awake and alert and oriented x3. No acute cardiopulmonary distress.  Marland Kitchen. HEENT: Normocephalic atraumatic.  Right and left ears normal in appearance.  Pupils equal, round, reactive to light. Extraocular muscles are intact. Sclerae anicteric and noninjected.  Moist mucosal membranes. No mucosal lesions.  . Neck: Neck supple without lymphadenopathy. No carotid bruits. No masses palpated.  . Cardiovascular: Regular rate with normal S1-S2 sounds. No murmurs, rubs, gallops auscultated. No JVD.  Marland Kitchen. Respiratory:  Good respiratory effort with no wheezes, rales, rhonchi. Lungs clear to auscultation bilaterally.  No accessory muscle use. . Abdomen: Soft, tender in the left lower quadrant without rebound or guarding. Nondistended. Active bowel sounds. No masses or hepatosplenomegaly  . Skin: No rashes, lesions, or ulcerations.  Dry, warm to touch. 2+ dorsalis pedis and radial pulses. . Musculoskeletal: No calf or leg pain. All major joints not erythematous nontender.  No upper or lower joint deformation.  Good ROM.  No contractures  . Psychiatric: Intact judgment and insight. Pleasant and cooperative. . Neurologic: No focal neurological deficits. Strength is 5/5 and symmetric in upper and lower extremities.  Cranial nerves II through XII are grossly intact.           Labs on Admission: I have personally reviewed following labs and imaging studies  CBC: Recent Labs  Lab 01/16/20 1418  WBC 3.1*  NEUTROABS 1.8  HGB 13.2  HCT 40.4  MCV 90.4  PLT 112*   Basic Metabolic Panel: Recent Labs  Lab 01/16/20 1418  NA 139  K 3.9  CL 112*  CO2 19*  GLUCOSE 97  BUN 20  CREATININE 1.16*  CALCIUM 8.9   GFR: Estimated Creatinine Clearance: 42.9 mL/min (A) (by C-G  formula based on SCr of 1.16 mg/dL (H)). Liver Function Tests: Recent Labs  Lab 01/16/20 1418  AST 19  ALT 16  ALKPHOS 69  BILITOT 0.4  PROT 6.6  ALBUMIN 4.1   Recent Labs  Lab 01/16/20 1418  LIPASE 25   No results for input(s): AMMONIA in the last 168 hours. Coagulation Profile: No results for input(s): INR, PROTIME in the last 168 hours. Cardiac Enzymes: No results for input(s): CKTOTAL, CKMB, CKMBINDEX, TROPONINI in the last 168 hours. BNP (last 3 results) No results for input(s): PROBNP in the last 8760 hours. HbA1C: No results for input(s): HGBA1C in the last 72 hours. CBG: No results for input(s): GLUCAP in the last 168 hours. Lipid Profile: No results for input(s): CHOL, HDL, LDLCALC, TRIG, CHOLHDL, LDLDIRECT in the last 72 hours. Thyroid Function Tests: No results for input(s): TSH, T4TOTAL, FREET4, T3FREE, THYROIDAB in the last 72 hours. Anemia Panel: No results for input(s): VITAMINB12, FOLATE, FERRITIN, TIBC, IRON, RETICCTPCT in the last 72 hours. Urine analysis:    Component Value Date/Time   COLORURINE AMBER (A) 01/16/2020 1153   APPEARANCEUR CLEAR 01/16/2020 1153   LABSPEC 1.018 01/16/2020 1153   PHURINE 6.0 01/16/2020 1153   GLUCOSEU NEGATIVE 01/16/2020 1153   HGBUR NEGATIVE 01/16/2020 1153   BILIRUBINUR NEGATIVE 01/16/2020 1153   KETONESUR NEGATIVE 01/16/2020 1153   PROTEINUR NEGATIVE 01/16/2020 1153   NITRITE NEGATIVE 01/16/2020 1153   LEUKOCYTESUR SMALL (A) 01/16/2020 1153   Sepsis Labs: @LABRCNTIP (procalcitonin:4,lacticidven:4) )No results found for this or any previous visit (from the past 240 hour(s)).   Radiological Exams on Admission: CT ABDOMEN PELVIS WO CONTRAST  Result Date: 01/16/2020 CLINICAL DATA:  Left lower quadrant pain, states she is being treated for a UTI, since 01/12/2020 EXAM: CT ABDOMEN AND PELVIS WITHOUT CONTRAST TECHNIQUE: Multidetector CT imaging of the abdomen and pelvis was performed following the standard protocol  without IV contrast. COMPARISON:  None. FINDINGS: Lower chest: No acute abnormality. Somewhat limited evaluation of the abdominal viscera given the lack of IV contrast. Hepatobiliary: No focal liver abnormality is seen. No gallstones, gallbladder wall thickening, or biliary dilatation. Pancreas: Unremarkable.  No surrounding inflammatory changes. Spleen: Normal in size without focal abnormality. Adrenals/Urinary Tract: Adrenal glands are unremarkable. Kidneys are symmetric  in size. No renal calculi identified. No hydronephrosis. Urinary bladder is unremarkable. Stomach/Bowel: Stomach is within normal limits. There is a mildly dilated segment of small bowel in the low central abdomen with smooth tapering on either side, possibly focal ileus. The bowel is otherwise unremarkable. Vascular/Lymphatic: Aortic atherosclerosis. No enlarged abdominal or pelvic lymph nodes. Reproductive: Uterus and bilateral adnexa are unremarkable. Other: No abdominal wall hernia or abnormality. No abdominopelvic ascites. Musculoskeletal: No acute finding. There is very mild vertebral height loss with a Schmorl's node at T12. IMPRESSION: 1. There is a mildly dilated segment of small bowel in the low central abdomen with smooth tapering on either side, possibly focal ileus. 2. No other acute finding on a noncontrast scan. 3. Aortic atherosclerosis. Aortic Atherosclerosis (ICD10-I70.0). Electronically Signed   By: Emmaline Kluver M.D.   On: 01/16/2020 15:01    Assessment/Plan: Active Problems:   Migraine, unspecified, intractable, with status migrainosus   Acid reflux   Ileus (HCC)   GERD (gastroesophageal reflux disease)   Hypothyroid    This patient was discussed with the ED physician, including pertinent vitals, physical exam findings, labs, and imaging.  We also discussed care given by the ED provider.  1. Ileus a. Observation b. Clear liquids c. IV fluids d. Surgery to consult in the morning 2. Diarrhea a. Recent  antibiotic use.  Stool studies in process, including C. difficile 3. GERD a. Continue PPI 4. Hypothyroidism a. Check TSH 5. Migraine  DVT prophylaxis: SCDs Consultants: General surgery Code Status: Full code Family Communication: None Disposition Plan: Patient should be able to return home following evaluation   Levie Heritage, DO

## 2020-01-16 NOTE — ED Triage Notes (Signed)
Left lower quadrant pain, states she is being treated for a UTI, states she was started on Septra and then switched to Penicillin 500 mg. States she went to her PCP today and referred here for treatment

## 2020-01-16 NOTE — ED Notes (Signed)
Patient transported to CT 

## 2020-01-17 ENCOUNTER — Encounter (HOSPITAL_COMMUNITY): Payer: Self-pay | Admitting: Family Medicine

## 2020-01-17 ENCOUNTER — Other Ambulatory Visit: Payer: Self-pay

## 2020-01-17 DIAGNOSIS — E039 Hypothyroidism, unspecified: Secondary | ICD-10-CM | POA: Diagnosis present

## 2020-01-17 DIAGNOSIS — A059 Bacterial foodborne intoxication, unspecified: Secondary | ICD-10-CM | POA: Diagnosis present

## 2020-01-17 DIAGNOSIS — Z886 Allergy status to analgesic agent status: Secondary | ICD-10-CM | POA: Diagnosis not present

## 2020-01-17 DIAGNOSIS — F329 Major depressive disorder, single episode, unspecified: Secondary | ICD-10-CM | POA: Diagnosis present

## 2020-01-17 DIAGNOSIS — K219 Gastro-esophageal reflux disease without esophagitis: Secondary | ICD-10-CM | POA: Diagnosis present

## 2020-01-17 DIAGNOSIS — Z91041 Radiographic dye allergy status: Secondary | ICD-10-CM | POA: Diagnosis not present

## 2020-01-17 DIAGNOSIS — K567 Ileus, unspecified: Secondary | ICD-10-CM | POA: Diagnosis present

## 2020-01-17 DIAGNOSIS — N390 Urinary tract infection, site not specified: Secondary | ICD-10-CM | POA: Diagnosis present

## 2020-01-17 DIAGNOSIS — Z87891 Personal history of nicotine dependence: Secondary | ICD-10-CM | POA: Diagnosis not present

## 2020-01-17 DIAGNOSIS — R1032 Left lower quadrant pain: Secondary | ICD-10-CM | POA: Diagnosis present

## 2020-01-17 DIAGNOSIS — E559 Vitamin D deficiency, unspecified: Secondary | ICD-10-CM | POA: Diagnosis present

## 2020-01-17 DIAGNOSIS — R197 Diarrhea, unspecified: Secondary | ICD-10-CM | POA: Diagnosis not present

## 2020-01-17 DIAGNOSIS — Z881 Allergy status to other antibiotic agents status: Secondary | ICD-10-CM | POA: Diagnosis not present

## 2020-01-17 DIAGNOSIS — Z888 Allergy status to other drugs, medicaments and biological substances status: Secondary | ICD-10-CM | POA: Diagnosis not present

## 2020-01-17 DIAGNOSIS — Z8719 Personal history of other diseases of the digestive system: Secondary | ICD-10-CM | POA: Diagnosis not present

## 2020-01-17 DIAGNOSIS — G43911 Migraine, unspecified, intractable, with status migrainosus: Secondary | ICD-10-CM | POA: Diagnosis present

## 2020-01-17 DIAGNOSIS — D72819 Decreased white blood cell count, unspecified: Secondary | ICD-10-CM | POA: Diagnosis present

## 2020-01-17 DIAGNOSIS — Z20822 Contact with and (suspected) exposure to covid-19: Secondary | ICD-10-CM | POA: Diagnosis present

## 2020-01-17 DIAGNOSIS — D696 Thrombocytopenia, unspecified: Secondary | ICD-10-CM | POA: Diagnosis present

## 2020-01-17 DIAGNOSIS — Z79890 Hormone replacement therapy (postmenopausal): Secondary | ICD-10-CM | POA: Diagnosis not present

## 2020-01-17 DIAGNOSIS — Z79899 Other long term (current) drug therapy: Secondary | ICD-10-CM | POA: Diagnosis not present

## 2020-01-17 LAB — CBC
HCT: 35.7 % — ABNORMAL LOW (ref 36.0–46.0)
Hemoglobin: 11.6 g/dL — ABNORMAL LOW (ref 12.0–15.0)
MCH: 30.1 pg (ref 26.0–34.0)
MCHC: 32.5 g/dL (ref 30.0–36.0)
MCV: 92.5 fL (ref 80.0–100.0)
Platelets: 96 10*3/uL — ABNORMAL LOW (ref 150–400)
RBC: 3.86 MIL/uL — ABNORMAL LOW (ref 3.87–5.11)
RDW: 12.6 % (ref 11.5–15.5)
WBC: 2.8 10*3/uL — ABNORMAL LOW (ref 4.0–10.5)
nRBC: 0 % (ref 0.0–0.2)

## 2020-01-17 LAB — BASIC METABOLIC PANEL
Anion gap: 5 (ref 5–15)
BUN: 14 mg/dL (ref 8–23)
CO2: 19 mmol/L — ABNORMAL LOW (ref 22–32)
Calcium: 8.1 mg/dL — ABNORMAL LOW (ref 8.9–10.3)
Chloride: 118 mmol/L — ABNORMAL HIGH (ref 98–111)
Creatinine, Ser: 1.14 mg/dL — ABNORMAL HIGH (ref 0.44–1.00)
GFR calc Af Amer: 59 mL/min — ABNORMAL LOW (ref >60)
GFR calc non Af Amer: 51 mL/min — ABNORMAL LOW (ref >60)
Glucose, Bld: 85 mg/dL (ref 70–99)
Potassium: 4.3 mmol/L (ref 3.5–5.1)
Sodium: 142 mmol/L (ref 135–145)

## 2020-01-17 LAB — TSH: TSH: 1.911 u[IU]/mL (ref 0.350–4.500)

## 2020-01-17 MED ORDER — SODIUM CHLORIDE 0.9 % IV BOLUS
500.0000 mL | Freq: Once | INTRAVENOUS | Status: AC
  Administered 2020-01-17: 500 mL via INTRAVENOUS

## 2020-01-17 MED ORDER — KETOROLAC TROMETHAMINE 30 MG/ML IJ SOLN
15.0000 mg | Freq: Four times a day (QID) | INTRAMUSCULAR | Status: DC | PRN
  Administered 2020-01-17 – 2020-01-18 (×4): 15 mg via INTRAVENOUS
  Filled 2020-01-17 (×4): qty 1

## 2020-01-17 MED ORDER — PHENAZOPYRIDINE HCL 100 MG PO TABS
100.0000 mg | ORAL_TABLET | Freq: Three times a day (TID) | ORAL | Status: DC
  Administered 2020-01-17 – 2020-01-20 (×9): 100 mg via ORAL
  Filled 2020-01-17 (×9): qty 1

## 2020-01-17 MED ORDER — BUTALBITAL-APAP-CAFFEINE 50-325-40 MG PO TABS
1.0000 | ORAL_TABLET | Freq: Four times a day (QID) | ORAL | Status: DC | PRN
  Administered 2020-01-17: 1 via ORAL
  Filled 2020-01-17: qty 1

## 2020-01-17 MED ORDER — MORPHINE SULFATE (PF) 2 MG/ML IV SOLN: 0.5000 mg | INTRAVENOUS | Status: DC | PRN

## 2020-01-17 MED ORDER — PROMETHAZINE HCL 25 MG/ML IJ SOLN
12.5000 mg | Freq: Four times a day (QID) | INTRAMUSCULAR | Status: DC | PRN
  Administered 2020-01-17 – 2020-01-19 (×3): 12.5 mg via INTRAVENOUS
  Filled 2020-01-17 (×3): qty 1

## 2020-01-17 MED ORDER — SODIUM CHLORIDE 0.9 % IV SOLN: Freq: Once | INTRAVENOUS | Status: AC

## 2020-01-17 MED ORDER — LEVOTHYROXINE SODIUM 50 MCG PO TABS: 50.0000 ug | ORAL_TABLET | Freq: Every day | ORAL | Status: DC

## 2020-01-17 MED ORDER — SUMATRIPTAN SUCCINATE 50 MG PO TABS: 100.0000 mg | ORAL_TABLET | Freq: Once | ORAL | Status: DC | PRN

## 2020-01-17 NOTE — Consult Note (Signed)
Reason for Consult: Ileus, history of diarrhea Referring Physician: Dr. Jenean LindauShah  Sabrina Duke is an 64 y.o. female.  HPI: Patient is a 64 year old white female who presented to [facility] with a 3-day history of worsening left lower quadrant abdominal pain.  She also describes having diarrhea for the last 3 to 4 weeks.  She states her pain was made worse with eating.  The pain was crampy in nature.  She denies any fever or chills.  She was placed on antibiotics for the UTI.  A CT scan of the abdomen was done yesterday which revealed an ileus-like pattern.  She has never had abdominal surgery.  She has been in the hospital for further evaluation and treatment.  Since her admission, her abdominal cramping has improved.  She is passing flatus.  She has had no bowel movements.  She has had no vomiting.  She is hungry.  She is tolerating clear liquid diet.  Past Medical History:  Diagnosis Date  . Allergic rhinitis   . Decreased bone density   . Depression   . GERD (gastroesophageal reflux disease)   . Hypothyroid   . Iron deficiency anemia   . Low vitamin D level   . Migraine     Past Surgical History:  Procedure Laterality Date  . BIOPSY  03/10/2019   Procedure: BIOPSY;  Surgeon: Malissa Hippoehman, Najeeb U, MD;  Location: [facility];  Service: Endoscopy;;  gastric   . ESOPHAGOGASTRODUODENOSCOPY N/A 03/10/2019   Procedure: ESOPHAGOGASTRODUODENOSCOPY (EGD);  Surgeon: Malissa Hippoehman, Najeeb U, MD;  Location: [facility];  Service: Endoscopy;  Laterality: N/A;  2:40-rescheduled 10/5 @11 :55am per office  . hemroidectomy  1990  . OVARIAN CYST SURGERY  1972  . SHOULDER SURGERY Left 11/2017  . TUBAL LIGATION  1997  . uterine polyp removal  05/2015    Family History  Problem Relation Age of Onset  . Anxiety disorder Mother   . Depression Mother   . Bone cancer Mother   . OCD Mother   . Cerebral aneurysm Maternal Uncle   . Drug abuse Brother   . Depression Brother     Social History:  reports  that she has quit smoking. She has never used smokeless tobacco. She reports that she does not drink alcohol and does not use drugs.  Allergies:  Allergies  Allergen Reactions  . Ivp Dye [Iodinated Diagnostic Agents] Anaphylaxis  . Antihistamines, Loratadine-Type Other (See Comments)    Sleepy  . Ciprofloxacin Nausea And Vomiting  . Dymista [Azelastine-Fluticasone] Other (See Comments)    Heachache  . Lortab [Hydrocodone-Acetaminophen] Nausea And Vomiting  . Macrobid [Nitrofurantoin] Other (See Comments)    Unknown  . Montelukast Other (See Comments)    Sleep  . Nsaids     Pt can tolerate small amounts of Aleve "mess my stomach up, have had ulcer in the past"  . Nurtec [Rimegepant Sulfate] Nausea Only  . Other     Patient not sure about these allergies  Darvocet-   Mermantin- "makes me sick on my stomach"  Bio Citrate Calcium with Vitamin D- "hurt stomach"     . Pepcid [Famotidine] Other (See Comments)    Headache  . Prednisone     "makes me hyper and gives me a headache"  . Prilosec [Omeprazole] Other (See Comments)    Headache  . Rosuvastatin Calcium Other (See Comments)    Headaches.  Varney Baas. Vantin [Cefpodoxime] Nausea And Vomiting  . Zofran [Ondansetron] Other (See Comments)    Causes  headaches per pt    Medications: I have reviewed the patient's current medications.  Results for orders placed or performed during the hospital encounter of 01/16/20 (from the past 48 hour(s))  Urinalysis, Routine w reflex microscopic Urine, Clean Catch     Status: Abnormal   Collection Time: 01/16/20 11:53 AM  Result Value Ref Range   Color, Urine AMBER (A) YELLOW    Comment: BIOCHEMICALS MAY BE AFFECTED BY COLOR   APPearance CLEAR CLEAR   Specific Gravity, Urine 1.018 1.005 - 1.030   pH 6.0 5.0 - 8.0   Glucose, UA NEGATIVE NEGATIVE mg/dL   Hgb urine dipstick NEGATIVE NEGATIVE   Bilirubin Urine NEGATIVE NEGATIVE   Ketones, ur NEGATIVE NEGATIVE mg/dL   Protein, ur NEGATIVE  NEGATIVE mg/dL   Nitrite NEGATIVE NEGATIVE   Leukocytes,Ua SMALL (A) NEGATIVE   RBC / HPF 0-5 0 - 5 RBC/hpf   WBC, UA 11-20 0 - 5 WBC/hpf   Bacteria, UA NONE SEEN NONE SEEN   Squamous Epithelial / LPF 0-5 0 - 5   Mucus PRESENT    Budding Yeast PRESENT    Amorphous Crystal PRESENT     Comment: Performed at [facility], [address] [place], [place] [address]  CBC with Differential     Status: Abnormal   Collection Time: 01/16/20  2:18 PM  Result Value Ref Range   WBC 3.1 (L) 4.0 - 10.5 K/uL   RBC 4.47 3.87 - 5.11 MIL/uL   Hemoglobin 13.2 12.0 - 15.0 g/dL   HCT 62.3 36 - 46 %   MCV 90.4 80.0 - 100.0 fL   MCH 29.5 26.0 - 34.0 pg   MCHC 32.7 30.0 - 36.0 g/dL   RDW 76.2 83.1 - 51.7 %   Platelets 112 (L) 150 - 400 K/uL    Comment: REPEATED TO VERIFY PLATELET COUNT CONFIRMED BY SMEAR SPECIMEN CHECKED FOR CLOTS Immature Platelet Fraction may be clinically indicated, consider ordering this additional test [id number]    nRBC 0.0 0.0 - 0.2 %   Neutrophils Relative % 57 %   Neutro Abs 1.8 1.7 - 7.7 K/uL   Lymphocytes Relative 31 %   Lymphs Abs 1.0 0.7 - 4.0 K/uL   Monocytes Relative 9 %   Monocytes Absolute 0.3 0 - 1 K/uL   Eosinophils Relative 2 %   Eosinophils Absolute 0.1 0 - 0 K/uL   Basophils Relative 1 %   Basophils Absolute 0.0 0 - 0 K/uL   Immature Granulocytes 0 %   Abs Immature Granulocytes 0.01 0.00 - 0.07 K/uL    Comment: Performed at [facility], [address], [place], [place] [address]  Comprehensive metabolic panel     Status: Abnormal   Collection Time: 01/16/20  2:18 PM  Result Value Ref Range   Sodium 139 135 - 145 mmol/L   Potassium 3.9 3.5 - 5.1 mmol/L   Chloride 112 (H) 98 - 111 mmol/L   CO2 19 (L) 22 - 32 mmol/L   Glucose, Bld 97 70 - 99 mg/dL    Comment: Glucose reference range applies only to samples taken after fasting for at least 8 hours.   BUN 20 8 - 23 mg/dL   Creatinine, Ser 4.85 (H) 0.44 - 1.00 mg/dL   Calcium 8.9 8.9 - 46.2 mg/dL   Total  Protein 6.6 6.5 - 8.1 g/dL   Albumin 4.1 3.5 - 5.0 g/dL   AST 19 15 - 41 U/L   ALT 16 0 - 44 U/L  Alkaline Phosphatase 69 38 - 126 U/L   Total Bilirubin 0.4 0.3 - 1.2 mg/dL   GFR calc non Af Amer 50 (L) >60 mL/min   GFR calc Af Amer 58 (L) >60 mL/min   Anion gap 8 5 - 15    Comment: Performed at [facility], [address] [place], [place] [address]  Lipase, blood     Status: None   Collection Time: 01/16/20  2:18 PM  Result Value Ref Range   Lipase 25 11 - 51 U/L    Comment: Performed at [facility], [address], [place], [place] [address]  SARS Coronavirus 2 by RT PCR (hospital order, performed in [facility] hospital lab) Nasopharyngeal Nasopharyngeal Swab     Status: None   Collection Time: 01/16/20  6:55 PM   Specimen: Nasopharyngeal Swab  Result Value Ref Range   SARS Coronavirus 2 NEGATIVE NEGATIVE    Comment: (NOTE) SARS-CoV-2 target nucleic acids are NOT DETECTED.  The SARS-CoV-2 RNA is generally detectable in upper and lower respiratory specimens during the acute phase of infection. The lowest concentration of SARS-CoV-2 viral copies this assay can detect is 250 copies / mL. A negative result does not preclude SARS-CoV-2 infection and should not be used as the sole basis for treatment or other patient management decisions.  A negative result may occur with improper specimen collection / handling, submission of specimen other than nasopharyngeal swab, presence of viral mutation(s) within the areas targeted by this assay, and inadequate number of viral copies (<250 copies / mL). A negative result must be combined with clinical observations, patient history, and epidemiological information.  Fact Sheet for Patients:   BoilerBrush.com.cy  Fact Sheet for Healthcare Providers: https://pope.com/  This test is not yet approved or  cleared by the [place] FDA and has been authorized for detection and/or diagnosis of  SARS-CoV-2 by FDA under an Emergency Use Authorization (EUA).  This EUA will remain in effect (meaning this test can be used) for the duration of the COVID-19 declaration under Section 564(b)(1) of the Act, 21 U.S.C. section 360bbb-3(b)(1), unless the authorization is terminated or revoked sooner.  Performed at [facility], [address], [place], [place] [address]   TSH     Status: None   Collection Time: 01/17/20  6:24 AM  Result Value Ref Range   TSH 1.911 0.350 - 4.500 uIU/mL    Comment: Performed by a 3rd Generation assay with a functional sensitivity of <=0.01 uIU/mL. Performed at [facility], [address], [place], [place] [address]   Basic metabolic panel     Status: Abnormal   Collection Time: 01/17/20  6:24 AM  Result Value Ref Range   Sodium 142 135 - 145 mmol/L   Potassium 4.3 3.5 - 5.1 mmol/L   Chloride 118 (H) 98 - 111 mmol/L   CO2 19 (L) 22 - 32 mmol/L   Glucose, Bld 85 70 - 99 mg/dL    Comment: Glucose reference range applies only to samples taken after fasting for at least 8 hours.   BUN 14 8 - 23 mg/dL   Creatinine, Ser 7.09 (H) 0.44 - 1.00 mg/dL   Calcium 8.1 (L) 8.9 - 10.3 mg/dL   GFR calc non Af Amer 51 (L) >60 mL/min   GFR calc Af Amer 59 (L) >60 mL/min   Anion gap 5 5 - 15    Comment: Performed at [facility], [address] [place], [place] [address]  CBC     Status:  Abnormal   Collection Time: 01/17/20  6:24 AM  Result Value Ref Range   WBC 2.8 (L) 4.0 - 10.5 K/uL   RBC 3.86 (L) 3.87 - 5.11 MIL/uL   Hemoglobin 11.6 (L) 12.0 - 15.0 g/dL   HCT 63.3 (L) 36 - 46 %   MCV 92.5 80.0 - 100.0 fL   MCH 30.1 26.0 - 34.0 pg   MCHC 32.5 30.0 - 36.0 g/dL   RDW 35.4 56.2 - 56.3 %   Platelets 96 (L) 150 - 400 K/uL    Comment: REPEATED TO VERIFY SPECIMEN CHECKED FOR CLOTS CONSISTENT WITH PREVIOUS RESULT    nRBC 0.0 0.0 - 0.2 %    Comment: Performed at [facility], [address], [place], [place] [address]    CT ABDOMEN PELVIS WO CONTRAST  Result  Date: 01/16/2020 CLINICAL DATA:  Left lower quadrant pain, states she is being treated for a UTI, since 01/12/2020 EXAM: CT ABDOMEN AND PELVIS WITHOUT CONTRAST TECHNIQUE: Multidetector CT imaging of the abdomen and pelvis was performed following the standard protocol without IV contrast. COMPARISON:  None. FINDINGS: Lower chest: No acute abnormality. Somewhat limited evaluation of the abdominal viscera given the lack of IV contrast. Hepatobiliary: No focal liver abnormality is seen. No gallstones, gallbladder wall thickening, or biliary dilatation. Pancreas: Unremarkable.  No surrounding inflammatory changes. Spleen: Normal in size without focal abnormality. Adrenals/Urinary Tract: Adrenal glands are unremarkable. Kidneys are symmetric in size. No renal calculi identified. No hydronephrosis. Urinary bladder is unremarkable. Stomach/Bowel: Stomach is within normal limits. There is a mildly dilated segment of small bowel in the low central abdomen with smooth tapering on either side, possibly focal ileus. The bowel is otherwise unremarkable. Vascular/Lymphatic: Aortic atherosclerosis. No enlarged abdominal or pelvic lymph nodes. Reproductive: Uterus and bilateral adnexa are unremarkable. Other: No abdominal wall hernia or abnormality. No abdominopelvic ascites. Musculoskeletal: No acute finding. There is very mild vertebral height loss with a Schmorl's node at T12. IMPRESSION: 1. There is a mildly dilated segment of small bowel in the low central abdomen with smooth tapering on either side, possibly focal ileus. 2. No other acute finding on a noncontrast scan. 3. Aortic atherosclerosis. Aortic Atherosclerosis (ICD10-I70.0). Electronically Signed   By: Emmaline Kluver M.D.   On: 01/16/2020 15:01    ROS:  Pertinent items are noted in HPI.  Blood pressure (!) 101/56, pulse 63, temperature 98 F (36.7 C), temperature source Oral, resp. rate 16, height 5\' 4"  (1.626 m), weight 67.9 kg, SpO2 99 %. Physical Exam:  Pleasant white female no acute distress Head is normocephalic, atraumatic Lungs clear auscultation with equal breath sounds bilaterally Heart examination reveals a regular rate and rhythm without S3, S4, murmurs Abdomen is soft, nontender, nondistended.  Occasional bowel sounds appreciated.  CT scan images personally reviewed  Assessment/Plan: Impression: Ileus of unknown etiology, seems to clinically be resolving.  No need for acute surgical intervention at this time. Plan: We will advance to full liquid diet.  May advance diet as tolerated.  01/17/2020, 9:18 AM

## 2020-01-17 NOTE — Progress Notes (Signed)
PROGRESS NOTE    Sabrina Duke  GHW:[id number] DOB: 09-Nov-1955 DOA: 01/16/2020 PCP: Mardella Layman, MD   Brief Narrative:  Per HPI: Sabrina Duke is a 64 y.o. female with a history of hypothyroidism, GERD, migraines.  Patient seen for left lower quadrant pain for the past 3 days.  In addition, she has been having diarrhea or loose stools for the past 3 to 4 weeks. Pain has been intermittent and radiates down into her thigh and into her left flank.  Pain is improved with laying down and is also improved by eating bland food, such as the brat diet.  Pain worse with eating regular food.  No improvement with passing stool or flatus.  She has been on penicillin and Bactrim for the past 3 to 4 days due to UTI.  -Patient was admitted with some signs of ileus on imaging.  There is also concern for diarrhea in the setting of recent antibiotic use.  8/14: Ileus appears to be clinically resolving and patient will be advanced to full liquid diet.  Seen by general surgery with no plans for intervention at this time.  Assessment & Plan:   Active Problems:   Migraine, unspecified, intractable, with status migrainosus   Acid reflux   Ileus (HCC)   GERD (gastroesophageal reflux disease)   Hypothyroid   Ileus of unknown etiology -Clinically resolving -Advance to full liquid per general surgery -Continue monitoring  Diarrhea -Appears resolved with no bowel movement since admission -Discontinue C. difficile testing  GERD -Allergic to famotidine and PPI -Continue monitor  Hypothyroidism -Resume levothyroxine -TSH 1.911  Headache with history of migraines -Resume sumatriptan -Fioricet as needed  DVT prophylaxis: SCDs Code Status: Full code Family Communication: None at bedside Disposition Plan:   Status is: Observation  The patient will require care spanning > 2 midnights and should be moved to inpatient because: IV treatments appropriate due to intensity of illness or inability  to take PO and Inpatient level of care appropriate due to severity of illness  Dispo: The patient is from: Home              Anticipated d/c is to: Home              Anticipated d/c date is: 1 day              Patient currently is not medically stable to d/c.   Consultants:   General surgery  Procedures:   See below  Antimicrobials:  Anti-infectives (From admission, onward)   None       Subjective: Patient seen and evaluated today with noted headache.  She denies any further diarrhea and states she still has some left lower quadrant abdominal pain.  She is still nauseous, but has not had any vomiting.  She is willing to advance her diet and try.  Objective: Vitals:   01/17/20 0320 01/17/20 0339 01/17/20 0604 01/17/20 0654  BP: (!) 87/55 (!) 84/60 100/66 (!) 101/56  Pulse: 64  68 63  Resp: 16   16  Temp: 97.9 F (36.6 C)   98 F (36.7 C)  TempSrc: Oral   Oral  SpO2: 100%   99%  Weight:      Height:        Intake/Output Summary (Last 24 hours) at 01/17/2020 1032 Last data filed at 01/17/2020 0500 Gross per 24 hour  Intake 2321.88 ml  Output --  Net 2321.88 ml   Filed Weights   01/16/20 1133 01/16/20 2330  Weight: 64 kg 67.9 kg    Examination:  General exam: Appears calm and comfortable  Respiratory system: Clear to auscultation. Respiratory effort normal. Cardiovascular system: S1 & S2 heard, RRR.  Gastrointestinal system: Abdomen is nondistended, tender over left lower quadrant Central nervous system: Alert and awake Extremities: Symmetric 5 x 5 power. Skin: No rashes, lesions or ulcers Psychiatry: Judgement and insight appear normal. Mood & affect appropriate.     Data Reviewed: I have personally reviewed following labs and imaging studies  CBC: Recent Labs  Lab 01/16/20 1418 01/17/20 0624  WBC 3.1* 2.8*  NEUTROABS 1.8  --   HGB 13.2 11.6*  HCT 40.4 35.7*  MCV 90.4 92.5  PLT 112* 96*   Basic Metabolic Panel: Recent Labs  Lab  01/16/20 1418 01/17/20 0624  NA 139 142  K 3.9 4.3  CL 112* 118*  CO2 19* 19*  GLUCOSE 97 85  BUN 20 14  CREATININE 1.16* 1.14*  CALCIUM 8.9 8.1*   GFR: Estimated Creatinine Clearance: 47.8 mL/min (A) (by C-G formula based on SCr of 1.14 mg/dL (H)). Liver Function Tests: Recent Labs  Lab 01/16/20 1418  AST 19  ALT 16  ALKPHOS 69  BILITOT 0.4  PROT 6.6  ALBUMIN 4.1   Recent Labs  Lab 01/16/20 1418  LIPASE 25   No results for input(s): AMMONIA in the last 168 hours. Coagulation Profile: No results for input(s): INR, PROTIME in the last 168 hours. Cardiac Enzymes: No results for input(s): CKTOTAL, CKMB, CKMBINDEX, TROPONINI in the last 168 hours. BNP (last 3 results) No results for input(s): PROBNP in the last 8760 hours. HbA1C: No results for input(s): HGBA1C in the last 72 hours. CBG: No results for input(s): GLUCAP in the last 168 hours. Lipid Profile: No results for input(s): CHOL, HDL, LDLCALC, TRIG, CHOLHDL, LDLDIRECT in the last 72 hours. Thyroid Function Tests: Recent Labs    01/17/20 0624  TSH 1.911   Anemia Panel: No results for input(s): VITAMINB12, FOLATE, FERRITIN, TIBC, IRON, RETICCTPCT in the last 72 hours. Sepsis Labs: No results for input(s): PROCALCITON, LATICACIDVEN in the last 168 hours.  Recent Results (from the past 240 hour(s))  SARS Coronavirus 2 by RT PCR (hospital order, performed in [facility] hospital lab) Nasopharyngeal Nasopharyngeal Swab     Status: None   Collection Time: 01/16/20  6:55 PM   Specimen: Nasopharyngeal Swab  Result Value Ref Range Status   SARS Coronavirus 2 NEGATIVE NEGATIVE Final    Comment: (NOTE) SARS-CoV-2 target nucleic acids are NOT DETECTED.  The SARS-CoV-2 RNA is generally detectable in upper and lower respiratory specimens during the acute phase of infection. The lowest concentration of SARS-CoV-2 viral copies this assay can detect is 250 copies / mL. A negative result does not preclude SARS-CoV-2  infection and should not be used as the sole basis for treatment or other patient management decisions.  A negative result may occur with improper specimen collection / handling, submission of specimen other than nasopharyngeal swab, presence of viral mutation(s) within the areas targeted by this assay, and inadequate number of viral copies (<250 copies / mL). A negative result must be combined with clinical observations, patient history, and epidemiological information.  Fact Sheet for Patients:   BoilerBrush.com.cy  Fact Sheet for Healthcare Providers: https://pope.com/  This test is not yet approved or  cleared by the [place] FDA and has been authorized for detection and/or diagnosis of SARS-CoV-2 by FDA under an Emergency Use Authorization (EUA).  This EUA will  remain in effect (meaning this test can be used) for the duration of the COVID-19 declaration under Section 564(b)(1) of the Act, 21 U.S.C. section 360bbb-3(b)(1), unless the authorization is terminated or revoked sooner.  Performed at [facility], [address] [place], [place] [address]          Radiology Studies: CT ABDOMEN PELVIS WO CONTRAST  Result Date: 01/16/2020 CLINICAL DATA:  Left lower quadrant pain, states she is being treated for a UTI, since 01/12/2020 EXAM: CT ABDOMEN AND PELVIS WITHOUT CONTRAST TECHNIQUE: Multidetector CT imaging of the abdomen and pelvis was performed following the standard protocol without IV contrast. COMPARISON:  None. FINDINGS: Lower chest: No acute abnormality. Somewhat limited evaluation of the abdominal viscera given the lack of IV contrast. Hepatobiliary: No focal liver abnormality is seen. No gallstones, gallbladder wall thickening, or biliary dilatation. Pancreas: Unremarkable.  No surrounding inflammatory changes. Spleen: Normal in size without focal abnormality. Adrenals/Urinary Tract: Adrenal glands are unremarkable.  Kidneys are symmetric in size. No renal calculi identified. No hydronephrosis. Urinary bladder is unremarkable. Stomach/Bowel: Stomach is within normal limits. There is a mildly dilated segment of small bowel in the low central abdomen with smooth tapering on either side, possibly focal ileus. The bowel is otherwise unremarkable. Vascular/Lymphatic: Aortic atherosclerosis. No enlarged abdominal or pelvic lymph nodes. Reproductive: Uterus and bilateral adnexa are unremarkable. Other: No abdominal wall hernia or abnormality. No abdominopelvic ascites. Musculoskeletal: No acute finding. There is very mild vertebral height loss with a Schmorl's node at T12. IMPRESSION: 1. There is a mildly dilated segment of small bowel in the low central abdomen with smooth tapering on either side, possibly focal ileus. 2. No other acute finding on a noncontrast scan. 3. Aortic atherosclerosis. Aortic Atherosclerosis (ICD10-I70.0). Electronically Signed   By: Emmaline Kluver M.D.   On: 01/16/2020 15:01        Scheduled Meds: . baclofen  10 mg Oral BID  . buPROPion  100 mg Oral BID  . FLUoxetine  20 mg Oral Daily  . levothyroxine  50 mcg Oral Q0600  . [START ON 01/18/2020] levothyroxine  50 mcg Oral QAC breakfast  .  morphine injection  2 mg Intravenous Once  . topiramate  150 mg Oral QHS   Continuous Infusions: . sodium chloride 125 mL/hr at 01/17/20 0405     LOS: 0 days    Time spent: 35 minutes    Casper Pagliuca Hoover Brunette, DO [facility]  If 7PM-7AM, please contact night-coverage [email or web address] 01/17/2020, 10:32 AM

## 2020-01-18 ENCOUNTER — Encounter (HOSPITAL_COMMUNITY): Payer: Self-pay | Admitting: Internal Medicine

## 2020-01-18 DIAGNOSIS — R197 Diarrhea, unspecified: Secondary | ICD-10-CM

## 2020-01-18 DIAGNOSIS — K567 Ileus, unspecified: Secondary | ICD-10-CM

## 2020-01-18 LAB — CBC
HCT: 35.7 % — ABNORMAL LOW (ref 36.0–46.0)
Hemoglobin: 11.6 g/dL — ABNORMAL LOW (ref 12.0–15.0)
MCH: 29.4 pg (ref 26.0–34.0)
MCHC: 32.5 g/dL (ref 30.0–36.0)
MCV: 90.4 fL (ref 80.0–100.0)
Platelets: 102 10*3/uL — ABNORMAL LOW (ref 150–400)
RBC: 3.95 MIL/uL (ref 3.87–5.11)
RDW: 12.3 % (ref 11.5–15.5)
WBC: 2.4 10*3/uL — ABNORMAL LOW (ref 4.0–10.5)
nRBC: 0 % (ref 0.0–0.2)

## 2020-01-18 LAB — BASIC METABOLIC PANEL
Anion gap: 7 (ref 5–15)
BUN: 8 mg/dL (ref 8–23)
CO2: 19 mmol/L — ABNORMAL LOW (ref 22–32)
Calcium: 8.5 mg/dL — ABNORMAL LOW (ref 8.9–10.3)
Chloride: 115 mmol/L — ABNORMAL HIGH (ref 98–111)
Creatinine, Ser: 1.04 mg/dL — ABNORMAL HIGH (ref 0.44–1.00)
GFR calc Af Amer: >60 mL/min (ref >60)
GFR calc non Af Amer: 57 mL/min — ABNORMAL LOW (ref >60)
Glucose, Bld: 84 mg/dL (ref 70–99)
Potassium: 3.6 mmol/L (ref 3.5–5.1)
Sodium: 141 mmol/L (ref 135–145)

## 2020-01-18 LAB — MAGNESIUM: Magnesium: 2.1 mg/dL (ref 1.7–2.4)

## 2020-01-18 NOTE — Progress Notes (Signed)
PROGRESS NOTE    Sabrina Duke  QIO:[id number] DOB: [birth year] DOA: 01/16/2020 PCP: Mardella Layman, MD   Brief Narrative:  Per HPI: Sabrina Diers Cookis a 64 y.o.femalewith a history of hypothyroidism, GERD, migraines. Patient seen for left lower quadrant pain for the past 3 days. In addition, she has been having diarrhea or loose stools for the past 3 to 4 weeks. Pain has been intermittent and radiates down into her thigh and into her left flank. Pain is improved with laying down andis also improved by eating bland food, such as the brat diet. Pain worse with eating regular food.No improvement with passing stool or flatus.  She has been on penicillin and Bactrim for the past 3 to 4 days due to UTI.  -Patient was admitted with some signs of ileus on imaging.  There is also concern for diarrhea in the setting of recent antibiotic use.  8/14: Ileus appears to be clinically resolving and patient will be advanced to full liquid diet.  Seen by general surgery with no plans for intervention at this time.  8/15: Patient's diet has been advanced and she continues to have abdominal pain after eating. She denies any further nausea or vomiting. Overall appears to be doing better. Appreciate GI evaluation with plans for further imaging today.  Assessment & Plan:   Active Problems:   Migraine, unspecified, intractable, with status migrainosus   Acid reflux   Ileus (HCC)   GERD (gastroesophageal reflux disease)   Hypothyroid   Ileus and left lower quadrant abdominal pain of unknown etiology -Clinically resolving, but still continues to have pain with eating -Advance to full diet today -Continue monitoring -Appreciate GI evaluation with plans for CT enterography which will be ordered  Diarrhea -Appears resolved with no bowel movement since admission -Discontinue C. difficile testing -Discontinue GI panel  GERD -Allergic to famotidine and PPI -Continue  monitor  Hypothyroidism -Resume levothyroxine -TSH 1.911  Headache with history of migraines-improved -Resume sumatriptan -Fioricet as needed  DVT prophylaxis: SCDs Code Status: Full code Family Communication: None at bedside Disposition Plan:  Status is: Inpatient  Remains inpatient appropriate because:Ongoing diagnostic testing needed not appropriate for outpatient work up and IV treatments appropriate due to intensity of illness or inability to take PO   Dispo: The patient is from: Home              Anticipated d/c is to: Home              Anticipated d/c date is: 1 day              Patient currently is not medically stable to d/c. Patient continues to have continued abdominal pain with eating that requires evaluation with imaging.  Consultants:   General surgery  GI  Procedures:   See below  Antimicrobials:  Anti-infectives (From admission, onward)   None       Subjective: Patient seen and evaluated today with overall improvement in symptoms noted. Diet has been advanced as requested, however patient continues to have abdominal pain with eating. She denies any nausea or vomiting.  Objective: Vitals:   01/17/20 2059 01/18/20 0040 01/18/20 0431 01/18/20 0800  BP: 105/72 111/74 120/67 (!) 95/51  Pulse: (!) 55 69 62 63  Resp: 16 16 18 18   Temp: 97.7 F (36.5 C) 97.7 F (36.5 C) 98.1 F (36.7 C) 98.6 F (37 C)  TempSrc: Axillary Oral Oral Oral  SpO2: 99% 99% 99% 100%  Weight:  Height:        Intake/Output Summary (Last 24 hours) at 01/18/2020 1352 Last data filed at 01/18/2020 0800 Gross per 24 hour  Intake 2584.9 ml  Output --  Net 2584.9 ml   Filed Weights   01/16/20 1133 01/16/20 2330  Weight: 64 kg 67.9 kg    Examination:  General exam: Appears calm and comfortable  Respiratory system: Clear to auscultation. Respiratory effort normal. Cardiovascular system: S1 & S2 heard, RRR Gastrointestinal system: Abdomen is soft Central  nervous system: Alert and oriented. No focal neurological deficits. Extremities: Symmetric 5 x 5 power. Skin: No rashes, lesions or ulcers Psychiatry: Judgement and insight appear normal. Mood & affect appropriate.     Data Reviewed: I have personally reviewed following labs and imaging studies  CBC: Recent Labs  Lab 01/16/20 1418 01/17/20 0624 01/18/20 0737  WBC 3.1* 2.8* 2.4*  NEUTROABS 1.8  --   --   HGB 13.2 11.6* 11.6*  HCT 40.4 35.7* 35.7*  MCV 90.4 92.5 90.4  PLT 112* 96* 102*   Basic Metabolic Panel: Recent Labs  Lab 01/16/20 1418 01/17/20 0624 01/18/20 0737  NA 139 142 141  K 3.9 4.3 3.6  CL 112* 118* 115*  CO2 19* 19* 19*  GLUCOSE 97 85 84  BUN 20 14 8   CREATININE 1.16* 1.14* 1.04*  CALCIUM 8.9 8.1* 8.5*  MG  --   --  2.1   GFR: Estimated Creatinine Clearance: 52.4 mL/min (A) (by C-G formula based on SCr of 1.04 mg/dL (H)). Liver Function Tests: Recent Labs  Lab 01/16/20 1418  AST 19  ALT 16  ALKPHOS 69  BILITOT 0.4  PROT 6.6  ALBUMIN 4.1   Recent Labs  Lab 01/16/20 1418  LIPASE 25   No results for input(s): AMMONIA in the last 168 hours. Coagulation Profile: No results for input(s): INR, PROTIME in the last 168 hours. Cardiac Enzymes: No results for input(s): CKTOTAL, CKMB, CKMBINDEX, TROPONINI in the last 168 hours. BNP (last 3 results) No results for input(s): PROBNP in the last 8760 hours. HbA1C: No results for input(s): HGBA1C in the last 72 hours. CBG: No results for input(s): GLUCAP in the last 168 hours. Lipid Profile: No results for input(s): CHOL, HDL, LDLCALC, TRIG, CHOLHDL, LDLDIRECT in the last 72 hours. Thyroid Function Tests: Recent Labs    01/17/20 0624  TSH 1.911   Anemia Panel: No results for input(s): VITAMINB12, FOLATE, FERRITIN, TIBC, IRON, RETICCTPCT in the last 72 hours. Sepsis Labs: No results for input(s): PROCALCITON, LATICACIDVEN in the last 168 hours.  Recent Results (from the past 240 hour(s))   SARS Coronavirus 2 by RT PCR (hospital order, performed in [facility] hospital lab) Nasopharyngeal Nasopharyngeal Swab     Status: None   Collection Time: 01/16/20  6:55 PM   Specimen: Nasopharyngeal Swab  Result Value Ref Range Status   SARS Coronavirus 2 NEGATIVE NEGATIVE Final    Comment: (NOTE) SARS-CoV-2 target nucleic acids are NOT DETECTED.  The SARS-CoV-2 RNA is generally detectable in upper and lower respiratory specimens during the acute phase of infection. The lowest concentration of SARS-CoV-2 viral copies this assay can detect is 250 copies / mL. A negative result does not preclude SARS-CoV-2 infection and should not be used as the sole basis for treatment or other patient management decisions.  A negative result may occur with improper specimen collection / handling, submission of specimen other than nasopharyngeal swab, presence of viral mutation(s) within the areas targeted by this assay, and  inadequate number of viral copies (<250 copies / mL). A negative result must be combined with clinical observations, patient history, and epidemiological information.  Fact Sheet for Patients:   BoilerBrush.com.cy  Fact Sheet for Healthcare Providers: https://pope.com/  This test is not yet approved or  cleared by the [place] FDA and has been authorized for detection and/or diagnosis of SARS-CoV-2 by FDA under an Emergency Use Authorization (EUA).  This EUA will remain in effect (meaning this test can be used) for the duration of the COVID-19 declaration under Section 564(b)(1) of the Act, 21 U.S.C. section 360bbb-3(b)(1), unless the authorization is terminated or revoked sooner.  Performed at [facility], [address], [place], [place] [address]          Radiology Studies: CT ABDOMEN PELVIS WO CONTRAST  Result Date: 01/16/2020 CLINICAL DATA:  Left lower quadrant pain, states she is being treated for a UTI,  since 01/12/2020 EXAM: CT ABDOMEN AND PELVIS WITHOUT CONTRAST TECHNIQUE: Multidetector CT imaging of the abdomen and pelvis was performed following the standard protocol without IV contrast. COMPARISON:  None. FINDINGS: Lower chest: No acute abnormality. Somewhat limited evaluation of the abdominal viscera given the lack of IV contrast. Hepatobiliary: No focal liver abnormality is seen. No gallstones, gallbladder wall thickening, or biliary dilatation. Pancreas: Unremarkable.  No surrounding inflammatory changes. Spleen: Normal in size without focal abnormality. Adrenals/Urinary Tract: Adrenal glands are unremarkable. Kidneys are symmetric in size. No renal calculi identified. No hydronephrosis. Urinary bladder is unremarkable. Stomach/Bowel: Stomach is within normal limits. There is a mildly dilated segment of small bowel in the low central abdomen with smooth tapering on either side, possibly focal ileus. The bowel is otherwise unremarkable. Vascular/Lymphatic: Aortic atherosclerosis. No enlarged abdominal or pelvic lymph nodes. Reproductive: Uterus and bilateral adnexa are unremarkable. Other: No abdominal wall hernia or abnormality. No abdominopelvic ascites. Musculoskeletal: No acute finding. There is very mild vertebral height loss with a Schmorl's node at T12. IMPRESSION: 1. There is a mildly dilated segment of small bowel in the low central abdomen with smooth tapering on either side, possibly focal ileus. 2. No other acute finding on a noncontrast scan. 3. Aortic atherosclerosis. Aortic Atherosclerosis (ICD10-I70.0). Electronically Signed   By: Emmaline Kluver M.D.   On: 01/16/2020 15:01        Scheduled Meds:  baclofen  10 mg Oral BID   buPROPion  100 mg Oral BID   FLUoxetine  20 mg Oral Daily   levothyroxine  50 mcg Oral Q0600   phenazopyridine  100 mg Oral TID WC   topiramate  150 mg Oral QHS     LOS: 1 day    Time spent: 35 minutes    Jamall Strohmeier Hoover Brunette, DO [facility]  If 7PM-7AM, please contact night-coverage [email or web address] 01/18/2020, 1:52 PM

## 2020-01-18 NOTE — Consult Note (Signed)
Referring Provider: No ref. provider found Primary Care Physician:  Mardella Layman, MD Primary Gastroenterologist:  Dr. Jena Gauss  Reason for Consultation: Ileus  HPI: 64 year old lady from [facility] admitted 2 days ago with abdominal pain and a 4-week history of nonbloody diarrhea.  Recently seen by her PCP which obtain stool studies which were reported to be negative.  Treated for UTI (strep per patient).  Initially started on Bactrim and transitioned to penicillin.  Took 2 days worth of penicillin.  Evaluation here demonstrated clean urine.  Further evaluation here demonstrated a mild focal ileus on a non-IV contrast abdominal CT.  (I personally reviewed images with Dr. Micheline Maze, the radiologist.  Area of abnormality noted on CT very focal and subtle 2.5 cm diameter small bowel at area of interest which is not dilated.  Simply has some fluid within it-nonspecific.  He states it could be considered normal); colon appeared normal.  The area noted on CT report is too far up to reach with a colonoscope.  No mass seen anywhere.  Patient states diarrhea stopped 5 days ago.  She has not passed any blood. Currently, gets a little nauseated with eating and pain does worsen to a modest degree.  No vomiting.  States she likes to "chew on bones" with her meals. No one else around her ill.  No similar symptoms in the past.  Mother had surgery for small bowel obstruction when she was young.  Grandmother had pancreatic cancer. Seen by Dr. Lovell Sheehan.  Not felt to have any surgical process.  Diet advanced to heart healthy. Patient presents with a leukopenia which has been a chronic finding.  She is followed by hematologist in [place].  Etiology apparently uncertain.  History of colonic polyps.  Last colonoscopy in [place] 2017; due for surveillance examination 2022.  Saw Dr. Karilyn Cota last year for reflux .  No H. pylori on histology.  Has reflux.  Gets headaches with all PPIs.  Gets by with taking occasional  Maalox if she is going to eat something spicy.  No dysphagia.  Past Medical History:  Diagnosis Date  . Allergic rhinitis   . Decreased bone density   . Depression   . GERD (gastroesophageal reflux disease)   . Hypothyroid   . Iron deficiency anemia   . Low vitamin D level   . Migraine     Past Surgical History:  Procedure Laterality Date  . BIOPSY  03/10/2019   Procedure: BIOPSY;  Surgeon: Malissa Hippo, MD;  Location: [facility];  Service: Endoscopy;;  gastric   . ESOPHAGOGASTRODUODENOSCOPY N/A 03/10/2019   Procedure: ESOPHAGOGASTRODUODENOSCOPY (EGD);  Surgeon: Malissa Hippo, MD;  Location: [facility];  Service: Endoscopy;  Laterality: N/A;  2:40-rescheduled 10/5 @11 :55am per office  . hemroidectomy  1990  . OVARIAN CYST SURGERY  1972  . SHOULDER SURGERY Left 11/2017  . TUBAL LIGATION  1997  . uterine polyp removal  05/2015    Prior to Admission medications   Medication Sig Start Date End Date Taking? Authorizing Provider  baclofen (LIORESAL) 10 MG tablet Take 10 mg by mouth 2 (two) times daily. 01/14/20  Yes [provider]  buPROPion (WELLBUTRIN SR) 100 MG 12 hr tablet TAKE 1 TABLET BY MOUTHTWICE A DAY. Patient taking differently: Take 100 mg by mouth 2 (two) times daily.  12/29/19  Yes 12/31/19, PMHNP  Eszopiclone (ESZOPICLONE) 3 MG TABS Take 1 tablet (3 mg total) by mouth at bedtime as needed. Take immediately before bedtime 08/02/19  Yes Corie Chiquito, PMHNP  FLUoxetine (PROZAC) 20 MG capsule Take 1 capsule (20 mg total) by mouth daily. 12/19/19  Yes Corie Chiquito, PMHNP  levothyroxine (SYNTHROID) 50 MCG tablet Take 50 mcg by mouth daily before breakfast. 01/13/19  Yes [provider]  Magnesium 400 MG TABS Take 400 mg by mouth at bedtime.   Yes [provider]  penicillin v potassium (VEETID) 500 MG tablet Take 500 mg by mouth 2 (two) times daily. 01/14/20  Yes [provider]  sodium chloride (OCEAN) 0.65 % SOLN nasal  spray Place 1 spray into both nostrils every 4 (four) hours as needed for congestion.   Yes [provider]  SUMAtriptan (IMITREX) 100 MG tablet Take 1 tablet (100 mg total) by mouth once as needed for up to 1 dose for migraine. May repeat in 2 hours if headache persists or recurs. 12/03/19  Yes Lomax, Amy, NP  topiramate (TOPAMAX) 50 MG tablet Take 3 tablets (150 mg total) by mouth at bedtime. 11/12/18  Yes Lomax, Amy, NP  Vitamin D, Ergocalciferol, (DRISDOL) 1.25 MG (50000 UNIT) CAPS capsule Take 50,000 Units by mouth once a week. 12/24/19  Yes [provider]  Fremanezumab-vfrm (AJOVY) 225 MG/1.5ML SOAJ Inject 225 mg into the skin every 30 (thirty) days. Patient not taking: Reported on 01/16/2020 11/10/19   Butch Penny, NP    Current Facility-Administered Medications  Medication Dose Route Frequency Provider Last Rate Last Admin  . baclofen (LIORESAL) tablet 10 mg  10 mg Oral BID Levie Heritage, DO   10 mg at 01/17/20 2202  . buPROPion (WELLBUTRIN SR) 12 hr tablet 100 mg  100 mg Oral BID Levie Heritage, DO   100 mg at 01/18/20 0830  . butalbital-acetaminophen-caffeine (FIORICET) 50-325-40 MG per tablet 1 tablet  1 tablet Oral Q6H PRN Sherryll Burger, Pratik D, DO   1 tablet at 01/17/20 1028  . FLUoxetine (PROZAC) capsule 20 mg  20 mg Oral Daily Levie Heritage, DO      . ketorolac (TORADOL) 30 MG/ML injection 15 mg  15 mg Intravenous Q6H PRN Sherryll Burger, Pratik D, DO   15 mg at 01/17/20 2211  . levothyroxine (SYNTHROID) tablet 50 mcg  50 mcg Oral Q0600 Levie Heritage, DO   50 mcg at 01/18/20 3419  . morphine 2 MG/ML injection 0.5 mg  0.5 mg Intravenous Q3H PRN Sherryll Burger, Pratik D, DO      . oxyCODONE (Oxy IR/ROXICODONE) immediate release tablet 5 mg  5 mg Oral Q4H PRN Levie Heritage, DO      . phenazopyridine (PYRIDIUM) tablet 100 mg  100 mg Oral TID WC Shah, Pratik D, DO   100 mg at 01/18/20 0829  . promethazine (PHENERGAN) injection 12.5 mg  12.5 mg Intravenous Q6H PRN Sherryll Burger, Pratik D, DO    12.5 mg at 01/17/20 1313  . SUMAtriptan (IMITREX) tablet 100 mg  100 mg Oral Once PRN Sherryll Burger, Pratik D, DO      . topiramate (TOPAMAX) tablet 150 mg  150 mg Oral QHS Levie Heritage, DO   150 mg at 01/17/20 2202  . zolpidem (AMBIEN) tablet 5 mg  5 mg Oral QHS PRN Levie Heritage, DO   5 mg at 01/17/20 2354    Allergies as of 01/16/2020 - Review Complete 01/16/2020  Allergen Reaction Noted  . Ivp dye [iodinated diagnostic agents] Anaphylaxis 04/23/2014  . Antihistamines, loratadine-type Other (See Comments) 03/05/2019  . Ciprofloxacin Nausea And Vomiting 04/23/2014  . Dymista [azelastine-fluticasone] Other (See  Comments) 04/09/2018  . Lortab [hydrocodone-acetaminophen] Nausea And Vomiting 06/27/2017  . Macrobid [nitrofurantoin] Other (See Comments) 04/23/2014  . Montelukast Other (See Comments) 03/05/2019  . Nsaids  06/27/2017  . Nurtec [rimegepant sulfate] Nausea Only 03/04/2019  . Other  06/27/2017  . Pepcid [famotidine] Other (See Comments) 01/21/2019  . Prednisone  06/27/2017  . Prilosec [omeprazole] Other (See Comments) 01/21/2019  . Rosuvastatin calcium Other (See Comments) 03/04/2019  . Vantin [cefpodoxime] Nausea And Vomiting 04/23/2014  . Zofran [ondansetron] Other (See Comments) 01/16/2020    Family History  Problem Relation Age of Onset  . Anxiety disorder Mother   . Depression Mother   . Bone cancer Mother   . OCD Mother   . Cerebral aneurysm Maternal Uncle   . Drug abuse Brother   . Depression Brother     Social History   Socioeconomic History  . Marital status: Married    Spouse name: Not on file  . Number of children: 1  . Years of education: Not on file  . Highest education level: High school graduate  Occupational History  . Occupation: disabled  Tobacco Use  . Smoking status: Former Games developermoker  . Smokeless tobacco: Never Used  Vaping Use  . Vaping Use: Never used  Substance and Sexual Activity  . Alcohol use: No  . Drug use: No  . Sexual activity:  Not on file  Other Topics Concern  . Not on file  Social History Narrative   Lives at home with husband   Right handed   No caffeine   Social Determinants of Health   Financial Resource Strain:   . Difficulty of Paying Living Expenses:   Food Insecurity:   . Worried About Programme researcher, broadcasting/film/videounning Out of Food in the Last Year:   . Baristaan Out of Food in the Last Year:   Transportation Needs:   . Freight forwarderLack of Transportation (Medical):   Marland Kitchen. Lack of Transportation (Non-Medical):   Physical Activity:   . Days of Exercise per Week:   . Minutes of Exercise per Session:   Stress:   . Feeling of Stress :   Social Connections:   . Frequency of Communication with Friends and Family:   . Frequency of Social Gatherings with Friends and Family:   . Attends Religious Services:   . Active Member of Clubs or Organizations:   . Attends BankerClub or Organization Meetings:   Marland Kitchen. Marital Status:   Intimate Partner Violence:   . Fear of Current or Ex-Partner:   . Emotionally Abused:   Marland Kitchen. Physically Abused:   . Sexually Abused:     Review of Systems:  As in history of present illness  Physical Exam: Vital signs in last 24 hours: Temp:  [97.7 F (36.5 C)-98.6 F (37 C)] 98.6 F (37 C) (08/15 0800) Pulse Rate:  [55-76] 63 (08/15 0800) Resp:  [15-18] 18 (08/15 0800) BP: (95-120)/(50-74) 95/51 (08/15 0800) SpO2:  [99 %-100 %] 100 % (08/15 0800) Last BM Date: 01/16/20 General:   Alert,   pleasant and cooperative in NAD Lungs:  Clear throughout to auscultation.   No wheezes, crackles, or rhonchi. No acute distress. Heart:  Regular rate and rhythm; no murmurs, clicks, rubs,  or gallops. Abdomen: Nondistended.  Positive bowel sounds.  She has localized mid left lower quadrant tenderness to palpation.  Guards with deep palpation.    Intake/Output from previous day: 08/14 0701 - 08/15 0700 In: 2584.9 [P.O.:480; I.V.:1604.9; IV Piggyback:500] Out: -  Intake/Output this shift: No intake/output data recorded.  Lab  Results: Recent Labs    01/16/20 1418 01/17/20 0624 01/18/20 0737  WBC 3.1* 2.8* 2.4*  HGB 13.2 11.6* 11.6*  HCT 40.4 35.7* 35.7*  PLT 112* 96* 102*   BMET Recent Labs    01/16/20 1418 01/17/20 0624 01/18/20 0737  NA 139 142 141  K 3.9 4.3 3.6  CL 112* 118* 115*  CO2 19* 19* 19*  GLUCOSE 97 85 84  BUN 20 14 8   CREATININE 1.16* 1.14* 1.04*  CALCIUM 8.9 8.1* 8.5*   LFT Recent Labs    01/16/20 1418  PROT 6.6  ALBUMIN 4.1  AST 19  ALT 16  ALKPHOS 69  BILITOT 0.4   PT/INR No results for input(s): LABPROT, INR in the last 72 hours. Hepatitis Panel No results for input(s): HEPBSAG, HCVAB, HEPAIGM, HEPBIGM in the last 72 hours. C-Diff No results for input(s): CDIFFTOX in the last 72 hours.  Studies/Results: CT ABDOMEN PELVIS WO CONTRAST  Result Date: 01/16/2020 CLINICAL DATA:  Left lower quadrant pain, states she is being treated for a UTI, since 01/12/2020 EXAM: CT ABDOMEN AND PELVIS WITHOUT CONTRAST TECHNIQUE: Multidetector CT imaging of the abdomen and pelvis was performed following the standard protocol without IV contrast. COMPARISON:  None. FINDINGS: Lower chest: No acute abnormality. Somewhat limited evaluation of the abdominal viscera given the lack of IV contrast. Hepatobiliary: No focal liver abnormality is seen. No gallstones, gallbladder wall thickening, or biliary dilatation. Pancreas: Unremarkable.  No surrounding inflammatory changes. Spleen: Normal in size without focal abnormality. Adrenals/Urinary Tract: Adrenal glands are unremarkable. Kidneys are symmetric in size. No renal calculi identified. No hydronephrosis. Urinary bladder is unremarkable. Stomach/Bowel: Stomach is within normal limits. There is a mildly dilated segment of small bowel in the low central abdomen with smooth tapering on either side, possibly focal ileus. The bowel is otherwise unremarkable. Vascular/Lymphatic: Aortic atherosclerosis. No enlarged abdominal or pelvic lymph nodes.  Reproductive: Uterus and bilateral adnexa are unremarkable. Other: No abdominal wall hernia or abnormality. No abdominopelvic ascites. Musculoskeletal: No acute finding. There is very mild vertebral height loss with a Schmorl's node at T12. IMPRESSION: 1. There is a mildly dilated segment of small bowel in the low central abdomen with smooth tapering on either side, possibly focal ileus. 2. No other acute finding on a noncontrast scan. 3. Aortic atherosclerosis. Aortic Atherosclerosis (ICD10-I70.0). Electronically Signed   By: 03/13/2020 M.D.   On: 01/16/2020 15:01     Impression: 64 year old lady admitted to the hospital with 3-week history of nonbloody diarrhea, left lower quadrant abdominal pain.  No blood per rectum.  Stool studies elsewhere have come back negative.  Recent UTI.  Subtle, if any, abnormality of the small bowel on noncontrast CT. Diarrhea has resolved.  No evidence of colitis on CT  Patient has persisting left lower quadrant abdominal pain -a little out of the ordinary for an infectious gastroenteritis from any cause -given overall paucity of significant CT findings.  Imaging of her GI tract somewhat limited given lack of contrast.  We need to keep in mind the possibility of an occult foreign body impaction in her small intestine.  Diarrhea seems to have resolved.  Her diet has been advanced  Recommendations: Continue observation.  Agree with advancing diet.  If persisting abdominal pain, would consider a oral contrast only CT enterography study.  Due for surveillance colonoscopy 2022.  Do not see a reason to move that up at this time.  Further recommendations to follow when she will be reassessed tomorrow.

## 2020-01-18 NOTE — Progress Notes (Signed)
  Subjective: Still with mild left-sided abdominal pain, though decreased.  Only seems to bother her when palpated.  No bowel movement.  Tolerating full liquid diet well.  Objective: Vital signs in last 24 hours: Temp:  [97.7 F (36.5 C)-98.6 F (37 C)] 98.6 F (37 C) (08/15 0800) Pulse Rate:  [55-76] 63 (08/15 0800) Resp:  [15-18] 18 (08/15 0800) BP: (95-120)/(50-74) 95/51 (08/15 0800) SpO2:  [99 %-100 %] 100 % (08/15 0800) Last BM Date: 01/16/20  Intake/Output from previous day: 08/14 0701 - 08/15 0700 In: 2584.9 [P.O.:480; I.V.:1604.9; IV Piggyback:500] Out: -  Intake/Output this shift: No intake/output data recorded.  General appearance: alert, cooperative and no distress GI: Soft with minimal tenderness to palpation of the left side of the abdomen.  No rigidity is noted.  Lab Results:  Recent Labs    01/17/20 0624 01/18/20 0737  WBC 2.8* 2.4*  HGB 11.6* 11.6*  HCT 35.7* 35.7*  PLT 96* 102*   BMET Recent Labs    01/16/20 1418 01/17/20 0624  NA 139 142  K 3.9 4.3  CL 112* 118*  CO2 19* 19*  GLUCOSE 97 85  BUN 20 14  CREATININE 1.16* 1.14*  CALCIUM 8.9 8.1*   PT/INR No results for input(s): LABPROT, INR in the last 72 hours.  Studies/Results: CT ABDOMEN PELVIS WO CONTRAST  Result Date: 01/16/2020 CLINICAL DATA:  Left lower quadrant pain, states she is being treated for a UTI, since 01/12/2020 EXAM: CT ABDOMEN AND PELVIS WITHOUT CONTRAST TECHNIQUE: Multidetector CT imaging of the abdomen and pelvis was performed following the standard protocol without IV contrast. COMPARISON:  None. FINDINGS: Lower chest: No acute abnormality. Somewhat limited evaluation of the abdominal viscera given the lack of IV contrast. Hepatobiliary: No focal liver abnormality is seen. No gallstones, gallbladder wall thickening, or biliary dilatation. Pancreas: Unremarkable.  No surrounding inflammatory changes. Spleen: Normal in size without focal abnormality. Adrenals/Urinary Tract:  Adrenal glands are unremarkable. Kidneys are symmetric in size. No renal calculi identified. No hydronephrosis. Urinary bladder is unremarkable. Stomach/Bowel: Stomach is within normal limits. There is a mildly dilated segment of small bowel in the low central abdomen with smooth tapering on either side, possibly focal ileus. The bowel is otherwise unremarkable. Vascular/Lymphatic: Aortic atherosclerosis. No enlarged abdominal or pelvic lymph nodes. Reproductive: Uterus and bilateral adnexa are unremarkable. Other: No abdominal wall hernia or abnormality. No abdominopelvic ascites. Musculoskeletal: No acute finding. There is very mild vertebral height loss with a Schmorl's node at T12. IMPRESSION: 1. There is a mildly dilated segment of small bowel in the low central abdomen with smooth tapering on either side, possibly focal ileus. 2. No other acute finding on a noncontrast scan. 3. Aortic atherosclerosis. Aortic Atherosclerosis (ICD10-I70.0). Electronically Signed   By: Emmaline Kluver M.D.   On: 01/16/2020 15:01    Anti-infectives: Anti-infectives (From admission, onward)   None      Assessment/Plan: Impression: Abdominal pain most likely secondary to nonspecific colitis.  Her diarrhea seems to have resolved.  She is still passing flatus.  Tolerating full liquid diet well.  Will advance to heart healthy diet.  No need for surgical intervention at this time.  LOS: 1 day    Franky Macho 01/18/2020

## 2020-01-19 ENCOUNTER — Inpatient Hospital Stay (HOSPITAL_COMMUNITY): Payer: BC Managed Care – PPO

## 2020-01-19 LAB — CBC
HCT: 36.4 % (ref 36.0–46.0)
Hemoglobin: 12.1 g/dL (ref 12.0–15.0)
MCH: 29.7 pg (ref 26.0–34.0)
MCHC: 33.2 g/dL (ref 30.0–36.0)
MCV: 89.4 fL (ref 80.0–100.0)
Platelets: 106 10*3/uL — ABNORMAL LOW (ref 150–400)
RBC: 4.07 MIL/uL (ref 3.87–5.11)
RDW: 12.2 % (ref 11.5–15.5)
WBC: 3.2 10*3/uL — ABNORMAL LOW (ref 4.0–10.5)
nRBC: 0 % (ref 0.0–0.2)

## 2020-01-19 LAB — BASIC METABOLIC PANEL
Anion gap: 7 (ref 5–15)
BUN: 12 mg/dL (ref 8–23)
CO2: 20 mmol/L — ABNORMAL LOW (ref 22–32)
Calcium: 8.7 mg/dL — ABNORMAL LOW (ref 8.9–10.3)
Chloride: 114 mmol/L — ABNORMAL HIGH (ref 98–111)
Creatinine, Ser: 1.14 mg/dL — ABNORMAL HIGH (ref 0.44–1.00)
GFR calc Af Amer: 59 mL/min — ABNORMAL LOW (ref >60)
GFR calc non Af Amer: 51 mL/min — ABNORMAL LOW (ref >60)
Glucose, Bld: 91 mg/dL (ref 70–99)
Potassium: 3.4 mmol/L — ABNORMAL LOW (ref 3.5–5.1)
Sodium: 141 mmol/L (ref 135–145)

## 2020-01-19 LAB — MAGNESIUM: Magnesium: 2.1 mg/dL (ref 1.7–2.4)

## 2020-01-19 MED ORDER — POTASSIUM CHLORIDE CRYS ER 20 MEQ PO TBCR
40.0000 meq | EXTENDED_RELEASE_TABLET | Freq: Once | ORAL | Status: AC
  Administered 2020-01-19: 40 meq via ORAL
  Filled 2020-01-19: qty 4

## 2020-01-19 MED ORDER — BISACODYL 10 MG RE SUPP
10.0000 mg | Freq: Once | RECTAL | Status: DC
  Filled 2020-01-19: qty 1

## 2020-01-19 MED ORDER — PROMETHAZINE HCL 12.5 MG PO TABS
12.5000 mg | ORAL_TABLET | ORAL | Status: AC
  Administered 2020-01-19: 12.5 mg via ORAL
  Filled 2020-01-19: qty 1

## 2020-01-19 MED ORDER — HYOSCYAMINE SULFATE 0.125 MG SL SUBL: 0.1250 mg | SUBLINGUAL_TABLET | Freq: Three times a day (TID) | SUBLINGUAL | Status: DC | PRN

## 2020-01-19 NOTE — Progress Notes (Signed)
Subjective:  Patient complains of nausea worsened after she prepped for CT enterography.  She only drank 1-1/2 bottle.  She complains of pain in left lower and left mid abdomen.  She has not had a bowel movement since hospitalization.  She had explosive diarrhea 1 day prior to admission 45 minutes after eating salad.  She has had nausea left-sided abdominal pain ever since.  She has passed some flatus.   Patient tested positive for Covid back in February 2021 testing was done because she had lost sense of taste and smell and her husband had some symptoms.  She fully recovered after 5 days.  Patient states she has had low platelets for few years but low white cell count was just discovered recently and she was seen by Dr. Laurena Slimmer last week and had blood work. I called their office and they kindly faxed report which is reviewed under lab data.  Current Medications:  Current Facility-Administered Medications:    baclofen (LIORESAL) tablet 10 mg, 10 mg, Oral, BID, Truett Mainland, DO, 10 mg at 01/17/20 2202   buPROPion [facility] SR) 12 hr tablet 100 mg, 100 mg, Oral, BID, Truett Mainland, DO, 100 mg at 01/19/20 4403   butalbital-acetaminophen-caffeine (FIORICET) 50-325-40 MG per tablet 1 tablet, 1 tablet, Oral, Q6H PRN, Manuella Ghazi, Pratik D, DO, 1 tablet at 01/17/20 1028   FLUoxetine (PROZAC) capsule 20 mg, 20 mg, Oral, Daily, Stinson, Jacob J, DO   ketorolac (TORADOL) 30 MG/ML injection 15 mg, 15 mg, Intravenous, Q6H PRN, Manuella Ghazi, Pratik D, DO, 15 mg at 01/18/20 2124   levothyroxine (SYNTHROID) tablet 50 mcg, 50 mcg, Oral, Q0600, Truett Mainland, DO, 50 mcg at 01/19/20 4742   morphine 2 MG/ML injection 0.5 mg, 0.5 mg, Intravenous, Q3H PRN, Manuella Ghazi, Pratik D, DO   oxyCODONE (Oxy IR/ROXICODONE) immediate release tablet 5 mg, 5 mg, Oral, Q4H PRN, Stinson, Jacob J, DO   phenazopyridine (PYRIDIUM) tablet 100 mg, 100 mg, Oral, TID WC, Shah, Pratik D, DO, 100 mg at 01/19/20 5956   promethazine  (PHENERGAN) injection 12.5 mg, 12.5 mg, Intravenous, Q6H PRN, Manuella Ghazi, Pratik D, DO, 12.5 mg at 01/19/20 0930   promethazine (PHENERGAN) tablet 12.5 mg, 12.5 mg, Oral, NOW, Anjolie Majer U, MD   SUMAtriptan (IMITREX) tablet 100 mg, 100 mg, Oral, Once PRN, Manuella Ghazi, Pratik D, DO   topiramate (TOPAMAX) tablet 150 mg, 150 mg, Oral, QHS, Stinson, Jacob J, DO, 150 mg at 01/18/20 2121   zolpidem (AMBIEN) tablet 5 mg, 5 mg, Oral, QHS PRN, Truett Mainland, DO, 5 mg at 01/18/20 2121   Objective: Blood pressure (!) 110/59, pulse (!) 57, temperature 97.9 F (36.6 C), resp. rate 18, height '5\' 4"'$  (1.626 m), weight 67.9 kg, SpO2 97 %. Patient is alert and in no acute distress She does not have neck masses or adenopathy. Abdomen is symmetrical.  Bowel sounds are normal.  On palpation is soft.  She has mild tenderness in left lower quadrant and left midabdomen laterally on deep palpation with some guarding but no rebound.  No organomegaly or masses.   Labs/studies Results:  CBC Latest Ref Rng & Units 01/19/2020 01/18/2020 01/17/2020  WBC 4.0 - 10.5 K/uL 3.2(L) 2.4(L) 2.8(L)  Hemoglobin 12.0 - 15.0 g/dL 12.1 11.6(L) 11.6(L)  Hematocrit 36 - 46 % 36.4 35.7(L) 35.7(L)  Platelets 150 - 400 K/uL 106(L) 102(L) 96(L)    CMP Latest Ref Rng & Units 01/19/2020 01/18/2020 01/17/2020  Glucose 70 - 99 mg/dL 91 84 85  BUN 8 -  23 mg/dL '12 8 14  '$ Creatinine 0.44 - 1.00 mg/dL 1.14(H) 1.04(H) 1.14(H)  Sodium 135 - 145 mmol/L 141 141 142  Potassium 3.5 - 5.1 mmol/L 3.4(L) 3.6 4.3  Chloride 98 - 111 mmol/L 114(H) 115(H) 118(H)  CO2 22 - 32 mmol/L 20(L) 19(L) 19(L)  Calcium 8.9 - 10.3 mg/dL 8.7(L) 8.5(L) 8.1(L)  Total Protein 6.5 - 8.1 g/dL - - -  Total Bilirubin 0.3 - 1.2 mg/dL - - -  Alkaline Phos 38 - 126 U/L - - -  AST 15 - 41 U/L - - -  ALT 0 - 44 U/L - - -    Hepatic Function Latest Ref Rng & Units 01/16/2020  Total Protein 6.5 - 8.1 g/dL 6.6  Albumin 3.5 - 5.0 g/dL 4.1  AST 15 - 41 U/L 19  ALT 0 - 44 U/L 16  Alk  Phosphatase 38 - 126 U/L 69  Total Bilirubin 0.3 - 1.2 mg/dL 0.4    Repeat abdominal pelvic CT with volume and negative oral contrast. Study reviewed with Dr. Thornton Papas.  There is a fair amount of stool in descending and sigmoid colon. Small bowel loops may be prominent but there is no hyperenhancement. Foreign bodies/pills noted in stomach and small bowel.  CBC from 01/13/2020 WBC 4.05 H&H 12.8 and 39.5 Platelet count 116 K  Stool culture from 01/14/2020 Negative for Campylobacter, Salmonella, E. coli and Yersinia.  Shiga toxins negative  Lab data from 01/15/2020  Folate 23.8 B12 318  Assessment:  #1.  Patient's acute symptoms i.e. ileus most likely due to food poisoning as symptoms triggered soon after she ate salad also resulting in explosive diarrhea.  Patient did have loose stool after I left patient's room.  Therefore will repeat stool studies.  Leukopenia may suggest viral illness.  WBC coming up but not normal  #2.  Left-sided abdominal pain.  Etiology unclear but no evidence of colonic wall thickening on CT from 3 days ago and one from this morning.  May be part and parcel of her acute illness.  Will treat with Levsin SL on as-needed basis.  #3.  History of thrombocytopenia.  To be further evaluated by Dr. Laurena Slimmer who saw patient 01/15/2020.   Recommendations  GI pathogen panel since she has loose stool. Advance diet to heart healthy. Levsin SL 3 times daily as needed. CBC with differential in AM.

## 2020-01-19 NOTE — Progress Notes (Signed)
PROGRESS NOTE    MALCOLM HETZ  UYQ:[id number] DOB: 09/15/1955 DOA: 01/16/2020 PCP: Mardella Layman, MD   Brief Narrative:  Per HPI: Rozlyn Yerby Cookis a 64 y.o.femalewith a history of hypothyroidism, GERD, migraines. Patient seen for left lower quadrant pain for the past 3 days. In addition, she has been having diarrhea or loose stools for the past 3 to 4 weeks. Pain has been intermittent and radiates down into her thigh and into her left flank. Pain is improved with laying down andis also improved by eating bland food, such as the brat diet. Pain worse with eating regular food.No improvement with passing stool or flatus.  She has been on penicillin and Bactrim for the past 3 to 4 days due to UTI.  -Patient was admitted with some signs of ileus on imaging. There is also concern for diarrhea in the setting of recent antibiotic use.  8/14: Ileus appears to be clinically resolving and patient will be advanced to full liquid diet. Seen by general surgery with no plans for intervention at this time.  8/15: Patient's diet has been advanced and she continues to have abdominal pain after eating. She denies any further nausea or vomiting. Overall appears to be doing better. Appreciate GI evaluation with plans for further imaging today.  8/16: CT of the abdomen pelvis performed with stool burden noted to the left colon.  She has had an episode of nausea after CT prep.  GI recommending GI pathogen panel and diet has been advanced.  Levsin added as needed.  Plan for CBC in a.m.  Assessment & Plan:   Active Problems:   Migraine, unspecified, intractable, with status migrainosus   Acid reflux   Ileus (HCC)   GERD (gastroesophageal reflux disease)   Hypothyroid   Ileus and left lower quadrant abdominal pain possibly related to food poisoning -Likely a viral illness with GI pathogen panel ordered by GI, pending -Continue monitoring -Diet advanced to heart healthy -Levsin sublingual  3 times daily as needed  Diarrhea -GI pathogen panel as noted above -Levsin as needed  GERD -Allergic to famotidine and PPI -Continue monitor  Hypothyroidism -Resume levothyroxine -TSH 1.911  Headache with history of migraines-improved -Resume sumatriptan -Fioricet as needed  Chronic thrombocytopenia -Plan to evaluate outpatient with Dr. Lucienne Minks  DVT prophylaxis:SCDs Code Status:Full code Family Communication:None at bedside Disposition Plan: Status is: Inpatient  Remains inpatient appropriate because:Ongoing diagnostic testing needed not appropriate for outpatient work up and IV treatments appropriate due to intensity of illness or inability to take PO   Dispo: The patient is from: Home  Anticipated d/c is to: Home  Anticipated d/c date is: 1 day  Patient currently is not medically stable to d/c. Patient continues to have continued abdominal pain with eating that requires evaluation with imaging.  Consultants:  General surgery  GI  Procedures:  See below  Antimicrobials:  Anti-infectives (From admission, onward)   None       Subjective: Patient seen and evaluated today with some ongoing left lower quadrant abdominal pain and noted nausea after drinking contrast prep for CT study.  Objective: Vitals:   01/18/20 1500 01/18/20 2149 01/19/20 0516 01/19/20 1455  BP: (!) 109/57 117/70 (!) 110/59 103/62  Pulse: 67 67 (!) 57 76  Resp: 18 17 18    Temp: 98.2 F (36.8 C) 98.6 F (37 C) 97.9 F (36.6 C) 98.2 F (36.8 C)  TempSrc: Oral   Oral  SpO2: 97% 97% 97% 99%  Weight:  Height:       No intake or output data in the 24 hours ending 01/19/20 1523 Filed Weights   01/16/20 1133 01/16/20 2330  Weight: 64 kg 67.9 kg    Examination:  General exam: Appears calm and comfortable  Respiratory system: Clear to auscultation. Respiratory effort normal. Cardiovascular system: S1 & S2 heard, RRR.   Gastrointestinal system: Abdomen is soft and minimally tender to palpation over left lower quadrant Central nervous system: Alert and oriented. No focal neurological deficits. Extremities: Symmetric 5 x 5 power. Skin: No rashes, lesions or ulcers Psychiatry: Judgement and insight appear normal. Mood & affect appropriate.     Data Reviewed: I have personally reviewed following labs and imaging studies  CBC: Recent Labs  Lab 01/16/20 1418 01/17/20 0624 01/18/20 0737 01/19/20 0545  WBC 3.1* 2.8* 2.4* 3.2*  NEUTROABS 1.8  --   --   --   HGB 13.2 11.6* 11.6* 12.1  HCT 40.4 35.7* 35.7* 36.4  MCV 90.4 92.5 90.4 89.4  PLT 112* 96* 102* 106*   Basic Metabolic Panel: Recent Labs  Lab 01/16/20 1418 01/17/20 0624 01/18/20 0737 01/19/20 0545  NA 139 142 141 141  K 3.9 4.3 3.6 3.4*  CL 112* 118* 115* 114*  CO2 19* 19* 19* 20*  GLUCOSE 97 85 84 91  BUN 20 14 8 12   CREATININE 1.16* 1.14* 1.04* 1.14*  CALCIUM 8.9 8.1* 8.5* 8.7*  MG  --   --  2.1 2.1   GFR: Estimated Creatinine Clearance: 47.8 mL/min (A) (by C-G formula based on SCr of 1.14 mg/dL (H)). Liver Function Tests: Recent Labs  Lab 01/16/20 1418  AST 19  ALT 16  ALKPHOS 69  BILITOT 0.4  PROT 6.6  ALBUMIN 4.1   Recent Labs  Lab 01/16/20 1418  LIPASE 25   No results for input(s): AMMONIA in the last 168 hours. Coagulation Profile: No results for input(s): INR, PROTIME in the last 168 hours. Cardiac Enzymes: No results for input(s): CKTOTAL, CKMB, CKMBINDEX, TROPONINI in the last 168 hours. BNP (last 3 results) No results for input(s): PROBNP in the last 8760 hours. HbA1C: No results for input(s): HGBA1C in the last 72 hours. CBG: No results for input(s): GLUCAP in the last 168 hours. Lipid Profile: No results for input(s): CHOL, HDL, LDLCALC, TRIG, CHOLHDL, LDLDIRECT in the last 72 hours. Thyroid Function Tests: Recent Labs    01/17/20 0624  TSH 1.911   Anemia Panel: No results for input(s):  VITAMINB12, FOLATE, FERRITIN, TIBC, IRON, RETICCTPCT in the last 72 hours. Sepsis Labs: No results for input(s): PROCALCITON, LATICACIDVEN in the last 168 hours.  Recent Results (from the past 240 hour(s))  SARS Coronavirus 2 by RT PCR (hospital order, performed in [facility] hospital lab) Nasopharyngeal Nasopharyngeal Swab     Status: None   Collection Time: 01/16/20  6:55 PM   Specimen: Nasopharyngeal Swab  Result Value Ref Range Status   SARS Coronavirus 2 NEGATIVE NEGATIVE Final    Comment: (NOTE) SARS-CoV-2 target nucleic acids are NOT DETECTED.  The SARS-CoV-2 RNA is generally detectable in upper and lower respiratory specimens during the acute phase of infection. The lowest concentration of SARS-CoV-2 viral copies this assay can detect is 250 copies / mL. A negative result does not preclude SARS-CoV-2 infection and should not be used as the sole basis for treatment or other patient management decisions.  A negative result may occur with improper specimen collection / handling, submission of specimen other than nasopharyngeal swab, presence  of viral mutation(s) within the areas targeted by this assay, and inadequate number of viral copies (<250 copies / mL). A negative result must be combined with clinical observations, patient history, and epidemiological information.  Fact Sheet for Patients:   BoilerBrush.com.cy  Fact Sheet for Healthcare Providers: https://pope.com/  This test is not yet approved or  cleared by the [place] FDA and has been authorized for detection and/or diagnosis of SARS-CoV-2 by FDA under an Emergency Use Authorization (EUA).  This EUA will remain in effect (meaning this test can be used) for the duration of the COVID-19 declaration under Section 564(b)(1) of the Act, 21 U.S.C. section 360bbb-3(b)(1), unless the authorization is terminated or revoked sooner.  Performed at [facility], [address] [place], [place] [address]          Radiology Studies: CT ABDOMEN PELVIS WO CONTRAST  Result Date: 01/19/2020 CLINICAL DATA:  Left lower quadrant abdominal pain. EXAM: CT ABDOMEN AND PELVIS WITHOUT CONTRAST TECHNIQUE: Multidetector CT imaging of the abdomen and pelvis was performed following the standard protocol without IV contrast. We did administer the patient Volumen negative oral contrast medium, but this cannot be considered a true CT enterography examination given the lack of IV contrast (assessment of bowel wall and particularly mucosal enhancement assessment is a an intrinsic part of enterography). IV contrast was not requested due to allergy. COMPARISON:  01/16/2020 FINDINGS: Lower chest: Borderline airway thickening. Stable 4 mm left lower lobe pulmonary nodule, image 6/5. Stable 3 mm left lower lobe pulmonary nodule, image 20/5. Mild descending thoracic aortic atherosclerotic calcification. Minimal dependent subsegmental atelectasis in the lung bases. Hepatobiliary: Unremarkable Pancreas: Unremarkable Spleen: Unremarkable Adrenals/Urinary Tract: Unremarkable Stomach/Bowel: Radiopaque pills are present in the gastric antrum. Mild prominence of formed stool in the colon suggesting constipation. No obvious inflammatory findings surround the terminal ileum. Mild fatty prominence of the ileocecal valve. Appendix poorly seen. There are a few nonspecific scattered air-fluid levels in nondilated loops of small bowel, not necessarily abnormal. No obvious findings of bowel wall thickening. Bowel wall fold pattern is difficult to assess given the lack of IV contrast. Overall no specific abnormality is identified. Vascular/Lymphatic: Mild atherosclerotic abdominal aortic wall calcification. Reproductive: Unremarkable Other: No supplemental non-categorized findings. Musculoskeletal: Schmorl's node along the superior endplate of L1. Transitional S1 segment. IMPRESSION: 1. Mild prominence of formed stool  in the colon suggesting constipation. 2. Borderline airway thickening is present, suggesting bronchitis or reactive airways disease. 3. Stable small left lower lobe pulmonary nodules, no change from 01/16/2020. No follow-up needed if patient is low-risk (and has no known or suspected primary neoplasm). Non-contrast chest CT can be considered in 12 months if patient is high-risk. This recommendation follows the consensus statement: Guidelines for Management of Incidental Pulmonary Nodules Detected on CT Images: From the Fleischner Society 2017; Radiology 2017; 284:228-243. 4. Aortic atherosclerosis. Aortic Atherosclerosis (ICD10-I70.0). Electronically Signed   By: Gaylyn Rong M.D.   On: 01/19/2020 11:21        Scheduled Meds: . baclofen  10 mg Oral BID  . buPROPion  100 mg Oral BID  . FLUoxetine  20 mg Oral Daily  . levothyroxine  50 mcg Oral Q0600  . phenazopyridine  100 mg Oral TID WC  . topiramate  150 mg Oral QHS    LOS: 2 days    Time spent: 30 minutes    Bryannah Boston Hoover Brunette, DO [facility]  If 7PM-7AM, please contact night-coverage [email or web address] 01/19/2020, 3:23 PM

## 2020-01-20 DIAGNOSIS — K219 Gastro-esophageal reflux disease without esophagitis: Secondary | ICD-10-CM

## 2020-01-20 LAB — GASTROINTESTINAL PANEL BY PCR, STOOL (REPLACES STOOL CULTURE)

## 2020-01-20 LAB — BASIC METABOLIC PANEL
Anion gap: 8 (ref 5–15)
BUN: 14 mg/dL (ref 8–23)
CO2: 19 mmol/L — ABNORMAL LOW (ref 22–32)
Calcium: 8.4 mg/dL — ABNORMAL LOW (ref 8.9–10.3)
Chloride: 113 mmol/L — ABNORMAL HIGH (ref 98–111)
Creatinine, Ser: 1.03 mg/dL — ABNORMAL HIGH (ref 0.44–1.00)
GFR calc Af Amer: >60 mL/min (ref >60)
GFR calc non Af Amer: 58 mL/min — ABNORMAL LOW (ref >60)
Glucose, Bld: 104 mg/dL — ABNORMAL HIGH (ref 70–99)
Potassium: 3.8 mmol/L (ref 3.5–5.1)
Sodium: 140 mmol/L (ref 135–145)

## 2020-01-20 LAB — CBC WITH DIFFERENTIAL/PLATELET
Abs Immature Granulocytes: 0.02 10*3/uL (ref 0.00–0.07)
Basophils Absolute: 0 10*3/uL (ref 0.0–0.1)
Basophils Relative: 1 %
Eosinophils Absolute: 0.1 10*3/uL (ref 0.0–0.5)
Eosinophils Relative: 3 %
HCT: 37.3 % (ref 36.0–46.0)
Hemoglobin: 12.1 g/dL (ref 12.0–15.0)
Immature Granulocytes: 1 %
Lymphocytes Relative: 21 %
Lymphs Abs: 0.9 10*3/uL (ref 0.7–4.0)
MCH: 29.7 pg (ref 26.0–34.0)
MCHC: 32.4 g/dL (ref 30.0–36.0)
MCV: 91.4 fL (ref 80.0–100.0)
Monocytes Absolute: 0.3 10*3/uL (ref 0.1–1.0)
Monocytes Relative: 8 %
Neutro Abs: 2.7 10*3/uL (ref 1.7–7.7)
Neutrophils Relative %: 66 %
Platelets: 105 10*3/uL — ABNORMAL LOW (ref 150–400)
RBC: 4.08 MIL/uL (ref 3.87–5.11)
RDW: 12.4 % (ref 11.5–15.5)
WBC: 4 10*3/uL (ref 4.0–10.5)
nRBC: 0 % (ref 0.0–0.2)

## 2020-01-20 LAB — MAGNESIUM: Magnesium: 2 mg/dL (ref 1.7–2.4)

## 2020-01-20 MED ORDER — HYOSCYAMINE SULFATE 0.125 MG SL SUBL: 0.1250 mg | SUBLINGUAL_TABLET | Freq: Three times a day (TID) | SUBLINGUAL | 0 refills | Status: DC | PRN

## 2020-01-20 NOTE — Discharge Summary (Signed)
Physician Discharge Summary  Sabrina Duke:[id number] DOB: [birth year] DOA: 01/16/2020  PCP: Sabrina Layman, MD  Admit date: 01/16/2020  Discharge date: 01/20/2020  Admitted From:Home  Disposition:  Home  Recommendations for Outpatient Follow-up:  1. Follow up with PCP in 1-2 weeks 2. Continue on Levsin as needed 3. Continue other home medications as prior 4. Follow-up with Dr. Conchita Paris for thrombocytopenia and leukopenia evaluation  Home Health: None  Equipment/Devices: None  Discharge Condition: Stable  CODE STATUS: Full  Diet recommendation: Heart Healthy  Brief/Interim Summary: Per HPI: Sabrina Duke a 64 y.o.femalewith a history of hypothyroidism, GERD, migraines. Patient seen for left lower quadrant pain for the past 3 days. In addition, she has been having diarrhea or loose stools for the past 3 to 4 weeks. Pain has been intermittent and radiates down into her thigh and into her left flank. Pain is improved with laying down andis also improved by eating bland food, such as the brat diet. Pain worse with eating regular food.No improvement with passing stool or flatus.  She has been on penicillin and Bactrim for the past 3 to 4 days due to UTI.  -Patient was admitted with some signs of ileus on imaging. There is also concern for diarrhea in the setting of recent antibiotic use.  8/14: Ileus appears to be clinically resolving and patient will be advanced to full liquid diet. Seen by general surgery with no plans for intervention at this time.  8/15:Patient's diet has been advanced and she continues to have abdominal pain after eating. She denies any further nausea or vomiting. Overall appears to be doing better. Appreciate GI evaluation with plans for further imaging today.  8/16: CT of the abdomen pelvis performed with stool burden noted to the left colon.  She has had an episode of nausea after CT prep.  GI recommending GI pathogen panel and diet has  been advanced.  Levsin added as needed.  Plan for CBC in a.m.  8/17: GI panel still pending, but patient without any further nausea, vomiting, or diarrhea.  She is having very mild stomach cramping, but is otherwise able to tolerate oral intake and is stable for discharge after discussion with GI.  No further work-up indicated at this time patient may remain on Levsin as needed for symptomatic relief.  It appears that she may have had food poisoning related to sudden onset of symptoms after eating a salad.  Her symptoms should be self-limiting with further improvement anticipated in the coming few days.  She is stable for discharge today with no other acute events noted throughout the course of this admission.  Discharge Diagnoses:  Active Problems:   Migraine, unspecified, intractable, with status migrainosus   Acid reflux   Ileus (HCC)   GERD (gastroesophageal reflux disease)   Hypothyroid  Principal discharge diagnosis: Left lower quadrant abdominal pain possibly related to food poisoning-improved  Discharge Instructions  Discharge Instructions    Diet - low sodium heart healthy   Complete by: As directed    Increase activity slowly   Complete by: As directed      Allergies as of 01/20/2020      Reactions   Ivp Dye [iodinated Diagnostic Agents] Anaphylaxis   Antihistamines, Loratadine-type Other (See Comments)   Sleepy   Ciprofloxacin Nausea And Vomiting   Dymista [azelastine-fluticasone] Other (See Comments)   Heachache   Lortab [hydrocodone-acetaminophen] Nausea And Vomiting   Macrobid [nitrofurantoin] Other (See Comments)   Unknown   Montelukast Other (See Comments)  Sleep   Nsaids    Pt can tolerate small amounts of Aleve "mess my stomach up, have had ulcer in the past"   Nurtec [rimegepant Sulfate] Nausea Only   Other    Patient not sure about these allergies Darvocet-  Mermantin- "makes me sick on my stomach" Bio Citrate Calcium with Vitamin D- "hurt stomach"    Pepcid [famotidine] Other (See Comments)   Headache   Prednisone    "makes me hyper and gives me a headache"   Prilosec [omeprazole] Other (See Comments)   Headache   Rosuvastatin Calcium Other (See Comments)   Headaches.   Vantin [cefpodoxime] Nausea And Vomiting   Zofran [ondansetron] Other (See Comments)   Causes headaches per pt      Medication List    STOP taking these medications   penicillin v potassium 500 MG tablet Commonly known as: VEETID     TAKE these medications   Ajovy 225 MG/1.5ML Soaj Generic drug: Fremanezumab-vfrm Inject 225 mg into the skin every 30 (thirty) days.   baclofen 10 MG tablet Commonly known as: LIORESAL Take 10 mg by mouth 2 (two) times daily.   buPROPion 100 MG 12 hr tablet Commonly known as: WELLBUTRIN SR TAKE 1 TABLET BY MOUTHTWICE A DAY. What changed: See the new instructions.   Eszopiclone 3 MG Tabs Commonly known as: eszopiclone Take 1 tablet (3 mg total) by mouth at bedtime as needed. Take immediately before bedtime   FLUoxetine 20 MG capsule Commonly known as: PROzac Take 1 capsule (20 mg total) by mouth daily.   hyoscyamine 0.125 MG SL tablet Commonly known as: LEVSIN SL Place 1 tablet (0.125 mg total) under the tongue 3 (three) times daily as needed (abdominal pain).   levothyroxine 50 MCG tablet Commonly known as: SYNTHROID Take 50 mcg by mouth daily before breakfast.   Magnesium 400 MG Tabs Take 400 mg by mouth at bedtime.   sodium chloride 0.65 % Soln nasal spray Commonly known as: OCEAN Place 1 spray into both nostrils every 4 (four) hours as needed for congestion.   SUMAtriptan 100 MG tablet Commonly known as: IMITREX Take 1 tablet (100 mg total) by mouth once as needed for up to 1 dose for migraine. May repeat in 2 hours if headache persists or recurs.   topiramate 50 MG tablet Commonly known as: TOPAMAX Take 3 tablets (150 mg total) by mouth at bedtime.   Vitamin D (Ergocalciferol) 1.25 MG (50000 UNIT)  Caps capsule Commonly known as: DRISDOL Take 50,000 Units by mouth once a week.       Follow-up Information    Sabrina Laymanripathi, Kavita, MD Follow up in 1 week(s).   Specialty: Family Medicine Contact information: [address] [place] [id number] [phone]              Allergies  Allergen Reactions  . Ivp Dye [Iodinated Diagnostic Agents] Anaphylaxis  . Antihistamines, Loratadine-Type Other (See Comments)    Sleepy  . Ciprofloxacin Nausea And Vomiting  . Dymista [Azelastine-Fluticasone] Other (See Comments)    Heachache  . Lortab [Hydrocodone-Acetaminophen] Nausea And Vomiting  . Macrobid [Nitrofurantoin] Other (See Comments)    Unknown  . Montelukast Other (See Comments)    Sleep  . Nsaids     Pt can tolerate small amounts of Aleve "mess my stomach up, have had ulcer in the past"  . Nurtec [Rimegepant Sulfate] Nausea Only  . Other     Patient not sure about these allergies  Darvocet-   Mermantin- "makes  me sick on my stomach"  Bio Citrate Calcium with Vitamin D- "hurt stomach"     . Pepcid [Famotidine] Other (See Comments)    Headache  . Prednisone     "makes me hyper and gives me a headache"  . Prilosec [Omeprazole] Other (See Comments)    Headache  . Rosuvastatin Calcium Other (See Comments)    Headaches.  Varney Baas [Cefpodoxime] Nausea And Vomiting  . Zofran [Ondansetron] Other (See Comments)    Causes headaches per pt    Consultations:  General surgery  GI   Procedures/Studies: CT ABDOMEN PELVIS WO CONTRAST  Result Date: 01/19/2020 CLINICAL DATA:  Left lower quadrant abdominal pain. EXAM: CT ABDOMEN AND PELVIS WITHOUT CONTRAST TECHNIQUE: Multidetector CT imaging of the abdomen and pelvis was performed following the standard protocol without IV contrast. We did administer the patient Volumen negative oral contrast medium, but this cannot be considered a true CT enterography examination given the lack of IV contrast (assessment of bowel wall  and particularly mucosal enhancement assessment is a an intrinsic part of enterography). IV contrast was not requested due to allergy. COMPARISON:  01/16/2020 FINDINGS: Lower chest: Borderline airway thickening. Stable 4 mm left lower lobe pulmonary nodule, image 6/5. Stable 3 mm left lower lobe pulmonary nodule, image 20/5. Mild descending thoracic aortic atherosclerotic calcification. Minimal dependent subsegmental atelectasis in the lung bases. Hepatobiliary: Unremarkable Pancreas: Unremarkable Spleen: Unremarkable Adrenals/Urinary Tract: Unremarkable Stomach/Bowel: Radiopaque pills are present in the gastric antrum. Mild prominence of formed stool in the colon suggesting constipation. No obvious inflammatory findings surround the terminal ileum. Mild fatty prominence of the ileocecal valve. Appendix poorly seen. There are a few nonspecific scattered air-fluid levels in nondilated loops of small bowel, not necessarily abnormal. No obvious findings of bowel wall thickening. Bowel wall fold pattern is difficult to assess given the lack of IV contrast. Overall no specific abnormality is identified. Vascular/Lymphatic: Mild atherosclerotic abdominal aortic wall calcification. Reproductive: Unremarkable Other: No supplemental non-categorized findings. Musculoskeletal: Schmorl's node along the superior endplate of L1. Transitional S1 segment. IMPRESSION: 1. Mild prominence of formed stool in the colon suggesting constipation. 2. Borderline airway thickening is present, suggesting bronchitis or reactive airways disease. 3. Stable small left lower lobe pulmonary nodules, no change from 01/16/2020. No follow-up needed if patient is low-risk (and has no known or suspected primary neoplasm). Non-contrast chest CT can be considered in 12 months if patient is high-risk. This recommendation follows the consensus statement: Guidelines for Management of Incidental Pulmonary Nodules Detected on CT Images: From the Fleischner  Society 2017; Radiology 2017; 284:228-243. 4. Aortic atherosclerosis. Aortic Atherosclerosis (ICD10-I70.0). Electronically Signed   By: Gaylyn Rong M.D.   On: 01/19/2020 11:21   CT ABDOMEN PELVIS WO CONTRAST  Result Date: 01/16/2020 CLINICAL DATA:  Left lower quadrant pain, states she is being treated for a UTI, since 01/12/2020 EXAM: CT ABDOMEN AND PELVIS WITHOUT CONTRAST TECHNIQUE: Multidetector CT imaging of the abdomen and pelvis was performed following the standard protocol without IV contrast. COMPARISON:  None. FINDINGS: Lower chest: No acute abnormality. Somewhat limited evaluation of the abdominal viscera given the lack of IV contrast. Hepatobiliary: No focal liver abnormality is seen. No gallstones, gallbladder wall thickening, or biliary dilatation. Pancreas: Unremarkable.  No surrounding inflammatory changes. Spleen: Normal in size without focal abnormality. Adrenals/Urinary Tract: Adrenal glands are unremarkable. Kidneys are symmetric in size. No renal calculi identified. No hydronephrosis. Urinary bladder is unremarkable. Stomach/Bowel: Stomach is within normal limits. There is a mildly dilated segment of small bowel  in the low central abdomen with smooth tapering on either side, possibly focal ileus. The bowel is otherwise unremarkable. Vascular/Lymphatic: Aortic atherosclerosis. No enlarged abdominal or pelvic lymph nodes. Reproductive: Uterus and bilateral adnexa are unremarkable. Other: No abdominal wall hernia or abnormality. No abdominopelvic ascites. Musculoskeletal: No acute finding. There is very mild vertebral height loss with a Schmorl's node at T12. IMPRESSION: 1. There is a mildly dilated segment of small bowel in the low central abdomen with smooth tapering on either side, possibly focal ileus. 2. No other acute finding on a noncontrast scan. 3. Aortic atherosclerosis. Aortic Atherosclerosis (ICD10-I70.0). Electronically Signed   By: Emmaline Kluver M.D.   On: 01/16/2020 15:01      Discharge Exam: Vitals:   01/19/20 2137 01/20/20 0511  BP: 130/71 112/66  Pulse: 76 68  Resp: 18 18  Temp: 98.1 F (36.7 C) 98 F (36.7 C)  SpO2: 95% 98%   Vitals:   01/19/20 0516 01/19/20 1455 01/19/20 2137 01/20/20 0511  BP: (!) 110/59 103/62 130/71 112/66  Pulse: (!) 57 76 76 68  Resp: 18  18 18   Temp: 97.9 F (36.6 C) 98.2 F (36.8 C) 98.1 F (36.7 C) 98 F (36.7 C)  TempSrc:  Oral  Oral  SpO2: 97% 99% 95% 98%  Weight:      Height:        General: Pt is alert, awake, not in acute distress Cardiovascular: RRR, S1/S2 +, no rubs, no gallops Respiratory: CTA bilaterally, no wheezing, no rhonchi Abdominal: Soft, NT, ND, bowel sounds + Extremities: no edema, no cyanosis    The results of significant diagnostics from this hospitalization (including imaging, microbiology, ancillary and laboratory) are listed below for reference.     Microbiology: Recent Results (from the past 240 hour(s))  SARS Coronavirus 2 by RT PCR (hospital order, performed in [facility] hospital lab) Nasopharyngeal Nasopharyngeal Swab     Status: None   Collection Time: 01/16/20  6:55 PM   Specimen: Nasopharyngeal Swab  Result Value Ref Range Status   SARS Coronavirus 2 NEGATIVE NEGATIVE Final    Comment: (NOTE) SARS-CoV-2 target nucleic acids are NOT DETECTED.  The SARS-CoV-2 RNA is generally detectable in upper and lower respiratory specimens during the acute phase of infection. The lowest concentration of SARS-CoV-2 viral copies this assay can detect is 250 copies / mL. A negative result does not preclude SARS-CoV-2 infection and should not be used as the sole basis for treatment or other patient management decisions.  A negative result may occur with improper specimen collection / handling, submission of specimen other than nasopharyngeal swab, presence of viral mutation(s) within the areas targeted by this assay, and inadequate number of viral copies (<250 copies / mL). A  negative result must be combined with clinical observations, patient history, and epidemiological information.  Fact Sheet for Patients:   01/18/20  Fact Sheet for Healthcare Providers: BoilerBrush.com.cy  This test is not yet approved or  cleared by the https://pope.com/ FDA and has been authorized for detection and/or diagnosis of SARS-CoV-2 by FDA under an Emergency Use Authorization (EUA).  This EUA will remain in effect (meaning this test can be used) for the duration of the COVID-19 declaration under Section 564(b)(1) of the Act, 21 U.S.C. section 360bbb-3(b)(1), unless the authorization is terminated or revoked sooner.  Performed at [facility], [address], [place], [place]      Labs: BNP (last 3 results) No results for input(s): BNP in the last 8760 hours.  Basic Metabolic Panel: Recent Labs  Lab 01/16/20 1418 01/17/20 0624 01/18/20 0737 01/19/20 0545 01/20/20 0354  NA 139 142 141 141 140  K 3.9 4.3 3.6 3.4* 3.8  CL 112* 118* 115* 114* 113*  CO2 19* 19* 19* 20* 19*  GLUCOSE 97 85 84 91 104*  BUN 20 14 8 12 14   CREATININE 1.16* 1.14* 1.04* 1.14* 1.03*  CALCIUM 8.9 8.1* 8.5* 8.7* 8.4*  MG  --   --  2.1 2.1 2.0   Liver Function Tests: Recent Labs  Lab 01/16/20 1418  AST 19  ALT 16  ALKPHOS 69  BILITOT 0.4  PROT 6.6  ALBUMIN 4.1   Recent Labs  Lab 01/16/20 1418  LIPASE 25   No results for input(s): AMMONIA in the last 168 hours. CBC: Recent Labs  Lab 01/16/20 1418 01/17/20 0624 01/18/20 0737 01/19/20 0545 01/20/20 0354  WBC 3.1* 2.8* 2.4* 3.2* 4.0  NEUTROABS 1.8  --   --   --  2.7  HGB 13.2 11.6* 11.6* 12.1 12.1  HCT 40.4 35.7* 35.7* 36.4 37.3  MCV 90.4 92.5 90.4 89.4 91.4  PLT 112* 96* 102* 106* 105*   Cardiac Enzymes: No results for input(s): CKTOTAL, CKMB, CKMBINDEX, TROPONINI in the last 168 hours. BNP: Invalid input(s): POCBNP CBG: No results for input(s): GLUCAP in  the last 168 hours. D-Dimer No results for input(s): DDIMER in the last 72 hours. Hgb A1c No results for input(s): HGBA1C in the last 72 hours. Lipid Profile No results for input(s): CHOL, HDL, LDLCALC, TRIG, CHOLHDL, LDLDIRECT in the last 72 hours. Thyroid function studies No results for input(s): TSH, T4TOTAL, T3FREE, THYROIDAB in the last 72 hours.  Invalid input(s): FREET3 Anemia work up No results for input(s): VITAMINB12, FOLATE, FERRITIN, TIBC, IRON, RETICCTPCT in the last 72 hours. Urinalysis    Component Value Date/Time   COLORURINE AMBER (A) 01/16/2020 1153   APPEARANCEUR CLEAR 01/16/2020 1153   LABSPEC 1.018 01/16/2020 1153   PHURINE 6.0 01/16/2020 1153   GLUCOSEU NEGATIVE 01/16/2020 1153   HGBUR NEGATIVE 01/16/2020 1153   BILIRUBINUR NEGATIVE 01/16/2020 1153   KETONESUR NEGATIVE 01/16/2020 1153   PROTEINUR NEGATIVE 01/16/2020 1153   NITRITE NEGATIVE 01/16/2020 1153   LEUKOCYTESUR SMALL (A) 01/16/2020 1153   Sepsis Labs Invalid input(s): PROCALCITONIN,  WBC,  LACTICIDVEN Microbiology Recent Results (from the past 240 hour(s))  SARS Coronavirus 2 by RT PCR (hospital order, performed in [facility] hospital lab) Nasopharyngeal Nasopharyngeal Swab     Status: None   Collection Time: 01/16/20  6:55 PM   Specimen: Nasopharyngeal Swab  Result Value Ref Range Status   SARS Coronavirus 2 NEGATIVE NEGATIVE Final    Comment: (NOTE) SARS-CoV-2 target nucleic acids are NOT DETECTED.  The SARS-CoV-2 RNA is generally detectable in upper and lower respiratory specimens during the acute phase of infection. The lowest concentration of SARS-CoV-2 viral copies this assay can detect is 250 copies / mL. A negative result does not preclude SARS-CoV-2 infection and should not be used as the sole basis for treatment or other patient management decisions.  A negative result may occur with improper specimen collection / handling, submission of specimen other than nasopharyngeal swab,  presence of viral mutation(s) within the areas targeted by this assay, and inadequate number of viral copies (<250 copies / mL). A negative result must be combined with clinical observations, patient history, and epidemiological information.  Fact Sheet for Patients:   01/18/20  Fact Sheet for Healthcare Providers: BoilerBrush.com.cy  This test is  not yet approved or  cleared by the [place] and has been authorized for detection and/or diagnosis of SARS-CoV-2 by FDA under an Emergency Use Authorization (EUA).  This EUA will remain in effect (meaning this test can be used) for the duration of the COVID-19 declaration under Section 564(b)(1) of the Act, 21 U.S.C. section 360bbb-3(b)(1), unless the authorization is terminated or revoked sooner.  Performed at [facility], [address] [place], [place] [address]      Time coordinating discharge: 35 minutes  SIGNED:   Erick Blinks, DO [facility] 01/20/2020, 10:28 AM  If 7PM-7AM, please contact night-coverage [email or web address]

## 2020-01-20 NOTE — Progress Notes (Signed)
Nsg Discharge Note  Admit Date:  01/16/2020 Discharge date: 01/20/2020   Osie Bond to be D/C'd Home per MD order.  AVS completed.  Copy for chart, and copy for patient signed, and dated. Patient/caregiver able to verbalize understanding.  Discharge Medication: Allergies as of 01/20/2020      Reactions   Ivp Dye [iodinated Diagnostic Agents] Anaphylaxis   Antihistamines, Loratadine-type Other (See Comments)   Sleepy   Ciprofloxacin Nausea And Vomiting   Dymista [azelastine-fluticasone] Other (See Comments)   Heachache   Lortab [hydrocodone-acetaminophen] Nausea And Vomiting   Macrobid [nitrofurantoin] Other (See Comments)   Unknown   Montelukast Other (See Comments)   Sleep   Nsaids    Pt can tolerate small amounts of Aleve "mess my stomach up, have had ulcer in the past"   Nurtec [rimegepant Sulfate] Nausea Only   Other    Patient not sure about these allergies Darvocet-  Mermantin- "makes me sick on my stomach" Bio Citrate Calcium with Vitamin D- "hurt stomach"   Pepcid [famotidine] Other (See Comments)   Headache   Prednisone    "makes me hyper and gives me a headache"   Prilosec [omeprazole] Other (See Comments)   Headache   Rosuvastatin Calcium Other (See Comments)   Headaches.   Vantin [cefpodoxime] Nausea And Vomiting   Zofran [ondansetron] Other (See Comments)   Causes headaches per pt      Medication List    STOP taking these medications   penicillin v potassium 500 MG tablet Commonly known as: VEETID     TAKE these medications   Ajovy 225 MG/1.5ML Soaj Generic drug: Fremanezumab-vfrm Inject 225 mg into the skin every 30 (thirty) days.   baclofen 10 MG tablet Commonly known as: LIORESAL Take 10 mg by mouth 2 (two) times daily.   buPROPion 100 MG 12 hr tablet Commonly known as: WELLBUTRIN SR TAKE 1 TABLET BY MOUTHTWICE A DAY. What changed: See the new instructions.   Eszopiclone 3 MG Tabs Commonly known as: eszopiclone Take 1 tablet (3 mg  total) by mouth at bedtime as needed. Take immediately before bedtime   FLUoxetine 20 MG capsule Commonly known as: PROzac Take 1 capsule (20 mg total) by mouth daily.   hyoscyamine 0.125 MG SL tablet Commonly known as: LEVSIN SL Place 1 tablet (0.125 mg total) under the tongue 3 (three) times daily as needed (abdominal pain).   levothyroxine 50 MCG tablet Commonly known as: SYNTHROID Take 50 mcg by mouth daily before breakfast.   Magnesium 400 MG Tabs Take 400 mg by mouth at bedtime.   sodium chloride 0.65 % Soln nasal spray Commonly known as: OCEAN Place 1 spray into both nostrils every 4 (four) hours as needed for congestion.   SUMAtriptan 100 MG tablet Commonly known as: IMITREX Take 1 tablet (100 mg total) by mouth once as needed for up to 1 dose for migraine. May repeat in 2 hours if headache persists or recurs.   topiramate 50 MG tablet Commonly known as: TOPAMAX Take 3 tablets (150 mg total) by mouth at bedtime.   Vitamin D (Ergocalciferol) 1.25 MG (50000 UNIT) Caps capsule Commonly known as: DRISDOL Take 50,000 Units by mouth once a week.       Discharge Assessment: Vitals:   01/19/20 2137 01/20/20 0511  BP: 130/71 112/66  Pulse: 76 68  Resp: 18 18  Temp: 98.1 F (36.7 C) 98 F (36.7 C)  SpO2: 95% 98%   Skin clean, dry and intact without evidence of  skin break down, no evidence of skin tears noted. IV catheter discontinued intact. Site without signs and symptoms of complications - no redness or edema noted at insertion site, patient denies c/o pain - only slight tenderness at site.  Dressing with slight pressure applied.  D/c Instructions-Education: Discharge instructions given to patient/family with verbalized understanding. D/c education completed with patient/family including follow up instructions, medication list, d/c activities limitations if indicated, with other d/c instructions as indicated by MD - patient able to verbalize understanding, all  questions fully answered. Patient instructed to return to ED, call 911, or call MD for any changes in condition.  Patient escorted via WC, and D/C home via private auto.  Brandy Hale, LPN 1/00/7121 9:75 PM

## 2020-01-21 ENCOUNTER — Ambulatory Visit: Payer: BC Managed Care – PPO | Admitting: Psychiatry

## 2020-01-22 ENCOUNTER — Telehealth (INDEPENDENT_AMBULATORY_CARE_PROVIDER_SITE_OTHER): Payer: Self-pay | Admitting: Internal Medicine

## 2020-01-22 DIAGNOSIS — R112 Nausea with vomiting, unspecified: Secondary | ICD-10-CM

## 2020-01-22 NOTE — Telephone Encounter (Signed)
Patient called for a hospital follow up appointment (which she was given) - also stated she needs something for nausea - she cant take Zofran - give her headaches - please advise - ph# [phone]

## 2020-01-23 MED ORDER — PROMETHAZINE HCL 25 MG PO TABS: 25.0000 mg | ORAL_TABLET | Freq: Four times a day (QID) | ORAL | 0 refills | Status: DC | PRN

## 2020-01-23 NOTE — Telephone Encounter (Signed)
Thank you :)

## 2020-01-23 NOTE — Telephone Encounter (Signed)
Patient is requesting medication for nausea  , she cannot take the Zofran as it causes headaches Please advise.

## 2020-01-23 NOTE — Telephone Encounter (Signed)
Sent prescription for promethazine.  Thanks,  Katrinka Blazing, MD Gastroenterology and Hepatology [facility]

## 2020-01-26 ENCOUNTER — Encounter (INDEPENDENT_AMBULATORY_CARE_PROVIDER_SITE_OTHER): Payer: Self-pay | Admitting: Internal Medicine

## 2020-01-26 ENCOUNTER — Other Ambulatory Visit: Payer: Self-pay

## 2020-01-26 ENCOUNTER — Ambulatory Visit (INDEPENDENT_AMBULATORY_CARE_PROVIDER_SITE_OTHER): Payer: BC Managed Care – PPO | Admitting: Internal Medicine

## 2020-01-26 VITALS — BP 109/73 | HR 72 | Temp 98.5°F | Ht 64.0 in | Wt 143.1 lb

## 2020-01-26 DIAGNOSIS — Z8719 Personal history of other diseases of the digestive system: Secondary | ICD-10-CM

## 2020-01-26 DIAGNOSIS — R110 Nausea: Secondary | ICD-10-CM

## 2020-01-26 DIAGNOSIS — K219 Gastro-esophageal reflux disease without esophagitis: Secondary | ICD-10-CM | POA: Diagnosis not present

## 2020-01-26 MED ORDER — SUCRALFATE 1 G PO TABS: 1.0000 g | ORAL_TABLET | Freq: Four times a day (QID) | ORAL | 5 refills | Status: DC | PRN

## 2020-01-26 NOTE — Patient Instructions (Addendum)
Please call office with progress report in a month or so.  If nausea worsens will consider gastric emptying study. Try taking sucralfate/Carafate 1 to 2 g daily at bedtime for a week or 2 and see if it prevents you from having nausea on waking up in the morning. Increase dietary fiber as tolerated.

## 2020-01-26 NOTE — Progress Notes (Signed)
Presenting complaint;  Follow-up to recent hospitalization for ileus.  Database and subjective:  Patient is 64 year old Caucasian female who was admitted to [facility] about 2 weeks ago for 4-week history of diarrhea and abdominal pain.  Prior to onset of the symptoms she was treated with 2 antibiotics for urinary tract infection.  She did have stool studies and these were negative.  Evaluation in emergency room on 01/16/2020 led to abdominal pelvic CT which revealed mildly dilated loops of small bowel concerning for ileus.  She did not have wall thickening ascites.  She was admitted to hospitalist service.  During that visit she was seen by Dr. Aviva Signs of surgical service as well as Dr. Gala Romney who is on call over the weekend.  Her recovery was slow.  CT enterography was planned but study was done without IV contrast because of contrast allergy.  No abnormality was noted to small bowel.  She was noted to have stool in the colon and small pulmonary nodules.  Follow-up CT was recommended in a year. Covid test was negative and so was GI pathogen panel.  Her diet was advanced and she was able to tolerate it. She gradually improved and was able to go home.  Patient says she is feeling better but not back to her baseline.  She has daily nausea.  Nausea is worse after meals.  She has not had any vomiting.  She has not had abdominal pain in the last 3 days.  Her nausea may last for about an hour.  She has noted intermittent heartburn.  She is intolerant of proton pump inhibitors or histamine 2 blockers because of headache.  She has tried sucralfate in the past which helps.  She remains with migraine.  She has 2-3 episodes per week.  She remains with irregular bowel habits.  She has more constipation than diarrhea.  She feels magnesium that she is taking for migraine may be helping.  She also reports having had nosebleeds.  She had 1 over the weekend.  She says nosebleed started after she had Covid back  in January 2021.  She is being evaluated by hematologist.  She also complains of back pain resulting for compression fracture to 1 of vertebra which occurred back in May this year when she bent and felt a pinch in her back.  She is getting ready to receive Prolia infusion.  She says she has not lost any weight despite her illness.   Current Medications: Outpatient Encounter Medications as of 01/26/2020  Medication Sig  . buPROPion (WELLBUTRIN SR) 100 MG 12 hr tablet TAKE 1 TABLET BY MOUTHTWICE A DAY. (Patient taking differently: Take 100 mg by mouth 2 (two) times daily. )  . Eszopiclone (ESZOPICLONE) 3 MG TABS Take 1 tablet (3 mg total) by mouth at bedtime as needed. Take immediately before bedtime  . levothyroxine (SYNTHROID) 50 MCG tablet Take 50 mcg by mouth daily before breakfast.  . Magnesium 400 MG TABS Take 400 mg by mouth at bedtime.  . promethazine (PHENERGAN) 25 MG tablet Take 1 tablet (25 mg total) by mouth every 6 (six) hours as needed for up to 10 days for nausea or vomiting.  . SUMAtriptan (IMITREX) 100 MG tablet Take 1 tablet (100 mg total) by mouth once as needed for up to 1 dose for migraine. May repeat in 2 hours if headache persists or recurs.  . topiramate (TOPAMAX) 50 MG tablet Take 3 tablets (150 mg total) by mouth at bedtime.  . Vitamin  D, Ergocalciferol, (DRISDOL) 1.25 MG (50000 UNIT) CAPS capsule Take 50,000 Units by mouth once a week.  . baclofen (LIORESAL) 10 MG tablet Take 10 mg by mouth 2 (two) times daily. (Patient not taking: Reported on 01/26/2020)  . FLUoxetine (PROZAC) 20 MG capsule Take 1 capsule (20 mg total) by mouth daily. (Patient not taking: Reported on 01/26/2020)  . Fremanezumab-vfrm (AJOVY) 225 MG/1.5ML SOAJ Inject 225 mg into the skin every 30 (thirty) days. (Patient not taking: Reported on 01/16/2020)  . hyoscyamine (LEVSIN SL) 0.125 MG SL tablet Place 1 tablet (0.125 mg total) under the tongue 3 (three) times daily as needed (abdominal pain).  . sodium  chloride (OCEAN) 0.65 % SOLN nasal spray Place 1 spray into both nostrils every 4 (four) hours as needed for congestion. (Patient not taking: Reported on 01/26/2020)   No facility-administered encounter medications on file as of 01/26/2020.     Objective: Blood pressure 109/73, pulse 72, temperature 98.5 F (36.9 C), temperature source Oral, height $RemoveBefo'5\' 4"'hOLKJKIrJSi$  (1.626 m), weight 143 lb 1.6 oz (64.9 kg). Patient is alert and in no acute distress. She is wearing a mask. Conjunctiva is pink. Sclera is nonicteric Oropharyngeal mucosa is normal. No neck masses or thyromegaly noted. Cardiac exam with regular rhythm normal S1 and S2. No murmur or gallop noted. Lungs are clear to auscultation. Abdomen is symmetrical.  Bowel sounds are normal.  On palpation abdomen is soft.  She has mild tenderness in left upper lower quadrant of her abdomen.  There is no guarding or rebound.  No organomegaly or masses. No LE edema or clubbing noted.  Labs/studies Results:  CBC Latest Ref Rng & Units 01/20/2020 01/19/2020 01/18/2020  WBC 4.0 - 10.5 K/uL 4.0 3.2(L) 2.4(L)  Hemoglobin 12.0 - 15.0 g/dL 12.1 12.1 11.6(L)  Hematocrit 36 - 46 % 37.3 36.4 35.7(L)  Platelets 150 - 400 K/uL 105(L) 106(L) 102(L)    CMP Latest Ref Rng & Units 01/20/2020 01/19/2020 01/18/2020  Glucose 70 - 99 mg/dL 104(H) 91 84  BUN 8 - 23 mg/dL $Remove'14 12 8  'QbjytLy$ Creatinine 0.44 - 1.00 mg/dL 1.03(H) 1.14(H) 1.04(H)  Sodium 135 - 145 mmol/L 140 141 141  Potassium 3.5 - 5.1 mmol/L 3.8 3.4(L) 3.6  Chloride 98 - 111 mmol/L 113(H) 114(H) 115(H)  CO2 22 - 32 mmol/L 19(L) 20(L) 19(L)  Calcium 8.9 - 10.3 mg/dL 8.4(L) 8.7(L) 8.5(L)  Total Protein 6.5 - 8.1 g/dL - - -  Total Bilirubin 0.3 - 1.2 mg/dL - - -  Alkaline Phos 38 - 126 U/L - - -  AST 15 - 41 U/L - - -  ALT 0 - 44 U/L - - -    Hepatic Function Latest Ref Rng & Units 01/16/2020  Total Protein 6.5 - 8.1 g/dL 6.6  Albumin 3.5 - 5.0 g/dL 4.1  AST 15 - 41 U/L 19  ALT 0 - 44 U/L 16  Alk Phosphatase 38 -  126 U/L 69  Total Bilirubin 0.3 - 1.2 mg/dL 0.4      Assessment:  #1.  Nausea without vomiting most likely due to recent bout of illness that she had which started with nonbloody diarrhea abdominal pain/ileus.  GI pathogen panel was negative.  Suspect viral illness with slow recovery.  I wonder if migraines also contributing to her nausea.  She had EGD in October 2020 and was negative for peptic ulcer disease or H. pylori gastritis.  #2.  GERD.  EGD in October last year revealed mild changes of reflux esophagitis  limited to GE junction.  She is intolerant of PPI and H2B.  Sucralfate has helped and she can use it on as-needed basis.  #3.  Irritable bowel syndrome.  She seems to be doing better.  She can try increasing fiber in diet and see if it helps.   Plan:  Sucralfate 1 g 4 times daily as needed. She may also try taking 1 to 2 g daily at bedtime for a week or so and see if it prevents a.m. nausea. Patient will call with progress report regarding nausea in 1 month.  If nausea persist we will proceed with solid-phase gastric emptying study. Office visit in 6 months.

## 2020-01-27 ENCOUNTER — Other Ambulatory Visit: Payer: Self-pay | Admitting: Psychiatry

## 2020-01-27 DIAGNOSIS — F330 Major depressive disorder, recurrent, mild: Secondary | ICD-10-CM

## 2020-01-27 DIAGNOSIS — F5101 Primary insomnia: Secondary | ICD-10-CM

## 2020-01-28 NOTE — Telephone Encounter (Signed)
Tried to call into pharmacy but number was unavailable this morning. Next apt 09/08

## 2020-01-29 ENCOUNTER — Telehealth (INDEPENDENT_AMBULATORY_CARE_PROVIDER_SITE_OTHER): Payer: Self-pay | Admitting: Internal Medicine

## 2020-01-29 NOTE — Telephone Encounter (Signed)
Patient left voice mail message stating the medication is not working - she is still having left side pain - please advise - ph# [phone]

## 2020-01-29 NOTE — Telephone Encounter (Signed)
Talked with the patient. She stated that the Hyoscyamine 0.125 taking three times a day is not  working. She continues to have side pain, On a scale of 1-10 she reports that the pain is at a 5 or 6. The pain is pretty much all the time , except when she is laying down.  He nausea has returned and yesterday she had loose stool x 3.  Her last dose of Hyoscyamine was 01/28/2020 @ 12 am.  To address with Dr.Rehman.

## 2020-01-30 ENCOUNTER — Other Ambulatory Visit (INDEPENDENT_AMBULATORY_CARE_PROVIDER_SITE_OTHER): Payer: Self-pay | Admitting: Internal Medicine

## 2020-01-30 MED ORDER — TRAMADOL HCL 50 MG PO TABS: 50.0000 mg | ORAL_TABLET | Freq: Two times a day (BID) | ORAL | 0 refills | Status: DC | PRN

## 2020-01-30 NOTE — Telephone Encounter (Signed)
Talked with Dr.Rehman - he states that he would like to precede with EGD/TCS with MAC. He will also call in Tramadol for the patient when he comes to the office.  Patient was called and made aware , she was advised that the scheduler would be calling her and this may be the next week.

## 2020-01-30 NOTE — Progress Notes (Signed)
Prescription for tramadol 50 mg p.o. twice daily as needed sent to patient's pharmacy 20 doses without refill.  Please note patient's overdose risk score is 117. Patient is allergic to 16 different medications but not to tramadol.  She experienced nausea and vomiting with hydrocodone.

## 2020-02-02 ENCOUNTER — Encounter (INDEPENDENT_AMBULATORY_CARE_PROVIDER_SITE_OTHER): Payer: Self-pay | Admitting: *Deleted

## 2020-02-02 ENCOUNTER — Other Ambulatory Visit (INDEPENDENT_AMBULATORY_CARE_PROVIDER_SITE_OTHER): Payer: Self-pay | Admitting: *Deleted

## 2020-02-02 DIAGNOSIS — R1013 Epigastric pain: Secondary | ICD-10-CM

## 2020-02-02 DIAGNOSIS — R197 Diarrhea, unspecified: Secondary | ICD-10-CM

## 2020-02-02 DIAGNOSIS — K219 Gastro-esophageal reflux disease without esophagitis: Secondary | ICD-10-CM

## 2020-02-02 DIAGNOSIS — R110 Nausea: Secondary | ICD-10-CM

## 2020-02-02 MED ORDER — PLENVU 140 G PO SOLR: 1.0000 | Freq: Once | ORAL | 0 refills | Status: AC

## 2020-02-02 NOTE — Telephone Encounter (Signed)
Patient needs Plenvu (copay card) ° °

## 2020-02-02 NOTE — Telephone Encounter (Signed)
TCS/EGD sch'd 03/24/20

## 2020-02-11 ENCOUNTER — Other Ambulatory Visit (HOSPITAL_COMMUNITY): Payer: BC Managed Care – PPO

## 2020-02-11 ENCOUNTER — Ambulatory Visit: Payer: BC Managed Care – PPO | Admitting: Psychiatry

## 2020-02-16 ENCOUNTER — Telehealth: Payer: Self-pay | Admitting: Adult Health

## 2020-02-16 ENCOUNTER — Telehealth: Payer: Self-pay | Admitting: Psychiatry

## 2020-02-16 DIAGNOSIS — F4321 Adjustment disorder with depressed mood: Secondary | ICD-10-CM

## 2020-02-16 NOTE — Telephone Encounter (Signed)
Please review

## 2020-02-16 NOTE — Telephone Encounter (Signed)
I called pt and she is asking for amerge (like she had before when she had cluster of headaches).   This has helped her in the past.  She cannot come in for infusion due to living in [place], cannot tolerate perdnisone.  Her brother pasted Saturday due to covid, and is going out of town Wednesday.  She is trying to get relief prior to that.  Please advise.  Dr. Lucia Gaskins ordered for her previously  amerge 2.5mg  po BID for 5 day.  10 tabs to see if will brreak cycle. (from 09-2019)

## 2020-02-16 NOTE — Telephone Encounter (Signed)
Pt is requesting something for anxiety due to the death of her brother. She is leaving for [place] on Wed for the service. She stated she only need a few to get her through this difficult time. She uses the [facility] in [place].

## 2020-02-16 NOTE — Telephone Encounter (Signed)
Pt called stating she is needing the medication that is called in for her when she is needing to "break the cycle of a headache". Pt could not remember what the name was. Pt states she has had a headache for a week. Please advise.

## 2020-02-17 MED ORDER — NARATRIPTAN HCL 2.5 MG PO TABS: ORAL_TABLET | ORAL | 0 refills | Status: DC

## 2020-02-17 MED ORDER — LORAZEPAM 0.5 MG PO TABS: ORAL_TABLET | ORAL | 0 refills | Status: DC

## 2020-02-17 NOTE — Telephone Encounter (Signed)
Pt called wanting to know the update on this medication. Please advise.

## 2020-02-17 NOTE — Telephone Encounter (Signed)
Spoke with MeganNP approved amerge 2.5mg  po bid for 5 days #10.  Pt has had headache for a week, nothing helping.  Pt notified. She appreciated this.

## 2020-02-19 ENCOUNTER — Encounter: Payer: Self-pay | Admitting: Psychiatry

## 2020-02-19 ENCOUNTER — Ambulatory Visit (INDEPENDENT_AMBULATORY_CARE_PROVIDER_SITE_OTHER): Payer: BC Managed Care – PPO | Admitting: Psychiatry

## 2020-02-19 ENCOUNTER — Other Ambulatory Visit: Payer: Self-pay

## 2020-02-19 ENCOUNTER — Telehealth: Payer: Self-pay | Admitting: Adult Health

## 2020-02-19 DIAGNOSIS — F331 Major depressive disorder, recurrent, moderate: Secondary | ICD-10-CM

## 2020-02-19 DIAGNOSIS — F5101 Primary insomnia: Secondary | ICD-10-CM | POA: Diagnosis not present

## 2020-02-19 DIAGNOSIS — F419 Anxiety disorder, unspecified: Secondary | ICD-10-CM

## 2020-02-19 MED ORDER — ESZOPICLONE 3 MG PO TABS: 3.0000 mg | ORAL_TABLET | Freq: Every day | ORAL | 2 refills | Status: DC

## 2020-02-19 MED ORDER — BUPROPION HCL ER (XL) 150 MG PO TB24: 150.0000 mg | ORAL_TABLET | Freq: Every day | ORAL | 1 refills | Status: DC

## 2020-02-19 NOTE — Progress Notes (Signed)
MERISA JULIO [id number] [birth year] 64 y.o.  Subjective:   Patient ID:  Sabrina Duke is a 64 y.o. (DOB January 31, 1956) female.  Chief Complaint:  Chief Complaint  Patient presents with   Depression   Anxiety   Other    Irritability    HPI Sabrina Duke presents to the office today for follow-up of depression, anxiety, and insomnia. Brother died from COVID 2022/10/14 and she was supposed to fly out yesterday fo rthe funeral and then another family was dx'd with COVID. Mother also just had COVID and is recovering from COVID and has bone cancer.   She reports that she tried re-starting Prozac and it caused her to have a headache. She reports that prior to the loss of her brother she had lost desire to do things. She reports that Wellbutrin helps to get her up and get her awake "but I don't have any desire to do anything once I get up." Motivation is low. Energy is also low. She reports that she is not interested in cooking or eating and has to push herself to [place] for her husband. Appetite is low. She reports difficulty getting her thoughts together and feels "scatterbrained." Reports that she has been going to bed around 1 pm and sleeping until about 9:30-10 am. She reports that "little stuff agitated me." She reports some anxiety and that it manifests as agitation and irritability. Denies panic attacks. Denies SI.    Had to lower Wellbutrin SR to 100 mg po qd from BID due to headaches and depressive s/s have worsened with lower dose. She reports that Wellbutrin seems to help with her irritability.    Past medication trials: Wellbutrin- Effective, well tolerated Trintellix-GI side effects Pristiq- somewhat helpful Lexapro Celexa Prozac- Helped, caused headaches Cymbalta Rexulti Vraylar-jittery, hallucinations Latuda-joint aches Abilify-weight gain Lamictal BuSpar- Weight gain Topamax Trazodone-excessive somnolence Lunesta Ambien-ineffective Adderall XR Ativan-Effective   Review  of Systems:  Review of Systems  Constitutional: Positive for appetite change.  Gastrointestinal: Positive for constipation and diarrhea.  Musculoskeletal: Negative for gait problem.  Neurological: Positive for headaches. Negative for tremors.  Psychiatric/Behavioral:       Please refer to HPI    Medications: I have reviewed the patient's current medications.  Current Outpatient Medications  Medication Sig Dispense Refill   [START ON 02/25/2020] Eszopiclone 3 MG TABS Take 1 tablet (3 mg total) by mouth at bedtime. Take immediately before bedtime 30 tablet 2   levothyroxine (SYNTHROID) 50 MCG tablet Take 50 mcg by mouth daily before breakfast.     LORazepam (ATIVAN) 0.5 MG tablet Take 1/2-1 tab po q 6 hours prn anxiety. (Do not take with Lunesta) 10 tablet 0   Magnesium 400 MG TABS Take 400 mg by mouth at bedtime.     naratriptan (AMERGE) 2.5 MG tablet Take one tablet in AM and at bedtime for 5 days. 10 tablet 0   Probiotic Product (PROBIOTIC PO) Take by mouth.     SUMAtriptan (IMITREX) 100 MG tablet Take 1 tablet (100 mg total) by mouth once as needed for up to 1 dose for migraine. May repeat in 2 hours if headache persists or recurs. 10 tablet 2   topiramate (TOPAMAX) 50 MG tablet Take 3 tablets (150 mg total) by mouth at bedtime. 270 tablet 3   Vitamin D, Ergocalciferol, (DRISDOL) 1.25 MG (50000 UNIT) CAPS capsule Take 50,000 Units by mouth once a week.     buPROPion (WELLBUTRIN XL) 150 MG 24 hr tablet Take 1 tablet (  150 mg total) by mouth daily. 30 tablet 1   hyoscyamine (LEVSIN SL) 0.125 MG SL tablet Place 1 tablet (0.125 mg total) under the tongue 3 (three) times daily as needed (abdominal pain). (Patient not taking: Reported on 02/19/2020) 30 tablet 0   promethazine (PHENERGAN) 25 MG tablet Take 1 tablet (25 mg total) by mouth every 6 (six) hours as needed for up to 10 days for nausea or vomiting. 30 tablet 0   sucralfate (CARAFATE) 1 g tablet Take 1 tablet (1 g total) by  mouth 4 (four) times daily as needed. (Patient not taking: Reported on 02/19/2020) 120 tablet 5   traMADol (ULTRAM) 50 MG tablet Take 1 tablet (50 mg total) by mouth every 12 (twelve) hours as needed. (Patient not taking: Reported on 02/19/2020) 20 tablet 0   No current facility-administered medications for this visit.    Medication Side Effects: None  Allergies:  Allergies  Allergen Reactions   Ivp Dye [Iodinated Diagnostic Agents] Anaphylaxis   Antihistamines, Loratadine-Type Other (See Comments)    Sleepy   Ciprofloxacin Nausea And Vomiting   Dymista [Azelastine-Fluticasone] Other (See Comments)    Heachache   Lortab [Hydrocodone-Acetaminophen] Nausea And Vomiting   Macrobid [Nitrofurantoin] Other (See Comments)    Unknown   Montelukast Other (See Comments)    Sleep   Nsaids     Pt can tolerate small amounts of Aleve "mess my stomach up, have had ulcer in the past"   Nurtec [Rimegepant Sulfate] Nausea Only   Other     Patient not sure about these allergies  Darvocet-   Mermantin- "makes me sick on my stomach"  Bio Citrate Calcium with Vitamin D- "hurt stomach"      Pepcid [Famotidine] Other (See Comments)    Headache   Prednisone     "makes me hyper and gives me a headache"   Prilosec [Omeprazole] Other (See Comments)    Headache   Rosuvastatin Calcium Other (See Comments)    Headaches.   Vantin [Cefpodoxime] Nausea And Vomiting   Zofran [Ondansetron] Other (See Comments)    Causes headaches per pt    Past Medical History:  Diagnosis Date   Allergic rhinitis    Decreased bone density    Depression    GERD (gastroesophageal reflux disease)    Hypothyroid    Iron deficiency anemia    Low vitamin D level    Migraine     Family History  Problem Relation Age of Onset   Anxiety disorder Mother    Depression Mother    Bone cancer Mother    OCD Mother    Cerebral aneurysm Maternal Uncle    Drug abuse Brother    Depression  Brother     Social History   Socioeconomic History   Marital status: Married    Spouse name: Not on file   Number of children: 1   Years of education: Not on file   Highest education level: High school graduate  Occupational History   Occupation: disabled  Tobacco Use   Smoking status: Former Smoker   Smokeless tobacco: Never Used  Building services engineer Use: Never used  Substance and Sexual Activity   Alcohol use: No   Drug use: No   Sexual activity: Not on file  Other Topics Concern   Not on file  Social History Narrative   Lives at home with husband   Right handed   No caffeine   Social Determinants of Corporate investment banker  Strain:    Difficulty of Paying Living Expenses: Not on file  Food Insecurity:    Worried About Running Out of Food in the Last Year: Not on file   Ran Out of Food in the Last Year: Not on file  Transportation Needs:    Lack of Transportation (Medical): Not on file   Lack of Transportation (Non-Medical): Not on file  Physical Activity:    Days of Exercise per Week: Not on file   Minutes of Exercise per Session: Not on file  Stress:    Feeling of Stress : Not on file  Social Connections:    Frequency of Communication with Friends and Family: Not on file   Frequency of Social Gatherings with Friends and Family: Not on file   Attends Religious Services: Not on file   Active Member of Clubs or Organizations: Not on file   Attends Banker Meetings: Not on file   Marital Status: Not on file  Intimate Partner Violence:    Fear of Current or Ex-Partner: Not on file   Emotionally Abused: Not on file   Physically Abused: Not on file   Sexually Abused: Not on file    Past Medical History, Surgical history, Social history, and Family history were reviewed and updated as appropriate.   Please see review of systems for further details on the patient's review from today.   Objective:   Physical Exam:   There were no vitals taken for this visit.  Physical Exam Constitutional:      General: She is not in acute distress. Musculoskeletal:        General: No deformity.  Neurological:     Mental Status: She is alert and oriented to person, place, and time.     Coordination: Coordination normal.  Psychiatric:        Attention and Perception: Attention and perception normal. She does not perceive auditory or visual hallucinations.        Mood and Affect: Mood is anxious and depressed. Affect is not labile, blunt, angry or inappropriate.        Speech: Speech normal.        Behavior: Behavior normal.        Thought Content: Thought content normal. Thought content is not paranoid or delusional. Thought content does not include homicidal or suicidal ideation. Thought content does not include homicidal or suicidal plan.        Cognition and Memory: Cognition and memory normal.        Judgment: Judgment normal.     Comments: Insight intact     Lab Review:     Component Value Date/Time   NA 140 01/20/2020 0354   K 3.8 01/20/2020 0354   CL 113 (H) 01/20/2020 0354   CO2 19 (L) 01/20/2020 0354   GLUCOSE 104 (H) 01/20/2020 0354   BUN 14 01/20/2020 0354   CREATININE 1.03 (H) 01/20/2020 0354   CALCIUM 8.4 (L) 01/20/2020 0354   PROT 6.6 01/16/2020 1418   ALBUMIN 4.1 01/16/2020 1418   AST 19 01/16/2020 1418   ALT 16 01/16/2020 1418   ALKPHOS 69 01/16/2020 1418   BILITOT 0.4 01/16/2020 1418   GFRNONAA 58 (L) 01/20/2020 0354   GFRAA >60 01/20/2020 0354       Component Value Date/Time   WBC 4.0 01/20/2020 0354   RBC 4.08 01/20/2020 0354   HGB 12.1 01/20/2020 0354   HCT 37.3 01/20/2020 0354   PLT 105 (L) 01/20/2020 0354   MCV  91.4 01/20/2020 0354   MCH 29.7 01/20/2020 0354   MCHC 32.4 01/20/2020 0354   RDW 12.4 01/20/2020 0354   LYMPHSABS 0.9 01/20/2020 0354   MONOABS 0.3 01/20/2020 0354   EOSABS 0.1 01/20/2020 0354   BASOSABS 0.0 01/20/2020 0354    No results found for:  POCLITH, LITHIUM   No results found for: PHENYTOIN, PHENOBARB, VALPROATE, CBMZ   .res Assessment: Plan:   Will change Wellbutrin SR 100 mg daily to Wellbutrin XL 150 mg daily since patient reports worsening depression with decrease in Wellbutrin SR.  Discussed that taking higher amount of Wellbutrin may be helpful for her depression and that Wellbutrin XL 150 may be a low enough dose that it does not cause worsening headaches.  Discussed considering adding Wellbutrin SR 100 mg in the mid afternoon if she requires further augmentation. Continue Lunesta 3 mg at bedtime for insomnia. Continue Ativan as needed for acute anxiety. Patient to follow-up in 1 month or sooner if clinically indicated. Patient advised to contact office with any questions, adverse effects, or acute worsening in signs and symptoms.  Cordelia PenSherry was seen today for depression, anxiety and other.  Diagnoses and all orders for this visit:  Moderate episode of recurrent major depressive disorder (HCC) -     buPROPion (WELLBUTRIN XL) 150 MG 24 hr tablet; Take 1 tablet (150 mg total) by mouth daily.  Primary insomnia -     Eszopiclone 3 MG TABS; Take 1 tablet (3 mg total) by mouth at bedtime. Take immediately before bedtime  Anxiety     Please see After Visit Summary for patient specific instructions.  Future Appointments  Date Time Provider Department Center  03/10/2020  2:30 PM Shawnie DapperLomax, Amy, NP [facility] None  03/18/2020  1:30 PM Corie Chiquitoarter, Cayleigh Paull, PMHNP [facility] None  03/22/2020  1:00 PM [facility] PAT 2 [facility] None  03/22/2020  1:15 PM [facility] PAT 2 [facility] None  07/29/2020 11:15 AM Levonne HubertWoodard, Janice A, PA-C [facility] None    No orders of the defined types were placed in this encounter.   -------------------------------

## 2020-02-19 NOTE — Telephone Encounter (Signed)
Received a PA request for naratriptan 2.5mg . PA was started on [email or web address]. Key is [id number]. Per [email or web address], a determination will be made within the next 72 hours. Will continue to monitor for updates.

## 2020-02-23 ENCOUNTER — Ambulatory Visit: Payer: BC Managed Care – PPO | Admitting: Psychiatry

## 2020-03-02 ENCOUNTER — Other Ambulatory Visit: Payer: Self-pay | Admitting: Psychiatry

## 2020-03-02 NOTE — Telephone Encounter (Signed)
DENIED. Per the health plan's criteria, more than 9 tablets per month is covered if you meet the following: (1) You have one of the following: (A) A type of sudden headache condition (acute migraines with or without aura). (B) A type of headache where the pain is on one side (cluster headaches). (2) The drug is being prescribed by a doctor who specializes in a doctor who specializes in brain and nerve disorders (neurologist) or pain conditions (pain management specialist). (3) You have 2 or more headaches a month. (4) You will not be treating 15 or more headache days a month. (5) The drug will not be used with another drug of the same class (triptan) or a drug that has ergotamine. The information provided does not show that you meet the criteria listed above. Please speak with your doctor about your choices. Reviewed by: Jonelle Sidle Case number: [id number]

## 2020-03-03 ENCOUNTER — Ambulatory Visit: Payer: BC Managed Care – PPO | Admitting: Psychiatry

## 2020-03-08 ENCOUNTER — Other Ambulatory Visit: Payer: Self-pay

## 2020-03-08 ENCOUNTER — Emergency Department (HOSPITAL_COMMUNITY): Payer: BC Managed Care – PPO

## 2020-03-08 ENCOUNTER — Telehealth (INDEPENDENT_AMBULATORY_CARE_PROVIDER_SITE_OTHER): Payer: Self-pay | Admitting: Gastroenterology

## 2020-03-08 ENCOUNTER — Emergency Department (HOSPITAL_COMMUNITY)
Admission: EM | Admit: 2020-03-08 | Discharge: 2020-03-09 | Disposition: A | Discharge status: Home / Self Care | Payer: BC Managed Care – PPO | Attending: Emergency Medicine | Admitting: Emergency Medicine

## 2020-03-08 DIAGNOSIS — R1012 Left upper quadrant pain: Secondary | ICD-10-CM | POA: Diagnosis not present

## 2020-03-08 DIAGNOSIS — R112 Nausea with vomiting, unspecified: Secondary | ICD-10-CM | POA: Diagnosis not present

## 2020-03-08 DIAGNOSIS — Z79899 Other long term (current) drug therapy: Secondary | ICD-10-CM | POA: Diagnosis not present

## 2020-03-08 DIAGNOSIS — K5900 Constipation, unspecified: Secondary | ICD-10-CM | POA: Diagnosis not present

## 2020-03-08 DIAGNOSIS — Z87891 Personal history of nicotine dependence: Secondary | ICD-10-CM | POA: Insufficient documentation

## 2020-03-08 DIAGNOSIS — E039 Hypothyroidism, unspecified: Secondary | ICD-10-CM | POA: Diagnosis not present

## 2020-03-08 DIAGNOSIS — I700 Atherosclerosis of aorta: Secondary | ICD-10-CM | POA: Diagnosis not present

## 2020-03-08 DIAGNOSIS — R109 Unspecified abdominal pain: Secondary | ICD-10-CM | POA: Diagnosis not present

## 2020-03-08 LAB — URINALYSIS, ROUTINE W REFLEX MICROSCOPIC
Bacteria, UA: NONE SEEN
Bilirubin Urine: NEGATIVE
Glucose, UA: NEGATIVE mg/dL
Hgb urine dipstick: NEGATIVE
Ketones, ur: NEGATIVE mg/dL
Nitrite: NEGATIVE
Protein, ur: NEGATIVE mg/dL
Specific Gravity, Urine: 1.008 (ref 1.005–1.030)
pH: 6 (ref 5.0–8.0)

## 2020-03-08 LAB — COMPREHENSIVE METABOLIC PANEL
ALT: 14 U/L (ref 0–44)
AST: 20 U/L (ref 15–41)
Albumin: 4.3 g/dL (ref 3.5–5.0)
Alkaline Phosphatase: 78 U/L (ref 38–126)
Anion gap: 8 (ref 5–15)
BUN: 17 mg/dL (ref 8–23)
CO2: 22 mmol/L (ref 22–32)
Calcium: 9.3 mg/dL (ref 8.9–10.3)
Chloride: 109 mmol/L (ref 98–111)
Creatinine, Ser: 0.96 mg/dL (ref 0.44–1.00)
GFR calc Af Amer: >60 mL/min (ref >60)
GFR calc non Af Amer: >60 mL/min (ref >60)
Glucose, Bld: 97 mg/dL (ref 70–99)
Potassium: 4 mmol/L (ref 3.5–5.1)
Sodium: 139 mmol/L (ref 135–145)
Total Bilirubin: 0.5 mg/dL (ref 0.3–1.2)
Total Protein: 6.7 g/dL (ref 6.5–8.1)

## 2020-03-08 LAB — CBC
HCT: 43.5 % (ref 36.0–46.0)
Hemoglobin: 14.6 g/dL (ref 12.0–15.0)
MCH: 29.7 pg (ref 26.0–34.0)
MCHC: 33.6 g/dL (ref 30.0–36.0)
MCV: 88.4 fL (ref 80.0–100.0)
Platelets: 141 10*3/uL — ABNORMAL LOW (ref 150–400)
RBC: 4.92 MIL/uL (ref 3.87–5.11)
RDW: 12.5 % (ref 11.5–15.5)
WBC: 3.8 10*3/uL — ABNORMAL LOW (ref 4.0–10.5)
nRBC: 0 % (ref 0.0–0.2)

## 2020-03-08 LAB — LIPASE, BLOOD: Lipase: 29 U/L (ref 11–51)

## 2020-03-08 NOTE — Telephone Encounter (Signed)
I have noted this.  

## 2020-03-08 NOTE — Telephone Encounter (Signed)
patient left voice mail message stating she has not had a bowel movement in several days - stated she has taken several laxatives with no relief - please advise - ph# [phone]

## 2020-03-08 NOTE — ED Triage Notes (Signed)
Pt c/o left side abdominal pain that started today; pt was admitted last month for an ileus; pt called Dr. Patty Sermons office and was instructed if she started vomiting to come to ED

## 2020-03-08 NOTE — Telephone Encounter (Signed)
Sabrina Duke took 3 ducolax stool softners Saturday, she has been eating applesause eating apples cereal with fiber, she is stating that she is nauseated and has not had a bowel movement in 5 days and hurting in her left side again, she states that she is thinking about going to the ER, please advise?

## 2020-03-08 NOTE — Telephone Encounter (Signed)
I discussed patient's symptoms with her. Ongoing symptoms x few weeks but worse over past 3 days. Symptoms seem similar to when in hospital in August 2021 but much less severe, pain worst in LLQ. Currently rates pain 6 out of 10, states in August her pain was 10 out of 10.  She has nausea but no vomiting. Feels some abd pressure and distention. Took 3 docusate Sat - no BM since then. Ate oatmeal this morning for breakfast. No longer taking carafate, tramadol or Phenergan.  She has a Zofran allergy.    We discussed going to ER but she feels symptoms are tolerable.  I recommended she do clear liquids this evening and try some MiraLAX to help bowels move.  She will call tomorrow with an update.  If symptoms worsen overnight to ER.  Her August 2021 CTs were reviewed.    Tammy - she is going to call w/ symptom update tomorrow FYI.

## 2020-03-08 NOTE — Telephone Encounter (Signed)
Sabrina Duke - can you call patient and find out what she has tried OTC for constipation and when last good BM was. Thanks.

## 2020-03-08 NOTE — Telephone Encounter (Signed)
Routing to Janice for advice? 

## 2020-03-08 NOTE — ED Provider Notes (Signed)
[facility] EMERGENCY DEPARTMENT Provider Note   CSN: [id number] Arrival date & time: 03/08/20  2046     History Chief Complaint  Patient presents with  . Abdominal Pain    Sabrina Duke is a 64 y.o. female.  HPI     This is a 64 year old female with a history of reflux, IBS, recent ileus who presents with abdominal discomfort and constipation.  Patient reports that she has not had a bowel movement in several days.  She has developed some left-sided abdominal pain that is nonradiating.  This is characteristic of the pain that she had when she had her ileus.  She attempted to take laxative MiraLAX with minimal relief.  She states her pain is better when she lays flat.  Currently her pain is 8 out of 10.  She called Dr. Patty Sermons office and was told to come to the ED if she developed vomiting.  She attempted to take MiraLAX and started to vomit.  She reports nonbilious, nonbloody emesis.  No recent fevers, cough, chest pain, shortness of breath.  No urinary symptoms or back pain.  Past Medical History:  Diagnosis Date  . Allergic rhinitis   . Decreased bone density   . Depression   . GERD (gastroesophageal reflux disease)   . Hypothyroid   . Iron deficiency anemia   . Low vitamin D level   . Migraine     Patient Active Problem List   Diagnosis Date Noted  . History of IBS 01/26/2020  . Ileus (HCC) 01/16/2020  . GERD (gastroesophageal reflux disease)   . Hypothyroid   . Abdominal pain, epigastric 02/03/2019  . Nausea without vomiting 02/03/2019  . Acid reflux 02/03/2019  . Anxiety state 04/09/2018  . Migraine, unspecified, intractable, with status migrainosus 04/09/2018  . Insomnia 04/09/2018  . Chronic migraine without aura without status migrainosus, not intractable 07/02/2017    Past Surgical History:  Procedure Laterality Date  . BIOPSY  03/10/2019   Procedure: BIOPSY;  Surgeon: Malissa Hippo, MD;  Location: [facility];  Service: Endoscopy;;  gastric   .  ESOPHAGOGASTRODUODENOSCOPY N/A 03/10/2019   Procedure: ESOPHAGOGASTRODUODENOSCOPY (EGD);  Surgeon: Malissa Hippo, MD;  Location: [facility];  Service: Endoscopy;  Laterality: N/A;  2:40-rescheduled 10/5 @11 :55am per office  . hemroidectomy  1990  . OVARIAN CYST SURGERY  1972  . SHOULDER SURGERY Left 11/2017  . TUBAL LIGATION  1997  . uterine polyp removal  05/2015     OB History   No obstetric history on file.     Family History  Problem Relation Age of Onset  . Anxiety disorder Mother   . Depression Mother   . Bone cancer Mother   . OCD Mother   . Cerebral aneurysm Maternal Uncle   . Drug abuse Brother   . Depression Brother     Social History   Tobacco Use  . Smoking status: Former 06/2015  . Smokeless tobacco: Never Used  Vaping Use  . Vaping Use: Never used  Substance Use Topics  . Alcohol use: No  . Drug use: No    Home Medications Prior to Admission medications   Medication Sig Start Date End Date Taking? Authorizing Provider  buPROPion (WELLBUTRIN XL) 150 MG 24 hr tablet Take 1 tablet (150 mg total) by mouth daily. 02/19/20   02/21/20, PMHNP  Eszopiclone 3 MG TABS Take 1 tablet (3 mg total) by mouth at bedtime. Take immediately before bedtime 02/25/20   02/27/20, PMHNP  hyoscyamine (LEVSIN SL) 0.125 MG SL tablet Place 1 tablet (0.125 mg total) under the tongue 3 (three) times daily as needed (abdominal pain). Patient not taking: Reported on 02/19/2020 01/20/20   Maurilio Lovely D, DO  levothyroxine (SYNTHROID) 50 MCG tablet Take 50 mcg by mouth daily before breakfast. 01/13/19   [provider]  LORazepam (ATIVAN) 0.5 MG tablet Take 1/2-1 tab po q 6 hours prn anxiety. (Do not take with Lunesta) 02/17/20   Corie Chiquito, PMHNP  Magnesium 400 MG TABS Take 400 mg by mouth at bedtime.    [provider]  naratriptan (AMERGE) 2.5 MG tablet Take one tablet in AM and at bedtime for 5 days. 02/17/20   Butch Penny, NP  Probiotic Product  (PROBIOTIC PO) Take by mouth.    [provider]  promethazine (PHENERGAN) 25 MG tablet Take 1 tablet (25 mg total) by mouth every 6 (six) hours as needed for up to 10 days for nausea or vomiting. 01/23/20 02/02/20  Dolores Frame, MD  sucralfate (CARAFATE) 1 g tablet Take 1 tablet (1 g total) by mouth 4 (four) times daily as needed. Patient not taking: Reported on 02/19/2020 01/26/20   Malissa Hippo, MD  SUMAtriptan (IMITREX) 100 MG tablet Take 1 tablet (100 mg total) by mouth once as needed for up to 1 dose for migraine. May repeat in 2 hours if headache persists or recurs. 12/03/19   Lomax, Amy, NP  topiramate (TOPAMAX) 50 MG tablet Take 3 tablets (150 mg total) by mouth at bedtime. 11/12/18   Lomax, Amy, NP  traMADol (ULTRAM) 50 MG tablet Take 1 tablet (50 mg total) by mouth every 12 (twelve) hours as needed. Patient not taking: Reported on 02/19/2020 01/30/20   Malissa Hippo, MD  Vitamin D, Ergocalciferol, (DRISDOL) 1.25 MG (50000 UNIT) CAPS capsule Take 50,000 Units by mouth once a week. 12/24/19   [provider]    Allergies    Ivp dye [iodinated diagnostic agents]; Antihistamines, loratadine-type; Ciprofloxacin; Dymista [azelastine-fluticasone]; Lortab [hydrocodone-acetaminophen]; Macrobid [nitrofurantoin]; Montelukast; Nsaids; Nurtec [rimegepant sulfate]; Other; Pepcid [famotidine]; Prednisone; Prilosec [omeprazole]; Rosuvastatin calcium; Vantin [cefpodoxime]; and Zofran [ondansetron]  Review of Systems   Review of Systems  Constitutional: Negative for fever.  Respiratory: Negative for shortness of breath.   Cardiovascular: Negative for chest pain.  Gastrointestinal: Positive for abdominal pain, constipation, nausea and vomiting. Negative for blood in stool and diarrhea.  Genitourinary: Negative for dysuria.  All other systems reviewed and are negative.   Physical Exam Updated Vital Signs BP 127/86   Pulse 74   Temp 98.1 F (36.7 C)   Resp 18   Ht  1.626 m (5\' 4" )   Wt 63.5 kg   SpO2 100%   BMI 24.03 kg/m   Physical Exam Vitals and nursing note reviewed.  Constitutional:      Appearance: She is well-developed. She is not ill-appearing.  HENT:     Head: Normocephalic and atraumatic.     Mouth/Throat:     Mouth: Mucous membranes are moist.  Eyes:     Pupils: Pupils are equal, round, and reactive to light.  Cardiovascular:     Rate and Rhythm: Normal rate and regular rhythm.     Heart sounds: Normal heart sounds.  Pulmonary:     Effort: Pulmonary effort is normal. No respiratory distress.     Breath sounds: No wheezing.  Abdominal:     General: Bowel sounds are normal.     Palpations: Abdomen is soft.  Tenderness: There is abdominal tenderness in the left lower quadrant.     Comments: Voluntary guarding  Musculoskeletal:     Cervical back: Neck supple.  Skin:    General: Skin is warm and dry.  Neurological:     Mental Status: She is alert and oriented to person, place, and time.  Psychiatric:        Mood and Affect: Mood normal.     ED Results / Procedures / Treatments   Labs (all labs ordered are listed, but only abnormal results are displayed) Labs Reviewed  CBC - Abnormal; Notable for the following components:      Result Value   WBC 3.8 (*)    Platelets 141 (*)    All other components within normal limits  URINALYSIS, ROUTINE W REFLEX MICROSCOPIC - Abnormal; Notable for the following components:   Color, Urine STRAW (*)    Leukocytes,Ua TRACE (*)    All other components within normal limits  LIPASE, BLOOD  COMPREHENSIVE METABOLIC PANEL    EKG None  Radiology CT ABDOMEN PELVIS WO CONTRAST  Result Date: 03/09/2020 CLINICAL DATA:  Abdominal pain, left-sided EXAM: CT ABDOMEN AND PELVIS WITHOUT CONTRAST TECHNIQUE: Multidetector CT imaging of the abdomen and pelvis was performed following the standard protocol without IV contrast. COMPARISON:  January 19, 2020 FINDINGS: Lower chest: The visualized heart  size within normal limits. No pericardial fluid/thickening. No hiatal hernia. There is a tiny 4 mm nodule in the posterior left lung base. Hepatobiliary: Although limited due to the lack of intravenous contrast, normal in appearance without gross focal abnormality. No evidence of calcified gallstones or biliary ductal dilatation. Pancreas:  Unremarkable.  No surrounding inflammatory changes. Spleen: Normal in size. Although limited due to the lack of intravenous contrast, normal in appearance. Adrenals/Urinary Tract: Both adrenal glands appear normal. The kidneys and collecting system appear normal without evidence of urinary tract calculus or hydronephrosis. Bladder is unremarkable. Stomach/Bowel: The stomach is unremarkable. There is question of mild wall thickening seen within the second and third portion of the duodenum. The remainder of the small bowel is unremarkable. A moderate amount of colonic stool is present. Vascular/Lymphatic: There are no enlarged abdominal or pelvic lymph nodes. Scattered mild aortic atherosclerotic calcifications are seen without aneurysmal dilatation. Reproductive: The uterus and adnexa are unremarkable. Other: No evidence of abdominal wall mass or hernia. Musculoskeletal: No acute or significant osseous findings. Chronic slight superior compression deformity of the T12 vertebral body is seen. IMPRESSION: Possible mild wall thickening at the second and third portion of the duodenum which could be due to mild duodenitis. Moderate amount of colonic stool without evidence of obstruction. Aortic Atherosclerosis (ICD10-I70.0). Electronically Signed   By: Jonna ClarkBindu  Avutu M.D.   On: 03/09/2020 02:07    Procedures Procedures (including critical care time)  Medications Ordered in ED Medications  promethazine (PHENERGAN) injection 12.5 mg (12.5 mg Intravenous Given 03/09/20 0016)  dicyclomine (BENTYL) capsule 10 mg (10 mg Oral Given 03/09/20 0016)    ED Course  I have reviewed the  triage vital signs and the nursing notes.  Pertinent labs & imaging results that were available during my care of the patient were reviewed by me and considered in my medical decision making (see chart for details).    MDM Rules/Calculators/A&P                           Patient presents with abdominal pain.  She is overall nontoxic and vital  signs are reassuring.  Recent admission for nausea and vomiting with an ileus.  She has some pain consistent with her viral ileus.  Additionally, she reports vomiting this evening.  Considerations include but not limited to, ileus, obstruction, constipation, gastritis.  Is reassuring.  No significant leukocytosis or metabolic derangement.  Patient reported ongoing symptoms overlying CT scan was repeated and shows no evidence of ileus or bowel obstruction.  She does have some possible duodenitis but this would not be consistent with her current symptoms.  She also has stool burden.  Feel her symptoms are likely related to ongoing constipation.  Recommend increasing MiraLAX to 3 times daily and follow-up closely with gastroenterology.  Patient is able to orally hydrate without vomiting prior to discharge.  After history, exam, and medical workup I feel the patient has been appropriately medically screened and is safe for discharge home. Pertinent diagnoses were discussed with the patient. Patient was given return precautions.   Final Clinical Impression(s) / ED Diagnoses Final diagnoses:  Left upper quadrant abdominal pain  Constipation, unspecified constipation type    Rx / DC Orders ED Discharge Orders    None       Wilkie Aye, Mayer Masker, MD 03/09/20 [phone]

## 2020-03-08 NOTE — Telephone Encounter (Signed)
Left a voicemail to return my call.

## 2020-03-09 DIAGNOSIS — R109 Unspecified abdominal pain: Secondary | ICD-10-CM | POA: Diagnosis not present

## 2020-03-09 DIAGNOSIS — I700 Atherosclerosis of aorta: Secondary | ICD-10-CM | POA: Diagnosis not present

## 2020-03-09 MED ORDER — PROMETHAZINE HCL 25 MG/ML IJ SOLN
12.5000 mg | Freq: Once | INTRAMUSCULAR | Status: AC
  Administered 2020-03-09: 12.5 mg via INTRAVENOUS
  Filled 2020-03-09: qty 1

## 2020-03-09 MED ORDER — DICYCLOMINE HCL 10 MG PO CAPS
10.0000 mg | ORAL_CAPSULE | Freq: Once | ORAL | Status: AC
  Administered 2020-03-09: 10 mg via ORAL
  Filled 2020-03-09: qty 1

## 2020-03-09 NOTE — Discharge Instructions (Addendum)
You were seen today for abdominal pain and constipation.  Your CT scan is largely reassuring.  Increase MiraLAX to up to 3 times daily, 1 capful to help with constipation.  Follow-up closely with gastroenterology.

## 2020-03-09 NOTE — ED Notes (Signed)
Pt tolerating oral intake at this time. 

## 2020-03-10 ENCOUNTER — Ambulatory Visit: Payer: BC Managed Care – PPO | Admitting: Family Medicine

## 2020-03-11 ENCOUNTER — Telehealth (INDEPENDENT_AMBULATORY_CARE_PROVIDER_SITE_OTHER): Payer: Self-pay | Admitting: Gastroenterology

## 2020-03-11 NOTE — Telephone Encounter (Signed)
Patient left voice mail message stating she went to the ED because of being constipated for a while - with little relief - has been taking miralax with little relief - states she is having a lot of nausea - please advise - ph# [phone]

## 2020-03-11 NOTE — Telephone Encounter (Signed)
I shared with Tawni Pummel the update since the patient was evaluated in the ED. She offered to call in a nausea medication and ask that we call to find out which med worked the best for her, Zofran or the Phenergan.  When patient was called  she declined prescription as she already had the phenergan on hand. Patient was ask if in addition to the Miralax and the instructions from the provider had she tried the enemas. She said no , it was suggested that she get two enemas. Continue to use the Deberah Pelton as she had been ask too. 'Use the first enema and hold as long as she could. One hour after she has voided from the first enema , use the second one the same way.  Per Liborio Nixon the patient was ask to go on a soft diet and drink lots of liquids , patient was made aware.  Also Liborio Nixon ask that the patient be given an appointment to be seen in the office next week. This post visit to ED for Constipation.

## 2020-03-15 ENCOUNTER — Encounter (INDEPENDENT_AMBULATORY_CARE_PROVIDER_SITE_OTHER): Payer: Self-pay | Admitting: Gastroenterology

## 2020-03-15 ENCOUNTER — Ambulatory Visit (INDEPENDENT_AMBULATORY_CARE_PROVIDER_SITE_OTHER): Payer: BC Managed Care – PPO | Admitting: Gastroenterology

## 2020-03-15 ENCOUNTER — Other Ambulatory Visit: Payer: Self-pay

## 2020-03-15 DIAGNOSIS — K581 Irritable bowel syndrome with constipation: Secondary | ICD-10-CM

## 2020-03-15 DIAGNOSIS — K589 Irritable bowel syndrome without diarrhea: Secondary | ICD-10-CM | POA: Insufficient documentation

## 2020-03-15 MED ORDER — LINACLOTIDE 72 MCG PO CAPS: 72.0000 ug | ORAL_CAPSULE | Freq: Every day | ORAL | 1 refills | Status: DC

## 2020-03-15 NOTE — Progress Notes (Signed)
Sabrina Duke, M.D. Gastroenterology & Hepatology [facility] [address] [place], [place] [address]  Primary Care Physician: Mardella Layman, MD [address] [place] [address]  I will communicate my assessment and recommendations to the referring MD via EMR. "Note: Occasional unusual wording and randomly placed punctuation marks may result from the use of speech recognition technology to transcribe this document"  Problems: 1. Irritable bowel syndrome 2. GERD  History of Present Illness: Sabrina Duke is a 64 y.o. female with past medical history of GERD, depression, hypothyroidism, iron deficiency anemia and migraines, who presents for follow up of left lower quadrant abdominal pain.  The patient was last seen on 01/26/2020. At that time, the patient was complaining of persistent nausea, for which she was counseled to take Carafate as needed.  The patient reports that she had to go to the ER on 03/08/2020 after she had presented recurrent constipation and left lower quadrant abdominal pain. Stated that she did not move her bowels in several days and that led to worsening abdominal pain in the lower abdomen, worse in the left lower quadrant. She took some MiraLAX as counseling the past without any improvement so decided to go to the ER. Underwent a CT of the abdomen without IV contrast which showed questionable wall thickening in the second and third portion of the duodenum possibly related to adenitis but there was also presence of moderate amount of stool throughout the colon without obstruction. Labs at that time showed normal CMP, with CBC showing white blood cell count of 3.8 and a normal hemoglobin of 14.6 and platelets of 141 slightly decreased. Urinalysis was negative. Patient was advised to follow-up with gastroenterology clinic as her symptoms were considered to be related to her constipation  The patient states that she  has been taking the MiraLAX more compliantly d states that 2 days after coming to the ER the bowel movements have become more regular. However, she has noticed that she may have some episodes of diarrhea intermittently, has to go sometimes nine times per day. Due to this she has had two titrate the MiraLAX and is currently taking 3/4 caps a day. States that her most concerning symptom at the moment is the presence of persistent left lower quadrant abdominal pain. Denies having the pain when she is sleeping. She has had any more nausea or vomiting, has not presented any weight loss, rectal bleeding, melena or any other complaint.  Notably, the patinet has been under significant stress as her brother recently passed away.. She also reports multiple other stressors in her life and she is states that at least for close family members had died in the recent times. She believes that stress has a major role in increasing her symptoms  Last EGD: 2020 -mild esophagitis at the GE junction, gastritis which was biopsied. Results of the pathology were negative for H. pylori. Last Colonoscopy: 2015 -normal colonoscopy, requested to repeat in 5 years  Past Medical History: Past Medical History:  Diagnosis Date  . Allergic rhinitis   . Decreased bone density   . Depression   . GERD (gastroesophageal reflux disease)   . Hypothyroid   . Iron deficiency anemia   . Low vitamin D level   . Migraine     Past Surgical History: Past Surgical History:  Procedure Laterality Date  . BIOPSY  03/10/2019   Procedure: BIOPSY;  Surgeon: Malissa Hippo, MD;  Location: [facility];  Service: Endoscopy;;  gastric   . ESOPHAGOGASTRODUODENOSCOPY N/A 03/10/2019   Procedure: ESOPHAGOGASTRODUODENOSCOPY (EGD);  Surgeon: Malissa Hippo, MD;  Location: [facility];  Service: Endoscopy;  Laterality: N/A;  2:40-rescheduled 10/5 @11 :55am per office  . hemroidectomy  1990  . OVARIAN CYST SURGERY  1972  . SHOULDER SURGERY Left  11/2017  . TUBAL LIGATION  1997  . uterine polyp removal  05/2015    Family History: Family History  Problem Relation Age of Onset  . Anxiety disorder Mother   . Depression Mother   . Bone cancer Mother   . OCD Mother   . Cerebral aneurysm Maternal Uncle   . Drug abuse Brother   . Depression Brother     Social History: Social History   Tobacco Use  Smoking Status Former Smoker  Smokeless Tobacco Never Used   Social History   Substance and Sexual Activity  Alcohol Use No   Social History   Substance and Sexual Activity  Drug Use No    Allergies: Allergies  Allergen Reactions  . Bentyl [Dicyclomine] Other (See Comments)    Patient states it caused leg and arm spasms.  06/2015 [Iodinated Diagnostic Agents] Anaphylaxis  . Antihistamines, Loratadine-Type Other (See Comments)    Sleepy  . Ciprofloxacin Nausea And Vomiting  . Dymista [Azelastine-Fluticasone] Other (See Comments)    Heachache  . Lortab [Hydrocodone-Acetaminophen] Nausea And Vomiting  . Macrobid [Nitrofurantoin] Other (See Comments)    Unknown  . Montelukast Other (See Comments)    Sleep  . Nsaids     Pt can tolerate small amounts of Aleve "mess my stomach up, have had ulcer in the past"  . Nurtec [Rimegepant Sulfate] Nausea Only  . Other     Patient not sure about these allergies  Darvocet-   Mermantin- "makes me sick on my stomach"  Bio Citrate Calcium with Vitamin D- "hurt stomach"     . Pepcid [Famotidine] Other (See Comments)    Headache  . Prednisone     "makes me hyper and gives me a headache"  . Prilosec [Omeprazole] Other (See Comments)    Headache  . Rosuvastatin Calcium Other (See Comments)    Headaches.  Ardine Bjork [Cefpodoxime] Nausea And Vomiting  . Zofran [Ondansetron] Other (See Comments)    Causes headaches per pt    Medications: Current Outpatient Medications  Medication Sig Dispense Refill  . Ascorbic Acid (VITAMIN C) 500 MG/5ML LIQD Take 1,000 mg by mouth  daily.    Varney Baas azelastine (ASTELIN) 0.1 % nasal spray Place 1 spray into both nostrils daily as needed for rhinitis. Use in each nostril as directed    . buPROPion (WELLBUTRIN SR) 100 MG 12 hr tablet Take 100 mg by mouth daily.    Marland Kitchen buPROPion (WELLBUTRIN XL) 150 MG 24 hr tablet Take 1 tablet (150 mg total) by mouth daily. 30 tablet 1  . Cholecalciferol (VITAMIN D) 125 MCG (5000 UT) CAPS Take 5,000 Units by mouth daily.    . Eszopiclone 3 MG TABS Take 1 tablet (3 mg total) by mouth at bedtime. Take immediately before bedtime 30 tablet 2  . levothyroxine (SYNTHROID) 50 MCG tablet Take 50 mcg by mouth daily before breakfast.    . Magnesium 200 MG TABS Take 200 mg by mouth daily.     . naratriptan (AMERGE) 2.5 MG tablet Take one tablet in AM and at bedtime for 5 days. 10 tablet 0  . polyethylene glycol (MIRALAX / GLYCOLAX) 17 g packet Take 17 g by  mouth daily as needed for moderate constipation.    . rizatriptan (MAXALT) 10 MG tablet Take 10 mg by mouth as needed for migraine. May repeat in 2 hours if needed    . topiramate (TOPAMAX) 50 MG tablet Take 3 tablets (150 mg total) by mouth at bedtime. 270 tablet 3  . Vitamin D, Ergocalciferol, (DRISDOL) 1.25 MG (50000 UNIT) CAPS capsule Take 50,000 Units by mouth once a week.    . Zinc Sulfate (ZINC 15 PO) Take 15 mg by mouth daily.    . promethazine (PHENERGAN) 25 MG tablet Take 1 tablet (25 mg total) by mouth every 6 (six) hours as needed for up to 10 days for nausea or vomiting. 30 tablet 0  . sucralfate (CARAFATE) 1 g tablet Take 1 tablet (1 g total) by mouth 4 (four) times daily as needed. (Patient not taking: Reported on 03/15/2020) 120 tablet 5   No current facility-administered medications for this visit.    Review of Systems: GENERAL: negative for malaise, night sweats HEENT: No changes in hearing or vision, no nose bleeds or other nasal problems. NECK: Negative for lumps, goiter, pain and significant neck swelling RESPIRATORY: Negative for  cough, wheezing CARDIOVASCULAR: Negative for chest pain, leg swelling, palpitations, orthopnea GI: SEE HPI MUSCULOSKELETAL: Negative for joint pain or swelling, back pain, and muscle pain. SKIN: Negative for lesions, rash PSYCH: Negative for sleep disturbance, mood disorder and recent psychosocial stressors. HEMATOLOGY Negative for prolonged bleeding, bruising easily, and swollen nodes. ENDOCRINE: Negative for cold or heat intolerance, polyuria, polydipsia and goiter. NEURO: negative for tremor, gait imbalance, syncope and seizures. The remainder of the review of systems is noncontributory.   Physical Exam: BP 99/64 (BP Location: Right Arm, Patient Position: Sitting, Cuff Size: Small)   Pulse 74   Temp 97.9 F (36.6 C) (Oral)   Ht 5\' 4"  (1.626 m)   Wt 144 lb 8 oz (65.5 kg)   BMI 24.80 kg/m  GENERAL: The patient is AO x3, in no acute distress. HEENT: Head is normocephalic and atraumatic. EOMI are intact. Mouth is well hydrated and without lesions. NECK: Supple. No masses LUNGS: Clear to auscultation. No presence of rhonchi/wheezing/rales. Adequate chest expansion HEART: RRR, normal s1 and s2. ABDOMEN: tender to palpation ijn the LLQ near the inguinal area, no guarding, no peritoneal signs, and nondistended. BS +. No masses. EXTREMITIES: Without any cyanosis, clubbing, rash, lesions or edema. NEUROLOGIC: AOx3, no focal motor deficit. SKIN: no jaundice, no rashes  Imaging/Labs: as above  I personally reviewed and interpreted the available labs, imaging and endoscopic files.  Impression and Plan: Sabrina Duke is a 64 y.o. female with past medical history of GERD, depression, hypothyroidism, iron deficiency anemia and migraines, who presents for follow up of left lower quadrant abdominal pain. The patient has presented chronic and recurrent symptoms of abdominal pain for which she has undergone extensive investigations including EGD and repeat CT of the abdomen which have been  negative. She clearly states that her symptoms have been related to stressors in the past. I suspect that her symptoms are likely related to a functional etiology/irritable bowel syndrome. The patient will benefit from commending a low FODMAP diet but also following her psychiatrist regarding the multiple stressors she is having. She may benefit of starting IBgard to decrease the abdominal cramping. Finally, she can start taking Linzess to improve her bowel movement frequency but also to decrease her intussusception. Patient understood and agreed.  - Follow up with psychiatrist - Explained presumed etiology  of IBS symptoms. Patient was counseled about the benefit of implementing a low FODMAP to improve symptoms and recurrent episodes. A dietary list was provided to the patient. Also, the patient was counseled about the benefit of avoiding stressing situations and potential environmental triggers leading to symptomatology. - Start IBGard 1 tablet every 8-12 hours as needed for abdominal pain - Start Linzess 72 mcg qday - To discuss screening colonoscopy in 5 years - RTC 3 months All questions were answered.      Dolores Frameaniel Castaneda Mayorga, MD Gastroenterology and Hepatology [facility]

## 2020-03-15 NOTE — Patient Instructions (Signed)
-   Explained presumed etiology of IBS symptoms. Patient was counseled about the benefit of implementing a low FODMAP to improve symptoms and recurrent episodes. A dietary list was provided to the patient. Also, the patient was counseled about the benefit of avoiding stressing situations and potential environmental triggers leading to symptomatology. - Start IBGard 1 tablet every 8-12 hours as needed for abdominal pain - Start Linzess 72 mcg qday

## 2020-03-18 ENCOUNTER — Encounter: Payer: Self-pay | Admitting: Psychiatry

## 2020-03-18 ENCOUNTER — Telehealth (INDEPENDENT_AMBULATORY_CARE_PROVIDER_SITE_OTHER): Payer: BC Managed Care – PPO | Admitting: Psychiatry

## 2020-03-18 ENCOUNTER — Other Ambulatory Visit: Payer: Self-pay

## 2020-03-18 ENCOUNTER — Telehealth (INDEPENDENT_AMBULATORY_CARE_PROVIDER_SITE_OTHER): Payer: Self-pay | Admitting: Gastroenterology

## 2020-03-18 DIAGNOSIS — F331 Major depressive disorder, recurrent, moderate: Secondary | ICD-10-CM

## 2020-03-18 DIAGNOSIS — F411 Generalized anxiety disorder: Secondary | ICD-10-CM

## 2020-03-18 DIAGNOSIS — F5101 Primary insomnia: Secondary | ICD-10-CM

## 2020-03-18 MED ORDER — SUMATRIPTAN SUCCINATE 100 MG PO TABS: 100.0000 mg | ORAL_TABLET | Freq: Once | ORAL | 2 refills | Status: DC | PRN

## 2020-03-18 MED ORDER — TOPIRAMATE 50 MG PO TABS
150.0000 mg | ORAL_TABLET | Freq: Every day | ORAL | 2 refills | Status: DC
Start: 2020-03-18 — End: 2020-06-15

## 2020-03-18 MED ORDER — WELLBUTRIN XL 150 MG PO TB24: 150.0000 mg | ORAL_TABLET | Freq: Every day | ORAL | 1 refills | Status: DC

## 2020-03-18 NOTE — Telephone Encounter (Signed)
I called and left a message for the patient to return call. 

## 2020-03-18 NOTE — Telephone Encounter (Signed)
Patient is aware of all. CLS 03/18/2020.

## 2020-03-18 NOTE — Telephone Encounter (Signed)
Please ask her to take the medication every other day. If still having multiple bowel movements, may need to hold the Linzess completely.  Katrinka Blazing, MD Gastroenterology and Hepatology [facility]

## 2020-03-18 NOTE — Progress Notes (Signed)
Sabrina Duke [id number] Dec 07, 1955 64 y.o.  Virtual Visit via Video Note  I connected with pt @ on 03/18/20 at  1:30 PM EDT by a video enabled telemedicine application and verified that I am speaking with the correct person using two identifiers.   I discussed the limitations of evaluation and management by telemedicine and the availability of in person appointments. The patient expressed understanding and agreed to proceed.  I discussed the assessment and treatment plan with the patient. The patient was provided an opportunity to ask questions and all were answered. The patient agreed with the plan and demonstrated an understanding of the instructions.   The patient was advised to call back or seek an in-person evaluation if the symptoms worsen or if the condition fails to improve as anticipated.  I provided 30 minutes of non-face-to-face time during this encounter.  The patient was located at home.  The provider was located at [facility].   Corie Chiquito, PMHNP   Subjective:   Patient ID:  Sabrina Duke is a 64 y.o. (DOB August 01, 1955) female.  Chief Complaint:  Chief Complaint  Patient presents with  . Anxiety  . Depression    HPI Sabrina Duke presents for follow-up of depression. She reports, "I have a lot of anxiety." She reports that she has been having crying episodes. She reports feeling "on edge... quick to anger... people just make me ill." She reports that this feeling is more intense in the morning. She reports that her mood has been lower with usual activities being restricted with pandemic, health issues, losing her 3 brothers in 3 years, and mother being in LTC. 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 Motivation has been somewhat low. She reports that she tried Wellbutrin SR two a day without improvement. She tried Wellbutrin XL in the morning and then the SR in the afternoon and energy was good and was  not having headaches but is experiencing increased anxiety, irritability, and jitteriness. She reports adequate sleep most nights. She reports that concentration "is a little fuzzy sometimes." She reports that she was dx'd with IBS and has been making dietary changes to try to alleviate s/s. She reports that her appetite has been chronically low. Denies SI.   Past medication trials: Wellbutrin- Effective, well tolerated Trintellix-GI side effects Pristiq- somewhat helpful Lexapro Celexa Prozac- Helped, caused headaches Cymbalta Rexulti Vraylar-jittery, hallucinations Latuda-joint aches Abilify-weight gain Lamictal BuSpar- Weight gain Topamax Trazodone-excessive somnolence Lunesta Ambien-ineffective Adderall XR Ativan-Effective  Review of Systems:  Review of Systems  Gastrointestinal:       Reports that GI s/s have improved some with dietary changes.   Musculoskeletal: Negative for gait problem.  Neurological: Negative for tremors and headaches.  Psychiatric/Behavioral:       Please refer to HPI    Medications: I have reviewed the patient's current medications.  Current Outpatient Medications  Medication Sig Dispense Refill  . buPROPion (WELLBUTRIN SR) 100 MG 12 hr tablet Take 100 mg by mouth daily.    . WELLBUTRIN XL 150 MG 24 hr tablet Take 1 tablet (150 mg total) by mouth daily. 30 tablet 1  . Ascorbic Acid (VITAMIN C) 500 MG/5ML LIQD Take 1,000 mg by mouth daily.    Marland Kitchen azelastine (ASTELIN) 0.1 % nasal spray Place 1 spray into both nostrils daily as needed for rhinitis. Use in each nostril as directed    . Cholecalciferol (VITAMIN D) 125 MCG (5000 UT) CAPS Take 5,000  Units by mouth daily.    . Eszopiclone 3 MG TABS Take 1 tablet (3 mg total) by mouth at bedtime. Take immediately before bedtime 30 tablet 2  . levothyroxine (SYNTHROID) 50 MCG tablet Take 50 mcg by mouth daily before breakfast.    . linaclotide (LINZESS) 72 MCG capsule Take 1 capsule (72 mcg total) by mouth  daily. 90 capsule 1  . Magnesium 200 MG TABS Take 200 mg by mouth daily.     . naratriptan (AMERGE) 2.5 MG tablet Take one tablet in AM and at bedtime for 5 days. 10 tablet 0  . polyethylene glycol (MIRALAX / GLYCOLAX) 17 g packet Take 17 g by mouth daily as needed for moderate constipation.    . promethazine (PHENERGAN) 25 MG tablet Take 1 tablet (25 mg total) by mouth every 6 (six) hours as needed for up to 10 days for nausea or vomiting. 30 tablet 0  . rizatriptan (MAXALT) 10 MG tablet Take 10 mg by mouth as needed for migraine. May repeat in 2 hours if needed    . sucralfate (CARAFATE) 1 g tablet Take 1 tablet (1 g total) by mouth 4 (four) times daily as needed. (Patient not taking: Reported on 03/15/2020) 120 tablet 5  . SUMAtriptan (IMITREX) 100 MG tablet Take 1 tablet (100 mg total) by mouth once as needed for up to 1 dose for migraine. May repeat in 2 hours if headache persists or recurs. 10 tablet 2  . topiramate (TOPAMAX) 50 MG tablet Take 3 tablets (150 mg total) by mouth at bedtime. 270 tablet 2  . Vitamin D, Ergocalciferol, (DRISDOL) 1.25 MG (50000 UNIT) CAPS capsule Take 50,000 Units by mouth once a week.    . Zinc Sulfate (ZINC 15 PO) Take 15 mg by mouth daily.     No current facility-administered medications for this visit.    Medication Side Effects: Irritability and Other: Jittery  Allergies:  Allergies  Allergen Reactions  . Bentyl [Dicyclomine] Other (See Comments)    Patient states it caused leg and arm spasms.  Ardine Bjork [Iodinated Diagnostic Agents] Anaphylaxis  . Antihistamines, Loratadine-Type Other (See Comments)    Sleepy  . Ciprofloxacin Nausea And Vomiting  . Dymista [Azelastine-Fluticasone] Other (See Comments)    Heachache  . Lortab [Hydrocodone-Acetaminophen] Nausea And Vomiting  . Macrobid [Nitrofurantoin] Other (See Comments)    Unknown  . Montelukast Other (See Comments)    Sleep  . Nsaids     Pt can tolerate small amounts of Aleve "mess my  stomach up, have had ulcer in the past"  . Nurtec [Rimegepant Sulfate] Nausea Only  . Other     Patient not sure about these allergies  Darvocet-   Mermantin- "makes me sick on my stomach"  Bio Citrate Calcium with Vitamin D- "hurt stomach"     . Pepcid [Famotidine] Other (See Comments)    Headache  . Prednisone     "makes me hyper and gives me a headache"  . Prilosec [Omeprazole] Other (See Comments)    Headache  . Rosuvastatin Calcium Other (See Comments)    Headaches.  Varney Baas [Cefpodoxime] Nausea And Vomiting  . Zofran [Ondansetron] Other (See Comments)    Causes headaches per pt    Past Medical History:  Diagnosis Date  . Allergic rhinitis   . Decreased bone density   . Depression   . GERD (gastroesophageal reflux disease)   . Hypothyroid   . Iron deficiency anemia   . Low vitamin D level   .  Migraine     Family History  Problem Relation Age of Onset  . Anxiety disorder Mother   . Depression Mother   . Bone cancer Mother   . OCD Mother   . Cerebral aneurysm Maternal Uncle   . Drug abuse Brother   . Depression Brother     Social History   Socioeconomic History  . Marital status: Married    Spouse name: Not on file  . Number of children: 1  . Years of education: Not on file  . Highest education level: High school graduate  Occupational History  . Occupation: disabled  Tobacco Use  . Smoking status: Former Games developermoker  . Smokeless tobacco: Never Used  Vaping Use  . Vaping Use: Never used  Substance and Sexual Activity  . Alcohol use: No  . Drug use: No  . Sexual activity: Not on file  Other Topics Concern  . Not on file  Social History Narrative   Lives at home with husband   Right handed   No caffeine   Social Determinants of Health   Financial Resource Strain:   . Difficulty of Paying Living Expenses: Not on file  Food Insecurity:   . Worried About Programme researcher, broadcasting/film/videounning Out of Food in the Last Year: Not on file  . Ran Out of Food in the Last Year:  Not on file  Transportation Needs:   . Lack of Transportation (Medical): Not on file  . Lack of Transportation (Non-Medical): Not on file  Physical Activity:   . Days of Exercise per Week: Not on file  . Minutes of Exercise per Session: Not on file  Stress:   . Feeling of Stress : Not on file  Social Connections:   . Frequency of Communication with Friends and Family: Not on file  . Frequency of Social Gatherings with Friends and Family: Not on file  . Attends Religious Services: Not on file  . Active Member of Clubs or Organizations: Not on file  . Attends BankerClub or Organization Meetings: Not on file  . Marital Status: Not on file  Intimate Partner Violence:   . Fear of Current or Ex-Partner: Not on file  . Emotionally Abused: Not on file  . Physically Abused: Not on file  . Sexually Abused: Not on file    Past Medical History, Surgical history, Social history, and Family history were reviewed and updated as appropriate.   Please see review of systems for further details on the patient's review from today.   Objective:   Physical Exam:  There were no vitals taken for this visit.  Physical Exam Neurological:     Mental Status: She is alert and oriented to person, place, and time.     Cranial Nerves: No dysarthria.  Psychiatric:        Attention and Perception: Attention and perception normal.        Mood and Affect: Mood is anxious.        Speech: Speech normal.        Behavior: Behavior is cooperative.        Thought Content: Thought content normal. Thought content is not paranoid or delusional. Thought content does not include homicidal or suicidal ideation. Thought content does not include homicidal or suicidal plan.        Cognition and Memory: Cognition and memory normal.        Judgment: Judgment normal.     Comments: Insight intact     Lab Review:     Component  Value Date/Time   NA 139 03/08/2020 2241   K 4.0 03/08/2020 2241   CL 109 03/08/2020 2241   CO2 22  03/08/2020 2241   GLUCOSE 97 03/08/2020 2241   BUN 17 03/08/2020 2241   CREATININE 0.96 03/08/2020 2241   CALCIUM 9.3 03/08/2020 2241   PROT 6.7 03/08/2020 2241   ALBUMIN 4.3 03/08/2020 2241   AST 20 03/08/2020 2241   ALT 14 03/08/2020 2241   ALKPHOS 78 03/08/2020 2241   BILITOT 0.5 03/08/2020 2241   GFRNONAA >60 03/08/2020 2241   GFRAA >60 03/08/2020 2241       Component Value Date/Time   WBC 3.8 (L) 03/08/2020 2241   RBC 4.92 03/08/2020 2241   HGB 14.6 03/08/2020 2241   HCT 43.5 03/08/2020 2241   PLT 141 (L) 03/08/2020 2241   MCV 88.4 03/08/2020 2241   MCH 29.7 03/08/2020 2241   MCHC 33.6 03/08/2020 2241   RDW 12.5 03/08/2020 2241   LYMPHSABS 0.9 01/20/2020 0354   MONOABS 0.3 01/20/2020 0354   EOSABS 0.1 01/20/2020 0354   BASOSABS 0.0 01/20/2020 0354    No results found for: POCLITH, LITHIUM   No results found for: PHENYTOIN, PHENOBARB, VALPROATE, CBMZ   .res Assessment: Plan:   Will submit prescription for brand name Wellbutrin XL 150 mg daily since patient has had inconsistent responses with generic Wellbutrin, both XL and SR formulations, and also has had varying side effects.  She has treatment resistant depression and has tried multiple medications with limited improvement and Wellbutrin has been the most effective medication for her depression.  Discussed that brand name Wellbutrin XL may be more consistent and less likely to cause side effects she has experienced with some generics to include headaches, anxiety, and irritability.  Will fax co-pay savings card for Wellbutrin XL to patient's pharmacy. Continue Lunesta 3 mg at bedtime for insomnia. Discussed potential benefits of therapy and encouraged patient to consider starting therapy or grief counseling with hospice. Patient to follow-up in 4 to 6 weeks or sooner if clinically indicated. Patient advised to contact office with any questions, adverse effects, or acute worsening in signs and symptoms.   Sabrina Duke was  seen today for anxiety and depression.  Diagnoses and all orders for this visit:  Moderate episode of recurrent major depressive disorder (HCC) -     WELLBUTRIN XL 150 MG 24 hr tablet; Take 1 tablet (150 mg total) by mouth daily.  Anxiety state  Primary insomnia     Please see After Visit Summary for patient specific instructions.  Future Appointments  Date Time Provider Department Center  03/22/2020  1:00 PM [facility] PAT 2 [facility] None  03/22/2020  1:15 PM [facility] PAT 2 [facility] None  06/14/2020 11:15 AM Dolores Frame, MD [facility] None  06/15/2020  2:00 PM Nicholas Lose, Amy, NP [facility] None  07/29/2020 11:15 AM Levonne Hubert, PA-C [facility] None    No orders of the defined types were placed in this encounter.     -------------------------------

## 2020-03-18 NOTE — Telephone Encounter (Signed)
Patient left voice mail message stating she has been taking linzess and yesterday she had 18 bowel movements - stated Dr Levon Hedger told her what to do if she had more than 4 bm's but didn't say what to do if she had 18 - please advise - ph# [phone]

## 2020-03-19 NOTE — Patient Instructions (Signed)
Sabrina Duke  03/19/2020     @PREFPERIOPPHARMACY @   Your procedure is scheduled on  03/24/2020.  Report to 03/26/2020 at  0915  A.M.  Call this number if you have problems the morning of surgery:  [phone]   Remember:  Follow the diet and prep instructions given to you by the office.                       Take these medicines the morning of surgery with A SIP OF WATER  Wellbutrin, levothyroxine, phenergan(if needed), maxale and or imitrex(if needed).    Do not wear jewelry, make-up or nail polish.  Do not wear lotions, powders, or perfumes. Please wear deodorant and brush your teeth.  Do not shave 48 hours prior to surgery.  Men may shave face and neck.  Do not bring valuables to the hospital.  [facility] is not responsible for any belongings or valuables.  Contacts, dentures or bridgework may not be worn into surgery.  Leave your suitcase in the car.  After surgery it may be brought to your room.  For patients admitted to the hospital, discharge time will be determined by your treatment team.  Patients discharged the day of surgery will not be allowed to drive home.   Name and phone number of your driver:   family Special instructions:  DO NOT smoke the morning of your procedure.  Please read over the following fact sheets that you were given. Anesthesia Post-op Instructions and Care and Recovery After Surgery       Upper Endoscopy, Adult, Care After This sheet gives you information about how to care for yourself after your procedure. Your health care provider may also give you more specific instructions. If you have problems or questions, contact your health care provider. What can I expect after the procedure? After the procedure, it is common to have:  A sore throat.  Mild stomach pain or discomfort.  Bloating.  Nausea. Follow these instructions at home:   Follow instructions from your health care provider about what to eat or drink after  your procedure.  Return to your normal activities as told by your health care provider. Ask your health care provider what activities are safe for you.  Take over-the-counter and prescription medicines only as told by your health care provider.  Do not drive for 24 hours if you were given a sedative during your procedure.  Keep all follow-up visits as told by your health care provider. This is important. Contact a health care provider if you have:  A sore throat that lasts longer than one day.  Trouble swallowing. Get help right away if:  You vomit blood or your vomit looks like coffee grounds.  You have: ? A fever. ? Bloody, black, or tarry stools. ? A severe sore throat or you cannot swallow. ? Difficulty breathing. ? Severe pain in your chest or abdomen. Summary  After the procedure, it is common to have a sore throat, mild stomach discomfort, bloating, and nausea.  Do not drive for 24 hours if you were given a sedative during the procedure.  Follow instructions from your health care provider about what to eat or drink after your procedure.  Return to your normal activities as told by your health care provider. This information is not intended to replace advice given to you by your health care provider. Make sure you discuss any questions you  have with your health care provider. Document Revised: 11/13/2017 Document Reviewed: 10/22/2017 Elsevier Patient Education  2020 [employer].  Colonoscopy, Adult, Care After This sheet gives you information about how to care for yourself after your procedure. Your health care provider may also give you more specific instructions. If you have problems or questions, contact your health care provider. What can I expect after the procedure? After the procedure, it is common to have:  A small amount of blood in your stool for 24 hours after the procedure.  Some gas.  Mild cramping or bloating of your abdomen. Follow these  instructions at home: Eating and drinking   Drink enough fluid to keep your urine pale yellow.  Follow instructions from your health care provider about eating or drinking restrictions.  Resume your normal diet as instructed by your health care provider. Avoid heavy or fried foods that are hard to digest. Activity  Rest as told by your health care provider.  Avoid sitting for a long time without moving. Get up to take short walks every 1-2 hours. This is important to improve blood flow and breathing. Ask for help if you feel weak or unsteady.  Return to your normal activities as told by your health care provider. Ask your health care provider what activities are safe for you. Managing cramping and bloating   Try walking around when you have cramps or feel bloated.  Apply heat to your abdomen as told by your health care provider. Use the heat source that your health care provider recommends, such as a moist heat pack or a heating pad. ? Place a towel between your skin and the heat source. ? Leave the heat on for 20-30 minutes. ? Remove the heat if your skin turns bright red. This is especially important if you are unable to feel pain, heat, or cold. You may have a greater risk of getting burned. General instructions  For the first 24 hours after the procedure: ? Do not drive or use machinery. ? Do not sign important documents. ? Do not drink alcohol. ? Do your regular daily activities at a slower pace than normal. ? Eat soft foods that are easy to digest.  Take over-the-counter and prescription medicines only as told by your health care provider.  Keep all follow-up visits as told by your health care provider. This is important. Contact a health care provider if:  You have blood in your stool 2-3 days after the procedure. Get help right away if you have:  More than a small spotting of blood in your stool.  Large blood clots in your stool.  Swelling of your abdomen.   Nausea or vomiting.  A fever.  Increasing pain in your abdomen that is not relieved with medicine. Summary  After the procedure, it is common to have a small amount of blood in your stool. You may also have mild cramping and bloating of your abdomen.  For the first 24 hours after the procedure, do not drive or use machinery, sign important documents, or drink alcohol.  Get help right away if you have a lot of blood in your stool, nausea or vomiting, a fever, or increased pain in your abdomen. This information is not intended to replace advice given to you by your health care provider. Make sure you discuss any questions you have with your health care provider. Document Revised: 12/16/2018 Document Reviewed: 12/16/2018 Elsevier Patient Education  2020 [employer] Monitored Anesthesia Care, Care After These instructions provide  you with information about caring for yourself after your procedure. Your health care provider may also give you more specific instructions. Your treatment has been planned according to current medical practices, but problems sometimes occur. Call your health care provider if you have any problems or questions after your procedure. What can I expect after the procedure? After your procedure, you may:  Feel sleepy for several hours.  Feel clumsy and have poor balance for several hours.  Feel forgetful about what happened after the procedure.  Have poor judgment for several hours.  Feel nauseous or vomit.  Have a sore throat if you had a breathing tube during the procedure. Follow these instructions at home: For at least 24 hours after the procedure:      Have a responsible adult stay with you. It is important to have someone help care for you until you are awake and alert.  Rest as needed.  Do not: ? Participate in activities in which you could fall or become injured. ? Drive. ? Use heavy machinery. ? Drink alcohol. ? Take sleeping pills or  medicines that cause drowsiness. ? Make important decisions or sign legal documents. ? Take care of children on your own. Eating and drinking  Follow the diet that is recommended by your health care provider.  If you vomit, drink water, juice, or soup when you can drink without vomiting.  Make sure you have little or no nausea before eating solid foods. General instructions  Take over-the-counter and prescription medicines only as told by your health care provider.  If you have sleep apnea, surgery and certain medicines can increase your risk for breathing problems. Follow instructions from your health care provider about wearing your sleep device: ? Anytime you are sleeping, including during daytime naps. ? While taking prescription pain medicines, sleeping medicines, or medicines that make you drowsy.  If you smoke, do not smoke without supervision.  Keep all follow-up visits as told by your health care provider. This is important. Contact a health care provider if:  You keep feeling nauseous or you keep vomiting.  You feel light-headed.  You develop a rash.  You have a fever. Get help right away if:  You have trouble breathing. Summary  For several hours after your procedure, you may feel sleepy and have poor judgment.  Have a responsible adult stay with you for at least 24 hours or until you are awake and alert. This information is not intended to replace advice given to you by your health care provider. Make sure you discuss any questions you have with your health care provider. Document Revised: 08/20/2017 Document Reviewed: 09/12/2015 Elsevier Patient Education  2020 [employer].

## 2020-03-22 ENCOUNTER — Encounter (HOSPITAL_COMMUNITY)
Admission: RE | Admit: 2020-03-22 | Discharge: 2020-03-22 | Disposition: A | Discharge status: Home / Self Care | Payer: BC Managed Care – PPO | Source: Ambulatory Visit | Attending: Internal Medicine | Admitting: Internal Medicine

## 2020-03-22 ENCOUNTER — Encounter (HOSPITAL_COMMUNITY): Payer: Self-pay

## 2020-03-22 ENCOUNTER — Other Ambulatory Visit (HOSPITAL_COMMUNITY)
Admission: RE | Admit: 2020-03-22 | Discharge: 2020-03-22 | Disposition: A | Discharge status: Home / Self Care | Payer: BC Managed Care – PPO | Source: Ambulatory Visit | Attending: Internal Medicine | Admitting: Internal Medicine

## 2020-03-22 ENCOUNTER — Other Ambulatory Visit: Payer: Self-pay

## 2020-03-22 ENCOUNTER — Telehealth: Payer: Self-pay | Admitting: Adult Health

## 2020-03-22 DIAGNOSIS — Z01812 Encounter for preprocedural laboratory examination: Secondary | ICD-10-CM | POA: Insufficient documentation

## 2020-03-22 DIAGNOSIS — R197 Diarrhea, unspecified: Secondary | ICD-10-CM

## 2020-03-22 DIAGNOSIS — Z20822 Contact with and (suspected) exposure to covid-19: Secondary | ICD-10-CM | POA: Diagnosis not present

## 2020-03-22 DIAGNOSIS — R1013 Epigastric pain: Secondary | ICD-10-CM

## 2020-03-22 DIAGNOSIS — R110 Nausea: Secondary | ICD-10-CM

## 2020-03-22 DIAGNOSIS — K219 Gastro-esophageal reflux disease without esophagitis: Secondary | ICD-10-CM

## 2020-03-22 LAB — SARS CORONAVIRUS 2 (TAT 6-24 HRS): SARS Coronavirus 2: NEGATIVE

## 2020-03-22 MED ORDER — TOPIRAMATE 50 MG PO TABS: ORAL_TABLET | ORAL | 0 refills | Status: DC

## 2020-03-22 NOTE — Telephone Encounter (Signed)
Pt called, need 10 day supply for  topiramate (TOPAMAX) 50 MG tablet at [facility] Send 10 day supply to local pharmacy. Would like a call from the nurse.

## 2020-03-24 ENCOUNTER — Ambulatory Visit (HOSPITAL_COMMUNITY): Payer: BC Managed Care – PPO | Admitting: Anesthesiology

## 2020-03-24 ENCOUNTER — Other Ambulatory Visit: Payer: Self-pay

## 2020-03-24 ENCOUNTER — Ambulatory Visit (HOSPITAL_COMMUNITY)
Admission: RE | Admit: 2020-03-24 | Discharge: 2020-03-24 | Disposition: A | Discharge status: Home / Self Care | Payer: BC Managed Care – PPO | Attending: Internal Medicine | Admitting: Internal Medicine

## 2020-03-24 ENCOUNTER — Encounter (HOSPITAL_COMMUNITY)
Admission: RE | Disposition: A | Discharge status: Home / Self Care | Payer: Self-pay | Source: Home / Self Care | Attending: Internal Medicine

## 2020-03-24 ENCOUNTER — Encounter (HOSPITAL_COMMUNITY): Payer: Self-pay | Admitting: Internal Medicine

## 2020-03-24 DIAGNOSIS — Z1211 Encounter for screening for malignant neoplasm of colon: Secondary | ICD-10-CM | POA: Insufficient documentation

## 2020-03-24 DIAGNOSIS — K589 Irritable bowel syndrome without diarrhea: Secondary | ICD-10-CM | POA: Diagnosis not present

## 2020-03-24 DIAGNOSIS — R1013 Epigastric pain: Secondary | ICD-10-CM

## 2020-03-24 DIAGNOSIS — F32A Depression, unspecified: Secondary | ICD-10-CM | POA: Diagnosis not present

## 2020-03-24 DIAGNOSIS — E039 Hypothyroidism, unspecified: Secondary | ICD-10-CM | POA: Diagnosis not present

## 2020-03-24 DIAGNOSIS — K219 Gastro-esophageal reflux disease without esophagitis: Secondary | ICD-10-CM | POA: Insufficient documentation

## 2020-03-24 DIAGNOSIS — K644 Residual hemorrhoidal skin tags: Secondary | ICD-10-CM | POA: Diagnosis not present

## 2020-03-24 DIAGNOSIS — K319 Disease of stomach and duodenum, unspecified: Secondary | ICD-10-CM | POA: Insufficient documentation

## 2020-03-24 DIAGNOSIS — K3189 Other diseases of stomach and duodenum: Secondary | ICD-10-CM | POA: Insufficient documentation

## 2020-03-24 DIAGNOSIS — Z800 Family history of malignant neoplasm of digestive organs: Secondary | ICD-10-CM | POA: Insufficient documentation

## 2020-03-24 DIAGNOSIS — Z79899 Other long term (current) drug therapy: Secondary | ICD-10-CM | POA: Diagnosis not present

## 2020-03-24 DIAGNOSIS — G43909 Migraine, unspecified, not intractable, without status migrainosus: Secondary | ICD-10-CM | POA: Insufficient documentation

## 2020-03-24 DIAGNOSIS — Z87891 Personal history of nicotine dependence: Secondary | ICD-10-CM | POA: Insufficient documentation

## 2020-03-24 DIAGNOSIS — R110 Nausea: Secondary | ICD-10-CM

## 2020-03-24 DIAGNOSIS — R197 Diarrhea, unspecified: Secondary | ICD-10-CM

## 2020-03-24 DIAGNOSIS — K2980 Duodenitis without bleeding: Secondary | ICD-10-CM | POA: Diagnosis not present

## 2020-03-24 DIAGNOSIS — Z79890 Hormone replacement therapy (postmenopausal): Secondary | ICD-10-CM | POA: Diagnosis not present

## 2020-03-24 HISTORY — PX: ESOPHAGOGASTRODUODENOSCOPY (EGD) WITH PROPOFOL: SHX5813

## 2020-03-24 HISTORY — PX: BIOPSY: SHX5522

## 2020-03-24 HISTORY — PX: COLONOSCOPY WITH PROPOFOL: SHX5780

## 2020-03-24 LAB — HM COLONOSCOPY

## 2020-03-24 SURGERY — COLONOSCOPY WITH PROPOFOL
Anesthesia: General

## 2020-03-24 MED ORDER — PROPOFOL 10 MG/ML IV BOLUS
INTRAVENOUS | Status: DC | PRN
  Administered 2020-03-24: 100 mg via INTRAVENOUS

## 2020-03-24 MED ORDER — PROPOFOL 500 MG/50ML IV EMUL
INTRAVENOUS | Status: DC | PRN
  Administered 2020-03-24: 150 ug/kg/min via INTRAVENOUS

## 2020-03-24 MED ORDER — FENTANYL CITRATE (PF) 100 MCG/2ML IJ SOLN
25.0000 ug | INTRAMUSCULAR | Status: DC | PRN
  Administered 2020-03-24: 25 ug via INTRAVENOUS

## 2020-03-24 MED ORDER — CHLORHEXIDINE GLUCONATE CLOTH 2 % EX PADS: 6.0000 | MEDICATED_PAD | Freq: Once | CUTANEOUS | Status: DC

## 2020-03-24 MED ORDER — GLYCOPYRROLATE 0.2 MG/ML IJ SOLN
INTRAMUSCULAR | Status: AC
  Filled 2020-03-24: qty 1

## 2020-03-24 MED ORDER — LIDOCAINE VISCOUS HCL 2 % MT SOLN
15.0000 mL | Freq: Once | OROMUCOSAL | Status: AC
  Administered 2020-03-24: 15 mL via OROMUCOSAL

## 2020-03-24 MED ORDER — LACTATED RINGERS IV SOLN: INTRAVENOUS | Status: DC | PRN

## 2020-03-24 MED ORDER — GLYCOPYRROLATE 0.2 MG/ML IJ SOLN
0.2000 mg | Freq: Once | INTRAMUSCULAR | Status: AC
  Administered 2020-03-24: 0.2 mg via INTRAVENOUS

## 2020-03-24 MED ORDER — RABEPRAZOLE SODIUM 20 MG PO TBEC
20.0000 mg | DELAYED_RELEASE_TABLET | Freq: Every day | ORAL | 5 refills | Status: DC
Start: 2020-03-24 — End: 2020-09-01

## 2020-03-24 MED ORDER — FENTANYL CITRATE (PF) 100 MCG/2ML IJ SOLN
INTRAMUSCULAR | Status: AC
  Filled 2020-03-24: qty 2

## 2020-03-24 MED ORDER — LIDOCAINE VISCOUS HCL 2 % MT SOLN
OROMUCOSAL | Status: AC
  Filled 2020-03-24: qty 15

## 2020-03-24 MED ORDER — LACTATED RINGERS IV SOLN
INTRAVENOUS | Status: DC
  Administered 2020-03-24: 1000 mL via INTRAVENOUS

## 2020-03-24 NOTE — Op Note (Signed)
[facility] Patient Name: Sabrina Duke Procedure Date: 03/24/2020 10:43 AM MRN: [id number] Date of Birth: 08/18/1955 Attending MD: Sabrina December , MD CSN: [id number] Age: 64 Admit Type: Outpatient Procedure:                Upper GI endoscopy Indications:              Follow-up of gastro-esophageal reflux disease,                            Nausea Providers:                Sabrina December, MD, Loma Messing B. Patsy Lager, RN, Pandora Leiter, Technician Referring MD:             Mardella Layman, MD Medicines:                Propofol per Anesthesia Complications:            No immediate complications. Estimated Blood Loss:     Estimated blood loss was minimal. Procedure:                Pre-Anesthesia Assessment:                           - Prior to the procedure, a History and Physical                            was performed, and patient medications and                            allergies were reviewed. The patient's tolerance of                            previous anesthesia was also reviewed. The risks                            and benefits of the procedure and the sedation                            options and risks were discussed with the patient.                            All questions were answered, and informed consent                            was obtained. Prior Anticoagulants: The patient has                            taken no previous anticoagulant or antiplatelet                            agents. ASA Grade Assessment: II - A patient with  mild systemic disease. After reviewing the risks                            and benefits, the patient was deemed in                            satisfactory condition to undergo the procedure.                           After obtaining informed consent, the endoscope was                            passed under direct vision. Throughout the                            procedure, the  patient's blood pressure, pulse, and                            oxygen saturations were monitored continuously. The                            GIF-H190 ([id number]) scope was introduced through the                            mouth, and advanced to the second part of duodenum.                            The upper GI endoscopy was accomplished without                            difficulty. The patient tolerated the procedure                            well. Scope In: 11:11:08 AM Scope Out: 11:18:49 AM Total Procedure Duration: 0 hours 7 minutes 41 seconds  Findings:      The hypopharynx was normal.      The examined esophagus was normal.      The Z-line was regular and was found 39 cm from the incisors.      Patchy mild inflammation characterized by erythema and granularity was       found in the gastric antrum. Biopsies were taken with a cold forceps for       histology. The pathology specimen was placed into Bottle Number 2.      The exam of the stomach was otherwise normal.      The duodenal bulb and second portion of the duodenum were normal.       Biopsies were taken with a cold forceps for histology. The pathology       specimen was placed into Bottle Number 1. Impression:               - Normal hypopharynx.                           - Normal esophagus.                           -  Z-line regular, 39 cm from the incisors.                           - Gastritis. Biopsied.                           - Normal duodenal bulb and second portion of the                            duodenum. Biopsied. Moderate Sedation:      Per Anesthesia Care Recommendation:           - Patient has a contact number available for                            emergencies. The signs and symptoms of potential                            delayed complications were discussed with the                            patient. Return to normal activities tomorrow.                            Written discharge instructions were  provided to the                            patient.                           - Resume previous diet today.                           - Continue present medications.                           - Use Aciphex (rabeprazole) 20 mg PO daily.                           - Await pathology results. Procedure Code(s):        --- Professional ---                           [phone], Esophagogastroduodenoscopy, flexible,                            transoral; with biopsy, single or multiple Diagnosis Code(s):        --- Professional ---                           K29.70, Gastritis, unspecified, without bleeding                           K21.9, Gastro-esophageal reflux disease without                            esophagitis  R11.0, Nausea CPT copyright 2019 American Medical Association. All rights reserved. The codes documented in this report are preliminary and upon coder review may  be revised to meet current compliance requirements. Sabrina DecemberNajeeb Alisa Stjames, MD Sabrina DecemberNajeeb Maricella Filyaw, MD 03/24/2020 12:19:47 PM This report has been signed electronically. Number of Addenda: 0

## 2020-03-24 NOTE — Op Note (Signed)
[facility] Patient Name: Sabrina MoatsSherry Duke Procedure Date: 03/24/2020 11:21 AM MRN: [id number] Date of Birth: 03/07/1956 Attending MD: Sabrina [facility] , MD CSN: [id number] Age: 64 Admit Type: Outpatient Procedure:                Colonoscopy Indications:              Colon cancer screening in patient at increased                            risk: Family history of colorectal cancer in                            multiple 2nd degree relatives Providers:                Sabrina DecemberNajeeb Jasmyn Picha, MD, Sabrina MessingLurae B. Patsy LagerAlbert RN, Duke, Sabrina LeiterNeville                            Duke, Technician Referring MD:             Sabrina LaymanKavita Tripathi, MD Medicines:                Propofol per Anesthesia Complications:            No immediate complications. Estimated Blood Loss:     Estimated blood loss: none. Procedure:                Pre-Anesthesia Assessment:                           - Prior to the procedure, a History and Physical                            was performed, and patient medications and                            allergies were reviewed. The patient's tolerance of                            previous anesthesia was also reviewed. The risks                            and benefits of the procedure and the sedation                            options and risks were discussed with the patient.                            All questions were answered, and informed consent                            was obtained. Prior Anticoagulants: The patient has                            taken no previous anticoagulant or antiplatelet  agents. ASA Grade Assessment: II - A patient with                            mild systemic disease. After reviewing the risks                            and benefits, the patient was deemed in                            satisfactory condition to undergo the procedure.                           - Prior to the procedure, a History and Physical                            was performed,  and patient medications and                            allergies were reviewed. The patient's tolerance of                            previous anesthesia was also reviewed. The risks                            and benefits of the procedure and the sedation                            options and risks were discussed with the patient.                            All questions were answered, and informed consent                            was obtained. Prior Anticoagulants: The patient has                            taken no previous anticoagulant or antiplatelet                            agents. ASA Grade Assessment: II - A patient with                            mild systemic disease. After reviewing the risks                            and benefits, the patient was deemed in                            satisfactory condition to undergo the procedure.                           After obtaining informed consent, the colonoscope  was passed under direct vision. Throughout the                            procedure, the patient's blood pressure, pulse, and                            oxygen saturations were monitored continuously. The                            PCF-H190DL ([id number]) scope was introduced through                            the anus and advanced to the the cecum, identified                            by appendiceal orifice and ileocecal valve. The                            colonoscopy was technically difficult and complex                            due to a redundant sigmoid colon. Successful                            completion of the procedure was aided by                            withdrawing the scope and replacing with the                            'babyscope'. The patient tolerated the procedure                            well. The quality of the bowel preparation was                            excellent except the ascending colon was fair. The                             ileocecal valve, appendiceal orifice, and rectum                            were photographed. Scope In: 11:23:52 AM Scope Out: 11:55:21 AM Scope Withdrawal Time: 0 hours 5 minutes 17 seconds  Total Procedure Duration: 0 hours 31 minutes 29 seconds  Findings:      Skin tags were found on perianal exam.      The colon (entire examined portion) appeared normal.      External hemorrhoids were found during retroflexion. The hemorrhoids       were small. Impression:               - Perianal skin tags found on perianal exam.                           -  The entire examined colon is normal.                           - External hemorrhoids.                           - No specimens collected. Moderate Sedation:      Per Anesthesia Care Recommendation:           - Patient has a contact number available for                            emergencies. The signs and symptoms of potential                            delayed complications were discussed with the                            patient. Return to normal activities tomorrow.                            Written discharge instructions were provided to the                            patient.                           - High fiber diet today.                           - Continue present medications.                           - Repeat colonoscopy in 5 years for screening                            purposes. Procedure Code(s):        --- Professional ---                           [phone], Colonoscopy, flexible; diagnostic, including                            collection of specimen(s) by brushing or washing,                            when performed (separate procedure) Diagnosis Code(s):        --- Professional ---                           K64.4, Residual hemorrhoidal skin tags                           Z80.0, Family history of malignant neoplasm of                            digestive organs CPT copyright 2019 American Medical  Association. All  rights reserved. The codes documented in this report are preliminary and upon coder review may  be revised to meet current compliance requirements. Sabrina December, MD Sabrina December, MD 03/24/2020 12:23:35 PM This report has been signed electronically. Number of Addenda: 0

## 2020-03-24 NOTE — Anesthesia Postprocedure Evaluation (Signed)
Anesthesia Post Note  Patient: Sabrina Duke  Procedure(s) Performed: COLONOSCOPY WITH PROPOFOL (N/A ) ESOPHAGOGASTRODUODENOSCOPY (EGD) WITH PROPOFOL (N/A ) BIOPSY  Patient location during evaluation: PACU Anesthesia Type: General Level of consciousness: awake, oriented, awake and alert and patient cooperative Pain management: satisfactory to patient Vital Signs Assessment: post-procedure vital signs reviewed and stable Respiratory status: spontaneous breathing, respiratory function stable and nonlabored ventilation Cardiovascular status: stable Postop Assessment: no apparent nausea or vomiting Anesthetic complications: no   No complications documented.   Last Vitals:  Vitals:   03/24/20 0933  BP: 115/72  Resp: 18  Temp: 36.5 C  SpO2: 100%    Last Pain:  Vitals:   03/24/20 1110  TempSrc:   PainSc: 10-Worst pain ever                 Annasofia Pohl

## 2020-03-24 NOTE — Discharge Instructions (Signed)
No aspirin or NSAIDs for 24 hours. AcipHex/Rebaprazole 20 mg by mouth 30 minutes before breakfast daily. Resume other medications as before. High-fiber diet. No driving for 24 hours. Physician will call with biopsy results.   Colonoscopy, Adult, Care After This sheet gives you information about how to care for yourself after your procedure. Your health care provider may also give you more specific instructions. If you have problems or questions, contact your health care provider. What can I expect after the procedure? After the procedure, it is common to have:  A small amount of blood in your stool for 24 hours after the procedure.  Some gas.  Mild cramping or bloating of your abdomen. Follow these instructions at home: Eating and drinking   Drink enough fluid to keep your urine pale yellow.  Follow instructions from your health care provider about eating or drinking restrictions.  Resume your normal diet as instructed by your health care provider. Avoid heavy or fried foods that are hard to digest. Activity  Rest as told by your health care provider.  Avoid sitting for a long time without moving. Get up to take short walks every 1-2 hours. This is important to improve blood flow and breathing. Ask for help if you feel weak or unsteady.  Return to your normal activities as told by your health care provider. Ask your health care provider what activities are safe for you. Managing cramping and bloating   Try walking around when you have cramps or feel bloated.  Apply heat to your abdomen as told by your health care provider. Use the heat source that your health care provider recommends, such as a moist heat pack or a heating pad. ? Place a towel between your skin and the heat source. ? Leave the heat on for 20-30 minutes. ? Remove the heat if your skin turns bright red. This is especially important if you are unable to feel pain, heat, or cold. You may have a greater risk of  getting burned. General instructions  For the first 24 hours after the procedure: ? Do not drive or use machinery. ? Do not sign important documents. ? Do not drink alcohol. ? Do your regular daily activities at a slower pace than normal. ? Eat soft foods that are easy to digest.  Take over-the-counter and prescription medicines only as told by your health care provider.  Keep all follow-up visits as told by your health care provider. This is important. Contact a health care provider if:  You have blood in your stool 2-3 days after the procedure. Get help right away if you have:  More than a small spotting of blood in your stool.  Large blood clots in your stool.  Swelling of your abdomen.  Nausea or vomiting.  A fever.  Increasing pain in your abdomen that is not relieved with medicine. Summary  After the procedure, it is common to have a small amount of blood in your stool. You may also have mild cramping and bloating of your abdomen.  For the first 24 hours after the procedure, do not drive or use machinery, sign important documents, or drink alcohol.  Get help right away if you have a lot of blood in your stool, nausea or vomiting, a fever, or increased pain in your abdomen. This information is not intended to replace advice given to you by your health care provider. Make sure you discuss any questions you have with your health care provider. Document Revised: 12/16/2018 Document Reviewed:  12/16/2018 Elsevier Patient Education  2020 [employer] PATIENT INSTRUCTIONS POST-ANESTHESIA  IMMEDIATELY FOLLOWING SURGERY:  Do not drive or operate machinery for the first twenty four hours after surgery.  Do not make any important decisions for twenty four hours after surgery or while taking narcotic pain medications or sedatives.  If you develop intractable nausea and vomiting or a severe headache please notify your doctor immediately.  FOLLOW-UP:  Please make an appointment  with your surgeon as instructed. You do not need to follow up with anesthesia unless specifically instructed to do so.  WOUND CARE INSTRUCTIONS (if applicable):  Keep a dry clean dressing on the anesthesia/puncture wound site if there is drainage.  Once the wound has quit draining you may leave it open to air.  Generally you should leave the bandage intact for twenty four hours unless there is drainage.  If the epidural site drains for more than 36-48 hours please call the anesthesia department.  QUESTIONS?:  Please feel free to call your physician or the hospital operator if you have any questions, and they will be happy to assist you.

## 2020-03-24 NOTE — Transfer of Care (Signed)
Immediate Anesthesia Transfer of Care Note  Patient: KASHAYLA UNGERER  Procedure(s) Performed: COLONOSCOPY WITH PROPOFOL (N/A ) ESOPHAGOGASTRODUODENOSCOPY (EGD) WITH PROPOFOL (N/A ) BIOPSY  Patient Location: PACU  Anesthesia Type:General  Level of Consciousness: awake, alert , oriented and patient cooperative  Airway & Oxygen Therapy: Patient Spontanous Breathing  Post-op Assessment: Report given to RN, Post -op Vital signs reviewed and stable and Patient moving all extremities X 4  Post vital signs: Reviewed and stable  Last Vitals:  Vitals Value Taken Time  BP    Temp    Pulse 70 03/24/20 1200  Resp 15 03/24/20 1200  SpO2 100 % 03/24/20 1200  Vitals shown include unvalidated device data.  Last Pain:  Vitals:   03/24/20 1110  TempSrc:   PainSc: 10-Worst pain ever      Patients Stated Pain Goal: 8 (03/24/20 0933)  Complications: No complications documented.

## 2020-03-24 NOTE — Anesthesia Preprocedure Evaluation (Signed)
Anesthesia Evaluation  Patient identified by MRN, date of birth, ID band Patient awake    Reviewed: Allergy & Precautions, H&P , NPO status , Patient's Chart, lab work & pertinent test results, reviewed documented beta blocker date and time   Airway Mallampati: II  TM Distance: >3 FB Neck ROM: full    Dental no notable dental hx. (+) Teeth Intact   Pulmonary neg pulmonary ROS, former smoker,    Pulmonary exam normal breath sounds clear to auscultation       Cardiovascular Exercise Tolerance: Good negative cardio ROS   Rhythm:regular Rate:Normal     Neuro/Psych  Headaches, PSYCHIATRIC DISORDERS Anxiety Depression    GI/Hepatic Neg liver ROS, GERD  Medicated,  Endo/Other  Hypothyroidism   Renal/GU negative Renal ROS  negative genitourinary   Musculoskeletal   Abdominal   Peds  Hematology  (+) Blood dyscrasia, anemia ,   Anesthesia Other Findings Current migraine  Reproductive/Obstetrics negative OB ROS                             Anesthesia Physical Anesthesia Plan  ASA: III  Anesthesia Plan: General   Post-op Pain Management:    Induction:   PONV Risk Score and Plan: Propofol infusion  Airway Management Planned:   Additional Equipment:   Intra-op Plan:   Post-operative Plan:   Informed Consent: I have reviewed the patients History and Physical, chart, labs and discussed the procedure including the risks, benefits and alternatives for the proposed anesthesia with the patient or authorized representative who has indicated his/her understanding and acceptance.     Dental Advisory Given  Plan Discussed with: CRNA  Anesthesia Plan Comments:         Anesthesia Quick Evaluation

## 2020-03-24 NOTE — H&P (Addendum)
Sabrina Duke is an 64 y.o. female.   Chief Complaint: Patient is here for esophagogastroduodenoscopy and colonoscopy. HPI: Patient is 64 year old Caucasian female with multiple medical problems including depression and migraine as well as chronic GERD who remains with recurrent nausea without vomiting and poor appetite without weight loss. She feels heartburn is not well controlled with therapy. She is undergoing diagnostic EGD. She is also undergoing colonoscopy primarily for high risk screening. Her last colonoscopy was 6 years ago by Dr. Sabino Dick. His note indicates that she had polyps on prior colonoscopies. Patient does not remember any of  this. She complains of chronic constipation and left-sided abdominal pain. She is felt to have IBS. She denies melena or rectal bleeding. Family history significant for colon carcinoma in maternal and paternal grandmothers. She does not remember their ages at the time of diagnosis.  Past Medical History:  Diagnosis Date  . Allergic rhinitis   . Decreased bone density   . Depression   . GERD (gastroesophageal reflux disease)   . Hypothyroid   . Iron deficiency anemia   . Low vitamin D level   . Migraine     Past Surgical History:  Procedure Laterality Date  . BIOPSY  03/10/2019   Procedure: BIOPSY;  Surgeon: Malissa Hippo, MD;  Location: [facility];  Service: Endoscopy;;  gastric   . ESOPHAGOGASTRODUODENOSCOPY N/A 03/10/2019   Procedure: ESOPHAGOGASTRODUODENOSCOPY (EGD);  Surgeon: Malissa Hippo, MD;  Location: [facility];  Service: Endoscopy;  Laterality: N/A;  2:40-rescheduled 10/5 @11 :55am per office  . hemroidectomy  1990  . OVARIAN CYST SURGERY  1972  . SHOULDER SURGERY Left 11/2017  . TUBAL LIGATION  1997  . uterine polyp removal  05/2015    Family History  Problem Relation Age of Onset  . Anxiety disorder Mother   . Depression Mother   . Bone cancer Mother   . OCD Mother   . Cerebral aneurysm Maternal Uncle   . Drug abuse  Brother   . Depression Brother    Social History:  reports that she has quit smoking. She has never used smokeless tobacco. She reports that she does not drink alcohol and does not use drugs.  Allergies:  Allergies  Allergen Reactions  . Bentyl [Dicyclomine] Other (See Comments)    Patient states it caused leg and arm spasms.  06/2015 [Iodinated Diagnostic Agents] Anaphylaxis  . Antihistamines, Loratadine-Type Other (See Comments)    Sleepy  . Ciprofloxacin Nausea And Vomiting  . Dymista [Azelastine-Fluticasone] Other (See Comments)    Heachache  . Lortab [Hydrocodone-Acetaminophen] Nausea And Vomiting  . Macrobid [Nitrofurantoin] Other (See Comments)    Unknown  . Montelukast Other (See Comments)    Sleep  . Nsaids     Pt can tolerate small amounts of Aleve "mess my stomach up, have had ulcer in the past"  . Nurtec [Rimegepant Sulfate] Nausea Only  . Other     Patient not sure about these allergies  Darvocet-   Mermantin- "makes me sick on my stomach"  Bio Citrate Calcium with Vitamin D- "hurt stomach"     . Pepcid [Famotidine] Other (See Comments)    Headache  . Prednisone     "makes me hyper and gives me a headache"  . Prilosec [Omeprazole] Other (See Comments)    Headache  . Rosuvastatin Calcium Other (See Comments)    Headaches.  Ardine Bjork [Cefpodoxime] Nausea And Vomiting  . Zofran [Ondansetron] Other (See Comments)    Causes  headaches per pt    Medications Prior to Admission  Medication Sig Dispense Refill  . Ascorbic Acid (VITAMIN C) 500 MG/5ML LIQD Take 1,000 mg by mouth daily.    Marland Kitchen azelastine (ASTELIN) 0.1 % nasal spray Place 1 spray into both nostrils daily as needed for rhinitis. Use in each nostril as directed    . buPROPion (WELLBUTRIN SR) 100 MG 12 hr tablet Take 100 mg by mouth daily.    . Cholecalciferol (VITAMIN D) 125 MCG (5000 UT) CAPS Take 5,000 Units by mouth daily.    . Eszopiclone 3 MG TABS Take 1 tablet (3 mg total) by mouth at bedtime.  Take immediately before bedtime 30 tablet 2  . levothyroxine (SYNTHROID) 50 MCG tablet Take 50 mcg by mouth daily before breakfast.    . linaclotide (LINZESS) 72 MCG capsule Take 1 capsule (72 mcg total) by mouth daily. 90 capsule 1  . Magnesium 200 MG TABS Take 200 mg by mouth daily.     . polyethylene glycol (MIRALAX / GLYCOLAX) 17 g packet Take 17 g by mouth daily as needed for moderate constipation.    . rizatriptan (MAXALT) 10 MG tablet Take 10 mg by mouth as needed for migraine. May repeat in 2 hours if needed    . sucralfate (CARAFATE) 1 g tablet Take 1 tablet (1 g total) by mouth 4 (four) times daily as needed. 120 tablet 5  . SUMAtriptan (IMITREX) 100 MG tablet Take 1 tablet (100 mg total) by mouth once as needed for up to 1 dose for migraine. May repeat in 2 hours if headache persists or recurs. 10 tablet 2  . topiramate (TOPAMAX) 50 MG tablet Take 3 tablets (150 mg total) by mouth at bedtime. 270 tablet 2  . topiramate (TOPAMAX) 50 MG tablet Take 3 tablet at bedtime. 30 tablet 0  . Vitamin D, Ergocalciferol, (DRISDOL) 1.25 MG (50000 UNIT) CAPS capsule Take 50,000 Units by mouth once a week.    . WELLBUTRIN XL 150 MG 24 hr tablet Take 1 tablet (150 mg total) by mouth daily. 30 tablet 1  . Zinc Sulfate (ZINC 15 PO) Take 15 mg by mouth daily.    . naratriptan (AMERGE) 2.5 MG tablet Take one tablet in AM and at bedtime for 5 days. 10 tablet 0  . promethazine (PHENERGAN) 25 MG tablet Take 1 tablet (25 mg total) by mouth every 6 (six) hours as needed for up to 10 days for nausea or vomiting. 30 tablet 0    Results for orders placed or performed during the hospital encounter of 03/22/20 (from the past 48 hour(s))  SARS CORONAVIRUS 2 (TAT 6-24 HRS) Nasopharyngeal Nasopharyngeal Swab     Status: None   Collection Time: 03/22/20  2:05 PM   Specimen: Nasopharyngeal Swab  Result Value Ref Range   SARS Coronavirus 2 NEGATIVE NEGATIVE    Comment: (NOTE) SARS-CoV-2 target nucleic acids are NOT  DETECTED.  The SARS-CoV-2 RNA is generally detectable in upper and lower respiratory specimens during the acute phase of infection. Negative results do not preclude SARS-CoV-2 infection, do not rule out co-infections with other pathogens, and should not be used as the sole basis for treatment or other patient management decisions. Negative results must be combined with clinical observations, patient history, and epidemiological information. The expected result is Negative.  Fact Sheet for Patients: HairSlick.no  Fact Sheet for Healthcare Providers: quierodirigir.com  This test is not yet approved or cleared by the [place] FDA and  has been  authorized for detection and/or diagnosis of SARS-CoV-2 by FDA under an Emergency Use Authorization (EUA). This EUA will remain  in effect (meaning this test can be used) for the duration of the COVID-19 declaration under Se ction 564(b)(1) of the Act, 21 U.S.C. section 360bbb-3(b)(1), unless the authorization is terminated or revoked sooner.  Performed at [facility], [address] [place], [place] [address]    No results found.  Review of Systems  Blood pressure 115/72, temperature 97.7 F (36.5 C), temperature source Oral, resp. rate 18, SpO2 100 %. Physical Exam Constitutional:      Comments: Patient is alert. She appears to be in some pain because of migraine. She is keeping her eyes covered with washcloth.  HENT:     Mouth/Throat:     Mouth: Mucous membranes are moist.     Pharynx: Oropharynx is clear.  Eyes:     General: No scleral icterus.    Conjunctiva/sclera: Conjunctivae normal.  Cardiovascular:     Rate and Rhythm: Normal rate and regular rhythm.     Heart sounds: Normal heart sounds. No murmur heard.   Pulmonary:     Effort: Pulmonary effort is normal.     Breath sounds: Normal breath sounds.  Abdominal:     Comments: Abdomen is symmetrical. On  palpation is soft. She has mild tenderness at LLQ and epigastric region. No organomegaly or masses.  Musculoskeletal:        General: No swelling.     Cervical back: Neck supple.  Lymphadenopathy:     Cervical: No cervical adenopathy.  Skin:    General: Skin is warm and dry.      Assessment/Plan  Acute on chronic nausea and poorly controlled GERD symptoms. Chronic abdominal pain and constipation felt to be due to IBS. Family history of CRC in both grandmothers. Diagnostic esophagogastroduodenoscopy and high risk screening colonoscopy  Lionel December, MD 03/24/2020, 11:04 AM

## 2020-03-25 ENCOUNTER — Other Ambulatory Visit: Payer: Self-pay

## 2020-03-25 LAB — SURGICAL PATHOLOGY

## 2020-03-27 ENCOUNTER — Other Ambulatory Visit (INDEPENDENT_AMBULATORY_CARE_PROVIDER_SITE_OTHER): Payer: Self-pay | Admitting: Internal Medicine

## 2020-03-27 DIAGNOSIS — K589 Irritable bowel syndrome without diarrhea: Secondary | ICD-10-CM

## 2020-03-29 ENCOUNTER — Other Ambulatory Visit (INDEPENDENT_AMBULATORY_CARE_PROVIDER_SITE_OTHER): Payer: Self-pay | Admitting: *Deleted

## 2020-03-29 ENCOUNTER — Encounter (HOSPITAL_COMMUNITY): Payer: Self-pay | Admitting: Internal Medicine

## 2020-03-29 DIAGNOSIS — K589 Irritable bowel syndrome without diarrhea: Secondary | ICD-10-CM | POA: Diagnosis not present

## 2020-03-30 ENCOUNTER — Telehealth (INDEPENDENT_AMBULATORY_CARE_PROVIDER_SITE_OTHER): Payer: Self-pay

## 2020-03-30 LAB — CELIAC DISEASE PANEL
(tTG) Ab, IgA: 17.5 U/mL — ABNORMAL HIGH
(tTG) Ab, IgG: <1 U/mL
Gliadin IgA: 245.8 U/mL — ABNORMAL HIGH
Gliadin IgG: 22.9 U/mL — ABNORMAL HIGH
Immunoglobulin A: 106 mg/dL (ref 70–320)

## 2020-03-30 LAB — C-REACTIVE PROTEIN: CRP: 0.5 mg/L (ref <8.0)

## 2020-03-30 LAB — SEDIMENTATION RATE: Sed Rate: 2 mm/h (ref 0–30)

## 2020-03-30 NOTE — Telephone Encounter (Signed)
Patient was called and given the results of Sed Rate and CRP. She was told that the Celiac Panel has not resulted back and once it has either Dr.Rehman or myself would call her back.

## 2020-03-30 NOTE — Telephone Encounter (Signed)
Sabrina Duke is calling asking for her lab results, so she will know exactly what she can eat, please advise?

## 2020-03-31 ENCOUNTER — Telehealth: Payer: Self-pay

## 2020-03-31 NOTE — Telephone Encounter (Signed)
Prior authorization submitted and approved for [facility] XL 150 MG BRAND effective 03/22/2020-03/22/2021 with [employer] PA# [id number]

## 2020-04-02 ENCOUNTER — Ambulatory Visit: Payer: BC Managed Care – PPO | Admitting: Dietician

## 2020-04-12 ENCOUNTER — Encounter (INDEPENDENT_AMBULATORY_CARE_PROVIDER_SITE_OTHER): Payer: Self-pay

## 2020-04-14 ENCOUNTER — Other Ambulatory Visit: Payer: Self-pay | Admitting: Psychiatry

## 2020-04-14 DIAGNOSIS — F4321 Adjustment disorder with depressed mood: Secondary | ICD-10-CM

## 2020-04-14 NOTE — Telephone Encounter (Signed)
Sabrina Duke called to request a refill of the Ativan you prescribed for her when her brother passed.  She is wanting another 10 to help her through the holidays.  She does want you to know that she did start a grief program and it is helping her. Made appt for follow up

## 2020-04-15 ENCOUNTER — Other Ambulatory Visit: Payer: Self-pay | Admitting: Psychiatry

## 2020-04-15 DIAGNOSIS — F331 Major depressive disorder, recurrent, moderate: Secondary | ICD-10-CM

## 2020-04-15 NOTE — Telephone Encounter (Signed)
Please review

## 2020-05-05 ENCOUNTER — Ambulatory Visit: Payer: BC Managed Care – PPO | Admitting: Registered"

## 2020-05-11 ENCOUNTER — Encounter: Payer: BC Managed Care – PPO | Attending: Internal Medicine | Admitting: Nutrition

## 2020-05-11 ENCOUNTER — Other Ambulatory Visit: Payer: Self-pay

## 2020-05-11 ENCOUNTER — Encounter: Payer: Self-pay | Admitting: Nutrition

## 2020-05-11 ENCOUNTER — Ambulatory Visit: Payer: BC Managed Care – PPO | Admitting: Nutrition

## 2020-05-11 VITALS — Ht 64.0 in | Wt 145.0 lb

## 2020-05-11 DIAGNOSIS — K581 Irritable bowel syndrome with constipation: Secondary | ICD-10-CM

## 2020-05-11 NOTE — Progress Notes (Signed)
Medical Nutrition Therapy   Primary concerns today: Celiac/IBS. Just got dx with gluten allery in October 2021. Referral diagnosis: Celiac disease Preferred learning style:  no preference indicated Learning readiness: ready  NUTRITION ASSESSMENT   Anthropometrics  Wt Readings from Last 3 Encounters:  05/11/20 145 lb (65.8 kg)  03/22/20 140 lb (63.5 kg)  03/15/20 144 lb 8 oz (65.5 kg)   Ht Readings from Last 3 Encounters:  05/11/20 5\' 4"  (1.626 m)  03/22/20 5\' 4"  (1.626 m)  03/15/20 5\' 4"  (1.626 m)   Body mass index is 24.89 kg/m. @BMIFA @ Facility age limit for growth percentiles is 20 years. Facility age limit for growth percentiles is 20 years.    Clinical Medical Hx: Depression, Thyroid issues, Migraines, Vit D deficiency,     Medications: see chart Labs:  CMP Latest Ref Rng & Units 03/08/2020 01/20/2020 01/19/2020  Glucose 70 - 99 mg/dL 97 ) 91  BUN 8 - 23 mg/dL 17 14 12   Creatinine 0.44 - 1.00 mg/dL 05/08/2020 01/22/2020) 01/21/2020)  Sodium 135 - 145 mmol/L 139 140 141  Potassium 3.5 - 5.1 mmol/L 4.0 3.8 3.4(L)  Chloride 98 - 111 mmol/L 109 113(H) 114(H)  CO2 22 - 32 mmol/L 22 19(L) 20(L)  Calcium 8.9 - 10.3 mg/dL 9.3 176(H) )  Total Protein 6.5 - 8.1 g/dL 6.7 - -  Total Bilirubin 0.3 - 1.2 mg/dL 0.5 - -  Alkaline Phos 38 - 126 U/L 78 - -  AST 15 - 41 U/L 20 - -  ALT 0 - 44 U/L 14 - -   Lipid Panel  No results found for: CHOL, TRIG, HDL, CHOLHDL, VLDL, LDLCALC, LDLDIRECT, LABVLDL   Notable Signs/Symptoms: GI issues with diarrhea, bloating, constipation,  Lifestyle & Dietary Hx   Estimated daily fluid intake: 45-60 oz  Supplements: VIt D, Zinc. MG,  Sleep: 6-8 hrs Stress / self-care: has some stress in her life Current average weekly physical activity:   BM once a week. Tends to be more constipated lately with bloating.  24-Hr Dietary Recall Meals inconsistent  Estimated Energy Needs Calories: 1500-1800  Carbohydrate: 170g Protein: 112g Fat:  42g   NUTRITION DIAGNOSIS  Nutrient intolerated related to gluten senstitivity as evidenced by Celiac disease   NUTRITION INTERVENTION  Nutrition education (E-1) on the following topics:   Low Gluten Diet Low Residue Diet   Reading food labels     Goals Follow Gluten free diet Read food labels. Drink 64 oz of water per day Walk 30 minutes a day Drink hot peppermint tea daily. Keep food journal and symptom log.  Handouts Provided Include   Diet for Celiac   Learning Style & Readiness for Change Teaching method utilized: Visual & Auditory  Demonstrated degree of understanding via: Teach Back  Barriers to learning/adherence to lifestyle change: none  Goals Established by Pt  Reduce gluten in diet   MONITORING & EVALUATION Dietary intake, weekly physical activity, and follow up in 1-2 months.6.07

## 2020-05-11 NOTE — Patient Instructions (Addendum)
   Goal Follow Gluten free diet Read food labels. Drink 64 oz of water per day Walk 30 minutes a day Drink hot peppermint tea daily. Keep food journal and symptom log.

## 2020-05-13 ENCOUNTER — Encounter (INDEPENDENT_AMBULATORY_CARE_PROVIDER_SITE_OTHER): Payer: Self-pay | Admitting: *Deleted

## 2020-05-16 ENCOUNTER — Other Ambulatory Visit: Payer: Self-pay | Admitting: Psychiatry

## 2020-05-16 DIAGNOSIS — F331 Major depressive disorder, recurrent, moderate: Secondary | ICD-10-CM

## 2020-05-20 ENCOUNTER — Telehealth: Payer: BC Managed Care – PPO | Admitting: Psychiatry

## 2020-05-21 ENCOUNTER — Telehealth: Payer: Self-pay | Admitting: Psychiatry

## 2020-05-21 DIAGNOSIS — F331 Major depressive disorder, recurrent, moderate: Secondary | ICD-10-CM

## 2020-05-21 DIAGNOSIS — F5101 Primary insomnia: Secondary | ICD-10-CM

## 2020-05-21 MED ORDER — ESZOPICLONE 3 MG PO TABS: 3.0000 mg | ORAL_TABLET | Freq: Every day | ORAL | 2 refills | Status: DC

## 2020-05-21 MED ORDER — BUPROPION HCL ER (XL) 150 MG PO TB24: 150.0000 mg | ORAL_TABLET | Freq: Every day | ORAL | 0 refills | Status: DC

## 2020-05-21 NOTE — Telephone Encounter (Signed)
Pt LM on VM requesting Rx for Wellburion. No refills. Also, Alfonso Patten is due for refill. Pt Pharmacy closed 12/24 & 12/25. Requesting early refill on Lunesta for 12/23. Contact # [phone]. apt 2/7  [facility]

## 2020-05-21 NOTE — Telephone Encounter (Signed)
Script sent for Lunesta with 05/27/20 fill date. Script also sent for Wellbutrin XL

## 2020-06-01 ENCOUNTER — Telehealth (INDEPENDENT_AMBULATORY_CARE_PROVIDER_SITE_OTHER): Payer: Self-pay | Admitting: Gastroenterology

## 2020-06-01 NOTE — Telephone Encounter (Signed)
I talked with the patient. She shares that she had taken the linzess only for a few days. She did have good results. However she does like the way it made her feel. She felt fatigued and nauseated. She tells me that she will not restart this medication.  She remain nauseated today, pain in the left quadrant. She says that she was feeling okay until she ate yesterday.I ask what did she eat yesterday. She had oatmeal for breakfast, ham sandwich with tomato on gluten free bread, some gluten free chips, a bowl of gluten free cereal . She drank grape ,apple juices and is drinking regular water 60 oz per day per the dietician.  I ask when her last BM was and she said yesterday. The pain is the same as it was when she was in the hospital not as severe. On a scale of 1- 10 she is a 5.  I ask if she was using the Miralax 17 grams and she said that she was not. Then she said that she would restart this. Patient advised that this would be shared with Dr.Rehman and any further recommendations we would make her aware. It symptoms worsened she should go to the ED for further evaluation or she may contact her PCP.

## 2020-06-01 NOTE — Telephone Encounter (Signed)
Yes she can discontinue Linzess and go back on polyethylene glycol but take it every day. You should also keep some Dulcolax suppositories which she can use if she goes 2 days without a bowel movement.

## 2020-06-01 NOTE — Telephone Encounter (Signed)
Patient called the office stating she has been having right side pain - told patient she has an appointment on 06/14/20 advised patient that if we have a cancellation we will call her - patient wanted to know what she was supposed to do - advised patient she could go to ED or call her PCP - patient stated her PCP did not have anything to do with what was going on with her - please advise - ph# [phone]

## 2020-06-02 NOTE — Telephone Encounter (Signed)
Patient was called and a message was left on her voicemail with Dr.Rehman's recommendations. 

## 2020-06-09 ENCOUNTER — Other Ambulatory Visit: Payer: Self-pay | Admitting: Psychiatry

## 2020-06-14 ENCOUNTER — Ambulatory Visit (INDEPENDENT_AMBULATORY_CARE_PROVIDER_SITE_OTHER): Payer: BC Managed Care – PPO | Admitting: Gastroenterology

## 2020-06-14 ENCOUNTER — Other Ambulatory Visit: Payer: Self-pay

## 2020-06-14 ENCOUNTER — Encounter (INDEPENDENT_AMBULATORY_CARE_PROVIDER_SITE_OTHER): Payer: Self-pay | Admitting: Gastroenterology

## 2020-06-14 DIAGNOSIS — K900 Celiac disease: Secondary | ICD-10-CM | POA: Insufficient documentation

## 2020-06-14 DIAGNOSIS — R1032 Left lower quadrant pain: Secondary | ICD-10-CM

## 2020-06-14 DIAGNOSIS — K5900 Constipation, unspecified: Secondary | ICD-10-CM

## 2020-06-14 MED ORDER — HYOSCYAMINE SULFATE 0.125 MG SL SUBL: 0.1250 mg | SUBLINGUAL_TABLET | Freq: Four times a day (QID) | SUBLINGUAL | 1 refills | Status: DC | PRN

## 2020-06-14 NOTE — Patient Instructions (Signed)
Start taking Miralax 1 cup every 8 hours Perform blood workup Schedule breath test Start Levsin as needed for abdominal pain

## 2020-06-14 NOTE — Progress Notes (Addendum)
Maylon Peppers, M.D. Gastroenterology & Hepatology [facility] [address] [place], [place] [address]  Primary Care Physician: Bretta Bang, MD [address] [place] [address]  I will communicate my assessment and recommendations to the referring MD via EMR.  Problems: 1. Celiac disease 2. LLQ Abdominal pain 3. GERD  History of Present Illness: Sabrina Duke is a 65 y.o. female with PMH celiac disease, GERD, depression, hypothyroidism, iron deficiency anemia and migraines, who presents for follow up of celiac disease, abdominal pain and constipation.  The patient was last seen on 03/15/2020. At that time, the patient was suspected to have IBS-C, for which she was counseled to implement the low FODMAP diet, started Linzess 72 mcg every day and IBgard as needed.   Patient had a previous appointment to perform EGD and colonoscopy which she performed on 03/24/2020 with the following findings Last EGD: 03/2020 - gastritis (Bx neg for HP), normal duodenum with presence of intraepithelial lymphocytosis. Due to this Celiac panel ordered on 03/29/2020 consistent with celiac disease. CRP and ESR WNL. Last Colonoscopy: 03/2020 - normal colon, repeat in 5 years  Patient went to dietitian around November as recommended and has been compliant with the gluten free diet. The patient reports that even though she has been compliant with the diet, however she has presented recurrent episodes of LLQ abdominal pain which has been very uncomfortable. States that since she started the gluten free the diffuse pain in her abdomen is less frequent but now has the pain only located in this specific side. She is not currently taking any medication for the pain. She reports that after eating things like steak or big meals she will present the LLQ pain. She has to eat smaller portions to avoid symptoms. She states she tries to take fiber in her diet. She  did not tolerate her Linzess as it "made her feel funny". She is having 3 BMs per day on average. Feels very bloated through the day. She was taking Miralax two times per day, initially worked but now it has not. States feeling nauseated but no vomiting. Very seldom has some loose bowel movements.  Per review of her medical chart, she has lost 2 lb in the last 3 months.  The patient denies having any vomiting, fever, chills, hematochezia, melena, hematemesis, diarrhea, jaundice, pruritus.   Past Medical History: Past Medical History:  Diagnosis Date  . Allergic rhinitis   . Decreased bone density   . Depression   . GERD (gastroesophageal reflux disease)   . Hypothyroid   . Iron deficiency anemia   . Low vitamin D level   . Migraine     Past Surgical History: Past Surgical History:  Procedure Laterality Date  . BIOPSY  03/10/2019   Procedure: BIOPSY;  Surgeon: Rogene Houston, MD;  [facility];  Service: Endoscopy;;  gastric   . BIOPSY  03/24/2020   Procedure: BIOPSY;  Surgeon: Rogene Houston, MD;  [facility];  Service: Endoscopy;;  duodenum gastric  . COLONOSCOPY WITH PROPOFOL N/A 03/24/2020   Procedure: COLONOSCOPY WITH PROPOFOL;  Surgeon: Rogene Houston, MD;  [facility];  Service: Endoscopy;  Laterality: N/A;  1225  . ESOPHAGOGASTRODUODENOSCOPY N/A 03/10/2019   Procedure: ESOPHAGOGASTRODUODENOSCOPY (EGD);  Surgeon: Rogene Houston, MD;  [facility];  Service: Endoscopy;  Laterality: N/A;  2:40-rescheduled 10/5 @11 :55am per office  . ESOPHAGOGASTRODUODENOSCOPY (EGD) WITH PROPOFOL N/A 03/24/2020   Procedure:  ESOPHAGOGASTRODUODENOSCOPY (EGD) WITH PROPOFOL;  Surgeon: Rogene Houston, MD;  [facility];  Service: Endoscopy;  Laterality: N/A;  . hemroidectomy  1990  . OVARIAN CYST SURGERY  1972  . SHOULDER SURGERY Left 11/2017  . TUBAL LIGATION  1997  . uterine polyp removal  05/2015    Family History: Family  History  Problem Relation Age of Onset  . Anxiety disorder Mother   . Depression Mother   . Bone cancer Mother   . OCD Mother   . Cerebral aneurysm Maternal Uncle   . Drug abuse Brother   . Depression Brother     Social History: Social History   Tobacco Use  Smoking Status Former Smoker  Smokeless Tobacco Never Used   Social History   Substance and Sexual Activity  Alcohol Use No   Social History   Substance and Sexual Activity  Drug Use No    Allergies: Allergies  Allergen Reactions  . Bentyl [Dicyclomine] Other (See Comments)    Patient states it caused leg and arm spasms.  Clementeen Hoof [Iodinated Diagnostic Agents] Anaphylaxis  . Antihistamines, Loratadine-Type Other (See Comments)    Sleepy  . Ciprofloxacin Nausea And Vomiting  . Dymista [Azelastine-Fluticasone] Other (See Comments)    Heachache  . Lortab [Hydrocodone-Acetaminophen] Nausea And Vomiting  . Macrobid [Nitrofurantoin] Other (See Comments)    Unknown  . Montelukast Other (See Comments)    Sleep  . Nsaids     Pt can tolerate small amounts of Aleve "mess my stomach up, have had ulcer in the past"  . Nurtec [Rimegepant Sulfate] Nausea Only  . Other     Patient not sure about these allergies  Darvocet-   Mermantin- "makes me sick on my stomach"  Bio Citrate Calcium with Vitamin D- "hurt stomach"     . Pepcid [Famotidine] Other (See Comments)    Headache  . Prednisone     "makes me hyper and gives me a headache"  . Prilosec [Omeprazole] Other (See Comments)    Headache  . Rosuvastatin Calcium Other (See Comments)    Headaches.  Bennie Pierini [Cefpodoxime] Nausea And Vomiting  . Zofran [Ondansetron] Other (See Comments)    Causes headaches per pt    Medications: Current Outpatient Medications  Medication Sig Dispense Refill  . Ascorbic Acid (VITAMIN C) 500 MG/5ML LIQD Take 1,000 mg by mouth daily.    Marland Kitchen azelastine (ASTELIN) 0.1 % nasal spray Place 1 spray into both nostrils daily as needed  for rhinitis. Use in each nostril as directed    . buPROPion (WELLBUTRIN SR) 100 MG 12 hr tablet TAKE 1 TABLET BY MOUTH TWICE A DAY. (Patient taking differently: Take 100 mg by mouth daily.) 60 tablet 0  . buPROPion (WELLBUTRIN XL) 150 MG 24 hr tablet Take 1 tablet (150 mg total) by mouth daily. 30 tablet 0  . Eszopiclone 3 MG TABS Take 1 tablet (3 mg total) by mouth at bedtime. Take immediately before bedtime 30 tablet 2  . levothyroxine (SYNTHROID) 50 MCG tablet Take 50 mcg by mouth daily before breakfast.    . Magnesium 200 MG TABS Take 200 mg by mouth daily.     . naratriptan (AMERGE) 2.5 MG tablet Take one tablet in AM and at bedtime for 5 days. 10 tablet 0  . promethazine (PHENERGAN) 25 MG tablet Take 1 tablet (25 mg total) by mouth every 6 (six) hours as needed for up to 10 days for nausea or vomiting. 30 tablet 0  .  SUMAtriptan (IMITREX) 100 MG tablet Take 1 tablet (100 mg total) by mouth once as needed for up to 1 dose for migraine. May repeat in 2 hours if headache persists or recurs. 10 tablet 2  . topiramate (TOPAMAX) 50 MG tablet Take 3 tablets (150 mg total) by mouth at bedtime. 270 tablet 2  . Vitamin D, Ergocalciferol, (DRISDOL) 1.25 MG (50000 UNIT) CAPS capsule Take 50,000 Units by mouth once a week.    . linaclotide (LINZESS) 72 MCG capsule Take 1 capsule (72 mcg total) by mouth daily. (Patient not taking: Reported on 06/14/2020) 90 capsule 1  . LORazepam (ATIVAN) 0.5 MG tablet TAKE 1/2 TO ONE TABLET BY MOUTH EVERY 6 HOURS AS NEEDED FOR ANXIETY(DO NOT TAKE WITH LUNESTA) (Patient not taking: Reported on 06/14/2020) 10 tablet 0  . polyethylene glycol (MIRALAX / GLYCOLAX) 17 g packet Take 17 g by mouth daily as needed for moderate constipation. (Patient not taking: Reported on 06/14/2020)    . RABEprazole (ACIPHEX) 20 MG tablet Take 1 tablet (20 mg total) by mouth daily before breakfast. (Patient not taking: Reported on 06/14/2020) 30 tablet 5  . sucralfate (CARAFATE) 1 g tablet Take 1  tablet (1 g total) by mouth 4 (four) times daily as needed. (Patient not taking: Reported on 06/14/2020) 120 tablet 5  . Zinc Sulfate (ZINC 15 PO) Take 15 mg by mouth daily. (Patient not taking: Reported on 06/14/2020)     No current facility-administered medications for this visit.    Review of Systems: GENERAL: negative for malaise, night sweats HEENT: No changes in hearing or vision, no nose bleeds or other nasal problems. NECK: Negative for lumps, goiter, pain and significant neck swelling RESPIRATORY: Negative for cough, wheezing CARDIOVASCULAR: Negative for chest pain, leg swelling, palpitations, orthopnea GI: SEE HPI MUSCULOSKELETAL: Negative for joint pain or swelling, back pain, and muscle pain. SKIN: Negative for lesions, rash PSYCH: Negative for sleep disturbance, mood disorder and recent psychosocial stressors. HEMATOLOGY Negative for prolonged bleeding, bruising easily, and swollen nodes. ENDOCRINE: Negative for cold or heat intolerance, polyuria, polydipsia and goiter. NEURO: negative for tremor, gait imbalance, syncope and seizures. The remainder of the review of systems is noncontributory.   Physical Exam: BP 95/64 (BP Location: Left Arm, Patient Position: Sitting, Cuff Size: Large)   Pulse 75   Temp 98.1 F (36.7 C) (Oral)   Ht 5\' 4"  (1.626 m)   Wt 143 lb (64.9 kg)   BMI 24.55 kg/m  GENERAL: The patient is AO x3, in no acute distress. HEENT: Head is normocephalic and atraumatic. EOMI are intact. Mouth is well hydrated and without lesions. NECK: Supple. No masses LUNGS: Clear to auscultation. No presence of rhonchi/wheezing/rales. Adequate chest expansion HEART: RRR, normal s1 and s2. ABDOMEN: tender upon palpation of the  LLQ, no guarding, no peritoneal signs, and nondistended. BS +. No masses. EXTREMITIES: Without any cyanosis, clubbing, rash, lesions or edema. NEUROLOGIC: AOx3, no focal motor deficit. SKIN: no jaundice, no rashes  Imaging/Labs: as above  I  personally reviewed and interpreted the available labs, imaging and endoscopic files.  Impression and Plan: Sabrina Duke is a 65 y.o. female with PMH celiac disease, GERD, depression, hypothyroidism, iron deficiency anemia and migraines, who presents for follow up of celiac disease, abdominal pain and constipation.  Patient has recent diagnosis of celiac disease, for which she has tried to implement dietary changes compliantly based on recommendations by dietitian.  However, she has presented symptoms of pain, bloating and constipation.  I consider at  this moment it would be important to rule out inadvertent intake of gluten through cross-contamination by checking her celiac serologies as she has been compliant with her diet for the last month and a half.  If this comes back positive, evaluation of her diet should be performed.  Other etiologies of her current symptoms include possible SIBO, for which we will refer her to wait for this hospital for breath test.  Finally, other potential etiologies include IBS-C, I advised the patient to start the MiraLAX 1 cap every 8 hours, if this is not helpful then I will consider starting her on Amitiza.  She can take Levsin as needed for abdominal pain for now.  - Continue gluten free diet - Start taking Miralax 1 cup every 8 hours - check celiac serologies - Schedule SIBO breath test - Start Levsin as needed every 8 h for abdominal pain   All questions were answered.      Harvel Quale, MD Gastroenterology and Hepatology [facility]

## 2020-06-15 ENCOUNTER — Telehealth: Payer: Self-pay | Admitting: Adult Health

## 2020-06-15 ENCOUNTER — Telehealth (INDEPENDENT_AMBULATORY_CARE_PROVIDER_SITE_OTHER): Payer: BC Managed Care – PPO | Admitting: Family Medicine

## 2020-06-15 ENCOUNTER — Encounter: Payer: Self-pay | Admitting: Family Medicine

## 2020-06-15 DIAGNOSIS — G43709 Chronic migraine without aura, not intractable, without status migrainosus: Secondary | ICD-10-CM | POA: Diagnosis not present

## 2020-06-15 MED ORDER — SUMATRIPTAN SUCCINATE 100 MG PO TABS: 100.0000 mg | ORAL_TABLET | Freq: Once | ORAL | 11 refills | Status: DC | PRN

## 2020-06-15 MED ORDER — TOPIRAMATE 50 MG PO TABS: 150.0000 mg | ORAL_TABLET | Freq: Every day | ORAL | 3 refills | Status: DC

## 2020-06-15 NOTE — Telephone Encounter (Signed)
Noted migraines.  

## 2020-06-15 NOTE — Progress Notes (Addendum)
PATIENT: Sabrina Duke DOB: 1955-09-15  REASON FOR VISIT: follow up HISTORY FROM: patient  Virtual Visit via Telephone Note  I connected with Sabrina Duke on 06/15/20 at  2:00 PM EST by telephone and verified that I am speaking with the correct person using two identifiers.   I discussed the limitations, risks, security and privacy concerns of performing an evaluation and management service by telephone and the availability of in person appointments. I also discussed with the patient that there may be a patient responsible charge related to this service. The patient expressed understanding and agreed to proceed.   History of Present Illness:  06/15/20 Sabrina Duke is a 65 y.o. female here today for follow up for migraines. She was last seen by [facility] in 09/2019. She continues topiramate 150mg  at bedtime and sumatriptan for abortive therapy. She was unable to tolerate Amovig due to constipation. Emgality or Ajovy was not started do to fears of side effects.   She reports that headaches have improved over the past 3-4 months. She was diagnosed with Celiac disease and has made significant diet changes. She does not eat gluten. She has about 8 headache days per month. Usually easily aborted with sumatriptan. She is no longer needing a full prescription of sumatriptan. She has had difficulty with constipation and is followed closely by GI.   Tried and failed: Tpoiramate (on now), Inderal, gabapentin, nortriptyline, Depakote, Maxalt, Emerge, Botox, Amovig (constipation), sumatriptan (on now), Nurtec (nausea)   History (copied from previous notes)  Today 09/09/19: Sabrina Duke is a 65 year old female with a history of migraine headaches.  She returns today for follow-up.  She reports that she never tried Ajovy.  Reports in the last month she has had 15 headaches.  She states prior to she was having 7-8 headaches a month.  She states that sumatriptan normally resolves her headache in about 30  minutes.  Her headaches always occur on the left side.  She does have photophobia, phonophobia, nausea and vomiting.  She has Phenergan for nausea.  The patient reports that she never tried Ajovy because she was worried about the potential side effects.  She has continued on Topamax 150 mg daily.  Tried and failed: Inderal, gabapentin, nortriptyline, Depakote, Maxalt, Emerge, Botox  HISTORY 02/06/19 ALL Sabrina Duke a 65 y.o.femalehere today for follow up for migraines.She continues to have regular headaches with migrainous features of pounding, nausea, light and sound sensitivity. She has been seen 4 times this month for acute management. She was started on Amovig at last visit in 11/2018. She was not able to tolerate this due to increased constipation. She did not feel that it helped headache. She continues topiramate 150mg  at bedtime. She has been unable to tolerate Inderal, gabapentin, nortriptyline, Depakote, and Imitrex in the past. She has used Maxalt and Emerge for abortive therapy but is not sure they help. She also uses phenergan and compazine for nausea as needed   Observations/Objective:  Generalized: Well developed, in no acute distress  Mentation: Alert oriented to time, place, history taking. Follows all commands speech and language fluent   Assessment and Plan:  65 y.o. year old female  has a past medical history of Allergic rhinitis, Decreased bone density, Depression, GERD (gastroesophageal reflux disease), Hypothyroid, Iron deficiency anemia, Low vitamin D level, and Migraine. here with    ICD-10-CM   1. Chronic migraine without aura without status migrainosus, not intractable  G43.709      She reports that  migraines have significantly improved over the past 3 to 4 months following a change in her diet due to celiac diagnosis.  She was encouraged to continue avoidance of gluten.  We will continue topiramate and sumatriptan as prescribed.  Healthy lifestyle habits  encouraged.  She will continue close follow-up with GI for concerns of constipation.  She will follow-up with me in 1 year, sooner if needed.  She verbalizes understanding and agreement with this plan.  No orders of the defined types were placed in this encounter.   Meds ordered this encounter  Medications  . topiramate (TOPAMAX) 50 MG tablet    Sig: Take 3 tablets (150 mg total) by mouth at bedtime.    Dispense:  270 tablet    Refill:  3    Order Specific Question:   Supervising Provider    Answer:   Anson Fret [id number]  . SUMAtriptan (IMITREX) 100 MG tablet    Sig: Take 1 tablet (100 mg total) by mouth once as needed for up to 1 dose for migraine. May repeat in 2 hours if headache persists or recurs.    Dispense:  10 tablet    Refill:  11    Order Specific Question:   Supervising Provider    Answer:   Anson Fret [[id number]]     Follow Up Instructions:  I discussed the assessment and treatment plan with the patient. The patient was provided an opportunity to ask questions and all were answered. The patient agreed with the plan and demonstrated an understanding of the instructions.   The patient was advised to call back or seek an in-person evaluation if the symptoms worsen or if the condition fails to improve as anticipated.  I provided 15 minutes of non-face-to-face time during this encounter.  Patient was located at her place of residence during my chart visit.  Provider is in the office.   Shawnie Dapper, NP   Made any corrections needed, and agree with history, physical, neuro exam,assessment and plan as stated.     Naomie Dean, MD [facility]

## 2020-06-15 NOTE — Telephone Encounter (Signed)
..   Pt understands that although there may be some limitations with this type of visit, we will take all precautions to reduce any security or privacy concerns.  Pt understands that this will be treated like an in office visit and we will file with pt's insurance, and there may be a patient responsible charge related to this service. ? ?

## 2020-06-16 ENCOUNTER — Telehealth (INDEPENDENT_AMBULATORY_CARE_PROVIDER_SITE_OTHER): Payer: Self-pay | Admitting: *Deleted

## 2020-06-16 DIAGNOSIS — K581 Irritable bowel syndrome with constipation: Secondary | ICD-10-CM

## 2020-06-16 MED ORDER — LUBIPROSTONE 8 MCG PO CAPS: 8.0000 ug | ORAL_CAPSULE | Freq: Two times a day (BID) | ORAL | 2 refills | Status: DC

## 2020-06-16 NOTE — Telephone Encounter (Signed)
I told to the patient today, she reported that she was presenting persistent constipation for the last 6 days.  States her left side of the abdomen has been hurting.  He has not noticed any improvement despite taking MiraLAX multiple times per day.  She is not currently on Linzess in the past, I recommended her to start taking Amitiza 8 mg twice a day and we will readdress for symptom improvement.

## 2020-06-16 NOTE — Addendum Note (Signed)
Addended by: Dolores Frame on: 06/16/2020 04:01 PM   Modules accepted: Orders

## 2020-06-16 NOTE — Telephone Encounter (Signed)
has not been to bathroom in 6 days, taken miralax 7 times, left side hurting - what does she need to do - please call [phone]

## 2020-06-21 ENCOUNTER — Ambulatory Visit: Payer: BC Managed Care – PPO | Admitting: Nutrition

## 2020-06-28 ENCOUNTER — Telehealth (INDEPENDENT_AMBULATORY_CARE_PROVIDER_SITE_OTHER): Payer: Self-pay | Admitting: Gastroenterology

## 2020-06-28 NOTE — Telephone Encounter (Signed)
Patient left voice mail message stating she is to go to [facility] on 2/1 - states they are saying Dr Levon Hedger needs to order a Covid test - please advise - ph# [phone]

## 2020-06-28 NOTE — Telephone Encounter (Signed)
Per [facility] had requested that she have a covid test either tomorrow or Wednesday at [facility]. I spoke with Dewayne Hatch here in the office she states we are only able to order covid for a procedure that will be done at a [facility] facility. Patient aware to call [facility] back and see if they can run the test or see if pcp or local [facility] can do it.

## 2020-06-30 ENCOUNTER — Telehealth (INDEPENDENT_AMBULATORY_CARE_PROVIDER_SITE_OTHER): Payer: Self-pay | Admitting: *Deleted

## 2020-06-30 NOTE — Telephone Encounter (Signed)
rec'd notification from [facility] - patient has been schedule twice for bacterial overgrowth test and has canceled both appts

## 2020-07-04 NOTE — Telephone Encounter (Signed)
Thanks

## 2020-07-12 ENCOUNTER — Telehealth (INDEPENDENT_AMBULATORY_CARE_PROVIDER_SITE_OTHER): Payer: BC Managed Care – PPO | Admitting: Psychiatry

## 2020-07-12 ENCOUNTER — Encounter: Payer: Self-pay | Admitting: Psychiatry

## 2020-07-12 DIAGNOSIS — F331 Major depressive disorder, recurrent, moderate: Secondary | ICD-10-CM | POA: Diagnosis not present

## 2020-07-12 DIAGNOSIS — F5101 Primary insomnia: Secondary | ICD-10-CM

## 2020-07-12 MED ORDER — BUPROPION HCL ER (SR) 100 MG PO TB12: 100.0000 mg | ORAL_TABLET | Freq: Every day | ORAL | 1 refills | Status: DC

## 2020-07-12 MED ORDER — ESZOPICLONE 3 MG PO TABS: 3.0000 mg | ORAL_TABLET | Freq: Every day | ORAL | 5 refills | Status: DC

## 2020-07-12 MED ORDER — BUPROPION HCL ER (XL) 150 MG PO TB24: 150.0000 mg | ORAL_TABLET | Freq: Every day | ORAL | 1 refills | Status: DC

## 2020-07-12 NOTE — Progress Notes (Signed)
Sabrina Duke [id number] March 23, 1956 65 y.o.  Virtual Visit via Video Note  I connected with pt @ on 07/12/20 at 12:45 PM EST by a video enabled telemedicine application and verified that I am speaking with the correct person using two identifiers.   I discussed the limitations of evaluation and management by telemedicine and the availability of in person appointments. The patient expressed understanding and agreed to proceed.  I discussed the assessment and treatment plan with the patient. The patient was provided an opportunity to ask questions and all were answered. The patient agreed with the plan and demonstrated an understanding of the instructions.   The patient was advised to call back or seek an in-person evaluation if the symptoms worsen or if the condition fails to improve as anticipated.  I provided 30 minutes of non-face-to-face time during this encounter.  The patient was located at home.  The provider was located at [facility].   Corie Chiquito, PMHNP   Subjective:   Patient ID:  Sabrina Duke is a 65 y.o. (DOB 26-Aug-1955) female.  Chief Complaint:  Chief Complaint  Patient presents with  . Follow-up    Depression, Anxiety, and insomnia    HPI Sabrina Duke presents for follow-up of depression, insomnia, and anxiety. She reports "I've been doing good as far as psychologically." She has been dx'd with Celiac and started Celiac diet. She reports that this has helped with her headaches until recent weather changes. She reports that improvement in headaches and health issues has been helpful for her mood. Denies depressed mood. Denies irritability. She reports that she current combination of medications seems to be most effective and well tolerated. Denies any persistent anxiety. Occasionally has anxiety with grief and will use Ativan prn on rare occasions when she is tearful. She reports that she tries to stay busy and this is helpful for her mood and anxiety. She  reports energy and motivation have been good. Sleeping well. She reports that her appetite has been "the same." Concentration has been ok. Denies SI.   Taking Wellbutrin XL 150 mg po q am and Wellbutrin SR 100 mg at 2 pm.   Mother has bone cancer and has had some recent decline.   Past medication trials: Wellbutrin- Effective, well tolerated Trintellix-GI side effects Pristiq- somewhat helpful Lexapro Celexa Prozac- Helped, caused headaches Cymbalta Rexulti Vraylar-jittery, hallucinations Latuda-joint aches Abilify-weight gain Lamictal BuSpar- Weight gain Topamax Trazodone-excessive somnolence Lunesta Ambien-ineffective Adderall XR Ativan-Effective  Review of Systems:  Review of Systems  Musculoskeletal: Negative for gait problem.  Neurological: Positive for headaches. Negative for tremors.  Psychiatric/Behavioral:       Please refer to HPI    Medications: I have reviewed the patient's current medications.  Current Outpatient Medications  Medication Sig Dispense Refill  . levothyroxine (SYNTHROID) 50 MCG tablet Take 50 mcg by mouth daily before breakfast.    . Magnesium 200 MG TABS Take 200 mg by mouth daily.     . polyethylene glycol (MIRALAX / GLYCOLAX) 17 g packet Take 17 g by mouth daily as needed for moderate constipation.    . SUMAtriptan (IMITREX) 100 MG tablet Take 1 tablet (100 mg total) by mouth once as needed for up to 1 dose for migraine. May repeat in 2 hours if headache persists or recurs. 10 tablet 11  . topiramate (TOPAMAX) 50 MG tablet Take 3 tablets (150 mg total) by mouth at bedtime. 270 tablet 3  . Vitamin D, Ergocalciferol, (DRISDOL) 1.25 MG (50000 UNIT)  CAPS capsule Take 50,000 Units by mouth once a week.    Marland Kitchen buPROPion (WELLBUTRIN SR) 100 MG 12 hr tablet Take 1 tablet (100 mg total) by mouth daily in the afternoon. 90 tablet 1  . buPROPion (WELLBUTRIN XL) 150 MG 24 hr tablet Take 1 tablet (150 mg total) by mouth daily. 90 tablet 1  . [START ON  08/19/2020] Eszopiclone 3 MG TABS Take 1 tablet (3 mg total) by mouth at bedtime. Take immediately before bedtime 30 tablet 5  . hyoscyamine (LEVSIN SL) 0.125 MG SL tablet Place 1 tablet (0.125 mg total) under the tongue every 6 (six) hours as needed for cramping. (Patient not taking: Reported on 07/12/2020) 30 tablet 1  . LORazepam (ATIVAN) 0.5 MG tablet TAKE 1/2 TO ONE TABLET BY MOUTH EVERY 6 HOURS AS NEEDED FOR ANXIETY(DO NOT TAKE WITH LUNESTA) (Patient not taking: Reported on 06/14/2020) 10 tablet 0  . lubiprostone (AMITIZA) 8 MCG capsule Take 1 capsule (8 mcg total) by mouth 2 (two) times daily with a meal. (Patient not taking: Reported on 07/12/2020) 60 capsule 2  . naratriptan (AMERGE) 2.5 MG tablet Take one tablet in AM and at bedtime for 5 days. 10 tablet 0  . promethazine (PHENERGAN) 25 MG tablet Take 1 tablet (25 mg total) by mouth every 6 (six) hours as needed for up to 10 days for nausea or vomiting. 30 tablet 0  . RABEprazole (ACIPHEX) 20 MG tablet Take 1 tablet (20 mg total) by mouth daily before breakfast. (Patient not taking: No sig reported) 30 tablet 5  . sucralfate (CARAFATE) 1 g tablet Take 1 tablet (1 g total) by mouth 4 (four) times daily as needed. (Patient not taking: No sig reported) 120 tablet 5   No current facility-administered medications for this visit.    Medication Side Effects: None  Allergies:  Allergies  Allergen Reactions  . Bentyl [Dicyclomine] Other (See Comments)    Patient states it caused leg and arm spasms.  Ardine Bjork [Iodinated Diagnostic Agents] Anaphylaxis  . Antihistamines, Loratadine-Type Other (See Comments)    Sleepy  . Ciprofloxacin Nausea And Vomiting  . Dymista [Azelastine-Fluticasone] Other (See Comments)    [place]  . Gluten Meal   . Lortab [Hydrocodone-Acetaminophen] Nausea And Vomiting  . Macrobid [Nitrofurantoin] Other (See Comments)    Unknown  . Montelukast Other (See Comments)    Sleep  . Nsaids     Pt can tolerate small amounts  of Aleve "mess my stomach up, have had ulcer in the past"  . Nurtec [Rimegepant Sulfate] Nausea Only  . Other     Patient not sure about these allergies  Darvocet-   Mermantin- "makes me sick on my stomach"  Bio Citrate Calcium with Vitamin D- "hurt stomach"     . Pepcid [Famotidine] Other (See Comments)    Headache  . Prednisone     "makes me hyper and gives me a headache"  . Prilosec [Omeprazole] Other (See Comments)    Headache  . Rosuvastatin Calcium Other (See Comments)    Headaches.  Varney Baas [Cefpodoxime] Nausea And Vomiting  . Zofran [Ondansetron] Other (See Comments)    Causes headaches per pt    Past Medical History:  Diagnosis Date  . Allergic rhinitis   . Decreased bone density   . Depression   . GERD (gastroesophageal reflux disease)   . Hypothyroid   . Iron deficiency anemia   . Low vitamin D level   . Migraine     Family  History  Problem Relation Age of Onset  . Anxiety disorder Mother   . Depression Mother   . Bone cancer Mother   . OCD Mother   . Cerebral aneurysm Maternal Uncle   . Drug abuse Brother   . Depression Brother     Social History   Socioeconomic History  . Marital status: Married    Spouse name: Not on file  . Number of children: 1  . Years of education: Not on file  . Highest education level: High school graduate  Occupational History  . Occupation: disabled  Tobacco Use  . Smoking status: Former Games developer  . Smokeless tobacco: Never Used  Vaping Use  . Vaping Use: Never used  Substance and Sexual Activity  . Alcohol use: No  . Drug use: No  . Sexual activity: Not on file  Other Topics Concern  . Not on file  Social History Narrative   Lives at home with husband   Right handed   No caffeine   Social Determinants of Health   Financial Resource Strain: Not on file  Food Insecurity: Not on file  Transportation Needs: Not on file  Physical Activity: Not on file  Stress: Not on file  Social Connections: Not on  file  Intimate Partner Violence: Not on file    Past Medical History, Surgical history, Social history, and Family history were reviewed and updated as appropriate.   Please see review of systems for further details on the patient's review from today.   Objective:   Physical Exam:  There were no vitals taken for this visit.  Physical Exam Neurological:     Mental Status: She is alert and oriented to person, place, and time.     Cranial Nerves: No dysarthria.  Psychiatric:        Attention and Perception: Attention and perception normal.        Mood and Affect: Mood normal.        Speech: Speech normal.        Behavior: Behavior is cooperative.        Thought Content: Thought content normal. Thought content is not paranoid or delusional. Thought content does not include homicidal or suicidal ideation. Thought content does not include homicidal or suicidal plan.        Cognition and Memory: Cognition and memory normal.        Judgment: Judgment normal.     Comments: Insight intact     Lab Review:     Component Value Date/Time   NA 139 03/08/2020 2241   K 4.0 03/08/2020 2241   CL 109 03/08/2020 2241   CO2 22 03/08/2020 2241   GLUCOSE 97 03/08/2020 2241   BUN 17 03/08/2020 2241   CREATININE 0.96 03/08/2020 2241   CALCIUM 9.3 03/08/2020 2241   PROT 6.7 03/08/2020 2241   ALBUMIN 4.3 03/08/2020 2241   AST 20 03/08/2020 2241   ALT 14 03/08/2020 2241   ALKPHOS 78 03/08/2020 2241   BILITOT 0.5 03/08/2020 2241   GFRNONAA >60 03/08/2020 2241   GFRAA >60 03/08/2020 2241       Component Value Date/Time   WBC 3.8 (L) 03/08/2020 2241   RBC 4.92 03/08/2020 2241   HGB 14.6 03/08/2020 2241   HCT 43.5 03/08/2020 2241   PLT 141 (L) 03/08/2020 2241   MCV 88.4 03/08/2020 2241   MCH 29.7 03/08/2020 2241   MCHC 33.6 03/08/2020 2241   RDW 12.5 03/08/2020 2241   LYMPHSABS 0.9 01/20/2020 0354  MONOABS 0.3 01/20/2020 0354   EOSABS 0.1 01/20/2020 0354   BASOSABS 0.0 01/20/2020 0354     No results found for: POCLITH, LITHIUM   No results found for: PHENYTOIN, PHENOBARB, VALPROATE, CBMZ   .res Assessment: Plan:    Will continue current plan of care since target signs and symptoms are well controlled without any tolerability issues. Continue Wellbutrin XL 150 mg po qd for depression. Continue Wellbutrin SR 100 mg po q afternoon for depression. Continue Lunesta 3 mg po QHS for insomnia. Pt to follow-up in 6 months or sooner if clinically indicated. Patient advised to contact office with any questions, adverse effects, or acute worsening in signs and symptoms.   Empress was seen today for follow-up.  Diagnoses and all orders for this visit:  Moderate episode of recurrent major depressive disorder (HCC) -     buPROPion (WELLBUTRIN SR) 100 MG 12 hr tablet; Take 1 tablet (100 mg total) by mouth daily in the afternoon. -     buPROPion (WELLBUTRIN XL) 150 MG 24 hr tablet; Take 1 tablet (150 mg total) by mouth daily.  Primary insomnia -     Eszopiclone 3 MG TABS; Take 1 tablet (3 mg total) by mouth at bedtime. Take immediately before bedtime     Please see After Visit Summary for patient specific instructions.  Future Appointments  Date Time Provider Department Center  07/29/2020 11:15 AM Dolores Frame, MD [facility] None  09/13/2020  1:45 PM Dolores Frame, MD [facility] None    No orders of the defined types were placed in this encounter.     -------------------------------

## 2020-07-19 ENCOUNTER — Other Ambulatory Visit: Payer: Self-pay | Admitting: Family Medicine

## 2020-07-19 ENCOUNTER — Telehealth: Payer: Self-pay | Admitting: Family Medicine

## 2020-07-19 MED ORDER — NARATRIPTAN HCL 2.5 MG PO TABS: ORAL_TABLET | ORAL | 0 refills | Status: DC

## 2020-07-19 NOTE — Telephone Encounter (Signed)
I called pt and relayed that amerge called in, take BID for 5 days.  (do not take with IMITREX / sumatriptan).  Pt verbalized understanding.

## 2020-07-19 NOTE — Telephone Encounter (Signed)
Amerge refilled. Remind her not to exceed two doses of wither triptan in 24 hours or 10 doses per month. TY.

## 2020-07-19 NOTE — Telephone Encounter (Signed)
Pt. Called today wanting to see if Amy could call in something for her almost constant migraines she's been having. Best call back is [phone]

## 2020-07-19 NOTE — Progress Notes (Signed)
I have refilled Amerge. Please remind her not to exceed two doses daily (either Amerge or sumatriptan) or 10 doses per month. TY!

## 2020-07-19 NOTE — Telephone Encounter (Signed)
I called pt and she states that she has been having migraines (clustering of them since the winter weather changes).  She is asking for amerge 2.5mg  tabs (take BID for 5 days in row).  This has helped her in the past.

## 2020-07-29 ENCOUNTER — Ambulatory Visit (INDEPENDENT_AMBULATORY_CARE_PROVIDER_SITE_OTHER): Payer: BC Managed Care – PPO | Admitting: Gastroenterology

## 2020-08-30 ENCOUNTER — Other Ambulatory Visit: Payer: Self-pay

## 2020-08-30 ENCOUNTER — Emergency Department (HOSPITAL_COMMUNITY)
Admission: EM | Admit: 2020-08-30 | Discharge: 2020-08-31 | Disposition: A | Discharge status: Home / Self Care | Payer: BC Managed Care – PPO | Attending: Emergency Medicine | Admitting: Emergency Medicine

## 2020-08-30 ENCOUNTER — Emergency Department (HOSPITAL_COMMUNITY): Payer: BC Managed Care – PPO

## 2020-08-30 ENCOUNTER — Encounter (HOSPITAL_COMMUNITY): Payer: Self-pay | Admitting: Emergency Medicine

## 2020-08-30 DIAGNOSIS — M25551 Pain in right hip: Secondary | ICD-10-CM | POA: Insufficient documentation

## 2020-08-30 DIAGNOSIS — E039 Hypothyroidism, unspecified: Secondary | ICD-10-CM | POA: Diagnosis not present

## 2020-08-30 DIAGNOSIS — Z79899 Other long term (current) drug therapy: Secondary | ICD-10-CM | POA: Insufficient documentation

## 2020-08-30 DIAGNOSIS — R52 Pain, unspecified: Secondary | ICD-10-CM

## 2020-08-30 DIAGNOSIS — M545 Low back pain, unspecified: Secondary | ICD-10-CM | POA: Insufficient documentation

## 2020-08-30 DIAGNOSIS — Z87891 Personal history of nicotine dependence: Secondary | ICD-10-CM | POA: Insufficient documentation

## 2020-08-30 DIAGNOSIS — M533 Sacrococcygeal disorders, not elsewhere classified: Secondary | ICD-10-CM | POA: Diagnosis not present

## 2020-08-30 NOTE — ED Triage Notes (Addendum)
Patient reports right hip pain onset last week , denies injury , MRI done at [facility] , patient stated history of osteoporosis .

## 2020-08-31 ENCOUNTER — Emergency Department (HOSPITAL_COMMUNITY): Payer: BC Managed Care – PPO

## 2020-08-31 DIAGNOSIS — M25551 Pain in right hip: Secondary | ICD-10-CM | POA: Diagnosis not present

## 2020-08-31 MED ORDER — HYDROCODONE-ACETAMINOPHEN 5-325 MG PO TABS
1.0000 | ORAL_TABLET | ORAL | 0 refills | Status: DC | PRN
Start: 2020-08-31 — End: 2021-06-20

## 2020-08-31 MED ORDER — PROMETHAZINE HCL 25 MG PO TABS: 25.0000 mg | ORAL_TABLET | Freq: Four times a day (QID) | ORAL | 0 refills | Status: AC | PRN

## 2020-08-31 MED ORDER — FENTANYL CITRATE (PF) 100 MCG/2ML IJ SOLN
50.0000 ug | Freq: Once | INTRAMUSCULAR | Status: AC
  Administered 2020-08-31: 50 ug via INTRAVENOUS
  Filled 2020-08-31: qty 2

## 2020-08-31 MED ORDER — SODIUM CHLORIDE 0.9 % IV SOLN
12.5000 mg | Freq: Once | INTRAVENOUS | Status: AC
  Administered 2020-08-31: 12.5 mg via INTRAVENOUS
  Filled 2020-08-31: qty 0.5

## 2020-08-31 NOTE — ED Notes (Signed)
Patient transported to CT 

## 2020-08-31 NOTE — ED Notes (Signed)
Pt returned from CT, pt assisted to pt restroom at this time.

## 2020-08-31 NOTE — Discharge Instructions (Addendum)
Follow up with your doctor for further evaluation of hip and back pain. Take medications as prescribed, but take the phenergan 15 minutes prior to Norco to avoid nausea.

## 2020-08-31 NOTE — ED Provider Notes (Signed)
[facility] EMERGENCY DEPARTMENT Provider Note   CSN: [id number] Arrival date & time: 08/30/20  2042     History Chief Complaint  Patient presents with  . Hip Pain     Sabrina Duke is a 65 y.o. female.  Patient with history of celiac, IBS, migraine, GERD, hypothyroid presents for evaluation of right hip pain that started without injury 10 days ago and has gotten progressively worse. She was seen by primary care 7 days ago and diagnosed by plain film x-ray with a hip fracture. An MRI was scheduled and done yesterday but she does not know the result.She reports a history of osteoarthritis that usually affects the right foot, knee and lower back but has never had this much pain. She takes tylenol only because she does not tolerate opioid medications. No abdominal pain, weakness or numbness. She reports back pain as well. No saddle anesthesia, bowel/bladder dysfunction.   The history is provided by the patient. No language interpreter was used.       Past Medical History:  Diagnosis Date  . Allergic rhinitis   . Decreased bone density   . Depression   . GERD (gastroesophageal reflux disease)   . Hypothyroid   . Iron deficiency anemia   . Low vitamin D level   . Migraine     Patient Active Problem List   Diagnosis Date Noted  . Celiac disease 06/14/2020  . Abdominal pain, left lower quadrant 06/14/2020  . Constipation 06/14/2020  . IBS (irritable bowel syndrome) 03/15/2020  . Ileus (HCC) 01/16/2020  . GERD (gastroesophageal reflux disease)   . Hypothyroid   . Abdominal pain, epigastric 02/03/2019  . Nausea without vomiting 02/03/2019  . Acid reflux 02/03/2019  . Anxiety state 04/09/2018  . Migraine, unspecified, intractable, with status migrainosus 04/09/2018  . Insomnia 04/09/2018  . Chronic migraine without aura without status migrainosus, not intractable 07/02/2017    Past Surgical History:  Procedure Laterality Date  . BIOPSY  03/10/2019    Procedure: BIOPSY;  Surgeon: Malissa Hippo, MD;  Location: [facility];  Service: Endoscopy;;  gastric   . BIOPSY  03/24/2020   Procedure: BIOPSY;  Surgeon: Malissa Hippo, MD;  Location: [facility];  Service: Endoscopy;;  duodenum gastric  . COLONOSCOPY WITH PROPOFOL N/A 03/24/2020   Procedure: COLONOSCOPY WITH PROPOFOL;  Surgeon: Malissa Hippo, MD;  Location: [facility];  Service: Endoscopy;  Laterality: N/A;  1225  . ESOPHAGOGASTRODUODENOSCOPY N/A 03/10/2019   Procedure: ESOPHAGOGASTRODUODENOSCOPY (EGD);  Surgeon: Malissa Hippo, MD;  Location: [facility];  Service: Endoscopy;  Laterality: N/A;  2:40-rescheduled 10/5 @11 :55am per office  . ESOPHAGOGASTRODUODENOSCOPY (EGD) WITH PROPOFOL N/A 03/24/2020   Procedure: ESOPHAGOGASTRODUODENOSCOPY (EGD) WITH PROPOFOL;  Surgeon: 03/26/2020, MD;  Location: [facility];  Service: Endoscopy;  Laterality: N/A;  . hemroidectomy  1990  . OVARIAN CYST SURGERY  1972  . SHOULDER SURGERY Left 11/2017  . TUBAL LIGATION  1997  . uterine polyp removal  05/2015     OB History   No obstetric history on file.     Family History  Problem Relation Age of Onset  . Anxiety disorder Mother   . Depression Mother   . Bone cancer Mother   . OCD Mother   . Cerebral aneurysm Maternal Uncle   . Drug abuse Brother   . Depression Brother     Social History   Tobacco Use  . Smoking status: Former 06/2015  . Smokeless  tobacco: Never Used  Vaping Use  . Vaping Use: Never used  Substance Use Topics  . Alcohol use: No  . Drug use: No    Home Medications Prior to Admission medications   Medication Sig Start Date End Date Taking? Authorizing Provider  buPROPion (WELLBUTRIN SR) 100 MG 12 hr tablet Take 1 tablet (100 mg total) by mouth daily in the afternoon. 07/12/20   Corie Chiquito, PMHNP  buPROPion (WELLBUTRIN XL) 150 MG 24 hr tablet Take 1 tablet (150 mg total) by mouth daily. 07/12/20   Corie Chiquito, PMHNP  Eszopiclone 3 MG  TABS Take 1 tablet (3 mg total) by mouth at bedtime. Take immediately before bedtime 08/19/20   Corie Chiquito, PMHNP  hyoscyamine (LEVSIN SL) 0.125 MG SL tablet Place 1 tablet (0.125 mg total) under the tongue every 6 (six) hours as needed for cramping. Patient not taking: Reported on 07/12/2020 06/14/20   Dolores Frame, MD  levothyroxine (SYNTHROID) 50 MCG tablet Take 50 mcg by mouth daily before breakfast. 01/13/19   [provider]  LORazepam (ATIVAN) 0.5 MG tablet TAKE 1/2 TO ONE TABLET BY MOUTH EVERY 6 HOURS AS NEEDED FOR ANXIETY(DO NOT TAKE WITH LUNESTA) Patient not taking: Reported on 06/14/2020 04/15/20   Corie Chiquito, PMHNP  lubiprostone (AMITIZA) 8 MCG capsule Take 1 capsule (8 mcg total) by mouth 2 (two) times daily with a meal. Patient not taking: Reported on 07/12/2020 06/16/20   Dolores Frame, MD  Magnesium 200 MG TABS Take 200 mg by mouth daily.     [provider]  naratriptan (AMERGE) 2.5 MG tablet Take one tablet in AM and at bedtime for 5 days. 07/19/20   Lomax, Amy, NP  polyethylene glycol (MIRALAX / GLYCOLAX) 17 g packet Take 17 g by mouth daily as needed for moderate constipation.    [provider]  promethazine (PHENERGAN) 25 MG tablet Take 1 tablet (25 mg total) by mouth every 6 (six) hours as needed for up to 10 days for nausea or vomiting. 01/23/20 02/02/20  Marguerita Merles, Reuel Boom, MD  RABEprazole (ACIPHEX) 20 MG tablet Take 1 tablet (20 mg total) by mouth daily before breakfast. Patient not taking: No sig reported 03/24/20   Malissa Hippo, MD  sucralfate (CARAFATE) 1 g tablet Take 1 tablet (1 g total) by mouth 4 (four) times daily as needed. Patient not taking: No sig reported 01/26/20   Malissa Hippo, MD  SUMAtriptan (IMITREX) 100 MG tablet Take 1 tablet (100 mg total) by mouth once as needed for up to 1 dose for migraine. May repeat in 2 hours if headache persists or recurs. 06/15/20   Lomax, Amy, NP  topiramate  (TOPAMAX) 50 MG tablet Take 3 tablets (150 mg total) by mouth at bedtime. 06/15/20   Lomax, Amy, NP  Vitamin D, Ergocalciferol, (DRISDOL) 1.25 MG (50000 UNIT) CAPS capsule Take 50,000 Units by mouth once a week. 12/24/19   [provider]    Allergies    Bentyl [dicyclomine]; Ivp dye [iodinated diagnostic agents]; Antihistamines, loratadine-type; Ciprofloxacin; Dymista [azelastine-fluticasone]; Gluten meal; Lortab [hydrocodone-acetaminophen]; Macrobid [nitrofurantoin]; Montelukast; Nsaids; Nurtec [rimegepant sulfate]; Other; Pepcid [famotidine]; Prednisone; Prilosec [omeprazole]; Rosuvastatin calcium; Vantin [cefpodoxime]; and Zofran [ondansetron]  Review of Systems   Review of Systems  Respiratory: Negative.   Cardiovascular: Negative.   Gastrointestinal: Negative.   Genitourinary: Negative for enuresis.  Musculoskeletal: Positive for back pain.       See HPI.  Skin: Negative.  Negative for color change and wound.  Neurological:  Negative.  Negative for weakness and numbness.    Physical Exam Updated Vital Signs BP 103/87   Pulse 73   Temp 97.9 F (36.6 C) (Oral)   Resp 16   SpO2 98%   Physical Exam Vitals and nursing note reviewed.  Constitutional:      Appearance: She is well-developed.  Pulmonary:     Effort: Pulmonary effort is normal.  Musculoskeletal:     Cervical back: Normal range of motion.       Legs:  Skin:    General: Skin is warm and dry.  Neurological:     Mental Status: She is alert and oriented to person, place, and time.     ED Results / Procedures / Treatments   Labs (all labs ordered are listed, but only abnormal results are displayed) Labs Reviewed - No data to display  EKG None  Radiology DG HIP UNILAT WITH PELVIS 2-3 VIEWS RIGHT  Result Date: 08/30/2020 CLINICAL DATA:  Hip pain.  No known injury. Clinical notes state MRI and x-rays done in [place]. EXAM: DG HIP (WITH OR WITHOUT PELVIS) 2-3V RIGHT COMPARISON:  None. FINDINGS:  Right hip joint space is preserved. The femoral head is well seated. No evidence of fracture, a vascular necrosis, or focal bone lesion. Pubic rami are intact. Pubic symphysis and sacroiliac joints are congruent. There is hemi transitional lumbosacral anatomy with enlarged right transverse process of the transitional lumbosacral vertebra IMPRESSION: No acute findings or explanation for hip pain. Recommend correlation with reported MRI performed at an outside institution. Electronically Signed   By: Narda Rutherford M.D.   On: 08/30/2020 22:16    Procedures Procedures   Medications Ordered in ED Medications  promethazine (PHENERGAN) 12.5 mg in sodium chloride 0.9 % 50 mL IVPB (has no administration in time range)  fentaNYL (SUBLIMAZE) injection 50 mcg (has no administration in time range)    ED Course  I have reviewed the triage vital signs and the nursing notes.  Pertinent labs & imaging results that were available during my care of the patient were reviewed by me and considered in my medical decision making (see chart for details).    MDM Rules/Calculators/A&P                          Patient to ED for evaluation of right hip pain x 10 days. No injury. No neurologic symptoms.   MRI results from study done in [place], [place], are not available for review.  Discussed pain control while in the ED. Plain film x-ray of hip done here is negative for fracture. She has tenderness to palpation over lateral right hip as well as midline lumbar spine, reporting that palpation of the lumbar area aggravates her hip. DDx; lumbar radiculopathy vs hip pathology.   Feel that given her report of an x-ray that was positive for fracture and the degree of pain, CT of the hip to r/o fx is indicated. If this is negative, she can follow back up with her doctor for hip MRI results and further treatment.   Discussed pain management. Will prescribe Phenergan and Norco.   Final Clinical Impression(s) / ED  Diagnoses Final diagnoses:  Pain   1. Right hip pain 2. Low back pain  Rx / DC Orders ED Discharge Orders    None       Elpidio Anis, PA-C 08/31/20 0556    Geoffery Lyons, MD 08/31/20 [phone]

## 2020-09-01 ENCOUNTER — Encounter: Payer: Self-pay | Admitting: Psychiatry

## 2020-09-01 ENCOUNTER — Telehealth (INDEPENDENT_AMBULATORY_CARE_PROVIDER_SITE_OTHER): Payer: BC Managed Care – PPO | Admitting: Psychiatry

## 2020-09-01 DIAGNOSIS — F4321 Adjustment disorder with depressed mood: Secondary | ICD-10-CM

## 2020-09-01 DIAGNOSIS — F331 Major depressive disorder, recurrent, moderate: Secondary | ICD-10-CM | POA: Diagnosis not present

## 2020-09-01 DIAGNOSIS — F419 Anxiety disorder, unspecified: Secondary | ICD-10-CM | POA: Diagnosis not present

## 2020-09-01 MED ORDER — FLUOXETINE HCL 10 MG PO CAPS: 10.0000 mg | ORAL_CAPSULE | Freq: Every day | ORAL | 1 refills | Status: DC

## 2020-09-01 MED ORDER — LORAZEPAM 0.5 MG PO TABS: ORAL_TABLET | ORAL | 0 refills | Status: DC

## 2020-09-01 NOTE — Progress Notes (Signed)
Sabrina Duke [id number] 1956-05-05 65 y.o.  Virtual Visit via Video Note  I connected with pt @ on 09/01/20 at  9:00 AM EDT by a video enabled telemedicine application and verified that I am speaking with the correct person using two identifiers.   I discussed the limitations of evaluation and management by telemedicine and the availability of in person appointments. The patient expressed understanding and agreed to proceed.  I discussed the assessment and treatment plan with the patient. The patient was provided an opportunity to ask questions and all were answered. The patient agreed with the plan and demonstrated an understanding of the instructions.   The patient was advised to call back or seek an in-person evaluation if the symptoms worsen or if the condition fails to improve as anticipated.  I provided 30 minutes of non-face-to-face time during this encounter.  The patient was located at home.  The provider was located at [facility].   Corie Chiquito, PMHNP   Subjective:   Patient ID:  Sabrina Duke is a 65 y.o. (DOB April 06, 1956) female.  Chief Complaint:  Chief Complaint  Patient presents with  . Depression    HPI Sabrina Duke presents for follow-up of depression, anxiety, and insomnia. She reports, "I've been doing horrible." She reports that she got COVID in February and had secondary sinus infection. Her niece was pregnant and the baby's heart beat was not found on exam 2 days before niece was scheduled to be induced. Amirra reports that she has been trying to help her niece through this. She reports that her hip starting hurting last week and learned that her hip was fractured. She reports, "the whole weekend I cried. I just can't take much more." She reports that she has been exhausted due to not being able to sleep with pain from hip fracture. She reports frustration in response to recent events. Has had increased depression and uncontrolled crying. She reports  that she has taken Ativan prn to help with uncontrolled crying. She denies significant anxiety. Appetite has been ok. Motivation is ok, however activity is limited due to hip fracture. She reports concentration has been ok. Denies SI.   Past medication trials: Wellbutrin- Effective, well tolerated Trintellix-GI side effects Pristiq- somewhat helpful Lexapro Celexa Prozac- Helped, caused headaches Cymbalta Rexulti Vraylar-jittery, hallucinations Latuda-joint aches Abilify-weight gain Lamictal BuSpar- Weight gain Topamax Trazodone-excessive somnolence Lunesta Ambien-ineffective Adderall XR Ativan-Effective   Review of Systems:  Review of Systems  Musculoskeletal: Negative for gait problem.       Hip pain  Neurological: Negative for tremors.  Psychiatric/Behavioral:       Please refer to HPI    Medications: I have reviewed the patient's current medications.  Current Outpatient Medications  Medication Sig Dispense Refill  . buPROPion (WELLBUTRIN SR) 100 MG 12 hr tablet Take 1 tablet (100 mg total) by mouth daily in the afternoon. 90 tablet 1  . buPROPion (WELLBUTRIN XL) 150 MG 24 hr tablet Take 1 tablet (150 mg total) by mouth daily. 90 tablet 1  . Eszopiclone 3 MG TABS Take 1 tablet (3 mg total) by mouth at bedtime. Take immediately before bedtime 30 tablet 5  . FLUoxetine (PROZAC) 10 MG capsule Take 1 capsule (10 mg total) by mouth daily. 30 capsule 1  . hyoscyamine (LEVSIN SL) 0.125 MG SL tablet Place 1 tablet (0.125 mg total) under the tongue every 6 (six) hours as needed for cramping. 30 tablet 1  . levothyroxine (SYNTHROID) 50 MCG tablet Take 50  mcg by mouth daily before breakfast.    . Magnesium 200 MG TABS Take 200 mg by mouth daily.     . naratriptan (AMERGE) 2.5 MG tablet Take one tablet in AM and at bedtime for 5 days. 10 tablet 0  . polyethylene glycol (MIRALAX / GLYCOLAX) 17 g packet Take 17 g by mouth daily as needed for moderate constipation.    . promethazine  (PHENERGAN) 25 MG tablet Take 1 tablet (25 mg total) by mouth every 6 (six) hours as needed for nausea or vomiting. 12 tablet 0  . promethazine (PHENERGAN) 25 MG tablet Take by mouth.    . SUMAtriptan (IMITREX) 100 MG tablet Take 1 tablet (100 mg total) by mouth once as needed for up to 1 dose for migraine. May repeat in 2 hours if headache persists or recurs. 10 tablet 11  . topiramate (TOPAMAX) 50 MG tablet Take 3 tablets (150 mg total) by mouth at bedtime. 270 tablet 3  . Vitamin D, Ergocalciferol, (DRISDOL) 1.25 MG (50000 UNIT) CAPS capsule Take 50,000 Units by mouth once a week.    Marland Kitchen HYDROcodone-acetaminophen (NORCO/VICODIN) 5-325 MG tablet Take 1 tablet by mouth every 4 (four) hours as needed for severe pain. (Patient not taking: Reported on 09/01/2020) 12 tablet 0  . LORazepam (ATIVAN) 0.5 MG tablet TAKE 1/2 TO ONE TABLET BY MOUTH EVERY 6 HOURS AS NEEDED FOR ANXIETY(DO NOT TAKE WITH LUNESTA) 10 tablet 0  . lubiprostone (AMITIZA) 8 MCG capsule Take 1 capsule (8 mcg total) by mouth 2 (two) times daily with a meal. (Patient not taking: Reported on 07/12/2020) 60 capsule 2  . sucralfate (CARAFATE) 1 g tablet Take 1 tablet (1 g total) by mouth 4 (four) times daily as needed. (Patient not taking: No sig reported) 120 tablet 5   No current facility-administered medications for this visit.    Medication Side Effects: None  Allergies:  Allergies  Allergen Reactions  . Bentyl [Dicyclomine] Other (See Comments)    Patient states it caused leg and arm spasms.  Ardine Bjork [Iodinated Diagnostic Agents] Anaphylaxis  . Antihistamines, Loratadine-Type Other (See Comments)    Sleepy  . Ciprofloxacin Nausea And Vomiting  . Dymista [Azelastine-Fluticasone] Other (See Comments)    Heachache  . Gluten Meal   . Lortab [Hydrocodone-Acetaminophen] Nausea And Vomiting  . Macrobid [Nitrofurantoin] Other (See Comments)    Unknown  . Montelukast Other (See Comments)    Sleep  . Nsaids     Pt can tolerate  small amounts of Aleve "mess my stomach up, have had ulcer in the past"  . Nurtec [Rimegepant Sulfate] Nausea Only  . Other     Patient not sure about these allergies  Darvocet-   Mermantin- "makes me sick on my stomach"  Bio Citrate Calcium with Vitamin D- "hurt stomach"     . Pepcid [Famotidine] Other (See Comments)    Headache  . Prednisone     "makes me hyper and gives me a headache"  . Prilosec [Omeprazole] Other (See Comments)    Headache  . Rosuvastatin Calcium Other (See Comments)    Headaches.  Varney Baas [Cefpodoxime] Nausea And Vomiting  . Zofran [Ondansetron] Other (See Comments)    Causes headaches per pt    Past Medical History:  Diagnosis Date  . Allergic rhinitis   . Chronic kidney disease   . Decreased bone density   . Depression   . GERD (gastroesophageal reflux disease)   . Hypothyroid   . Iron deficiency anemia   .  Low vitamin D level   . Migraine   . Osteoporosis     Family History  Problem Relation Age of Onset  . Anxiety disorder Mother   . Depression Mother   . Bone cancer Mother   . OCD Mother   . Cerebral aneurysm Maternal Uncle   . Drug abuse Brother   . Depression Brother     Social History   Socioeconomic History  . Marital status: Married    Spouse name: Not on file  . Number of children: 1  . Years of education: Not on file  . Highest education level: High school graduate  Occupational History  . Occupation: disabled  Tobacco Use  . Smoking status: Former Games developer  . Smokeless tobacco: Never Used  Vaping Use  . Vaping Use: Never used  Substance and Sexual Activity  . Alcohol use: No  . Drug use: No  . Sexual activity: Not on file  Other Topics Concern  . Not on file  Social History Narrative   Lives at home with husband   Right handed   No caffeine   Social Determinants of Health   Financial Resource Strain: Not on file  Food Insecurity: Not on file  Transportation Needs: Not on file  Physical Activity: Not  on file  Stress: Not on file  Social Connections: Not on file  Intimate Partner Violence: Not on file    Past Medical History, Surgical history, Social history, and Family history were reviewed and updated as appropriate.   Please see review of systems for further details on the patient's review from today.   Objective:   Physical Exam:  There were no vitals taken for this visit.  Physical Exam Neurological:     Mental Status: She is alert and oriented to person, place, and time.     Cranial Nerves: No dysarthria.  Psychiatric:        Attention and Perception: Attention and perception normal.        Mood and Affect: Mood is depressed.        Speech: Speech normal.        Behavior: Behavior is cooperative.        Thought Content: Thought content normal. Thought content is not paranoid or delusional. Thought content does not include homicidal or suicidal ideation. Thought content does not include homicidal or suicidal plan.        Cognition and Memory: Cognition and memory normal.        Judgment: Judgment normal.     Comments: Insight intact     Lab Review:     Component Value Date/Time   NA 139 03/08/2020 2241   K 4.0 03/08/2020 2241   CL 109 03/08/2020 2241   CO2 22 03/08/2020 2241   GLUCOSE 97 03/08/2020 2241   BUN 17 03/08/2020 2241   CREATININE 0.96 03/08/2020 2241   CALCIUM 9.3 03/08/2020 2241   PROT 6.7 03/08/2020 2241   ALBUMIN 4.3 03/08/2020 2241   AST 20 03/08/2020 2241   ALT 14 03/08/2020 2241   ALKPHOS 78 03/08/2020 2241   BILITOT 0.5 03/08/2020 2241   GFRNONAA >60 03/08/2020 2241   GFRAA >60 03/08/2020 2241       Component Value Date/Time   WBC 3.8 (L) 03/08/2020 2241   RBC 4.92 03/08/2020 2241   HGB 14.6 03/08/2020 2241   HCT 43.5 03/08/2020 2241   PLT 141 (L) 03/08/2020 2241   MCV 88.4 03/08/2020 2241   MCH 29.7 03/08/2020 2241  MCHC 33.6 03/08/2020 2241   RDW 12.5 03/08/2020 2241   LYMPHSABS 0.9 01/20/2020 0354   MONOABS 0.3 01/20/2020  0354   EOSABS 0.1 01/20/2020 0354   BASOSABS 0.0 01/20/2020 0354    No results found for: POCLITH, LITHIUM   No results found for: PHENYTOIN, PHENOBARB, VALPROATE, CBMZ   .res Assessment: Plan:   Pt seen for 30 minutes. Discussed that current depressive s/s and increased anxiety are likely due to situational factors and losses. Discussed continuing Wellbutrin XL 150 mg po q am and Wellbutrin SR 100 mg in the afternoon for depression since this combination has been most effective and well tolerated for her. Discussed adding an additional medication for augmentation of depression. Discussed that Prozac seemed to be more effective than other past medications and it is unclear if headaches were secondary to Prozac or may have been caused by other factors. Discussed re-starting very low dose Prozac to reduce potential for side effects. Pt agrees to starting Prozac 10 mg qd for anxiety and depression.  Will refill Ativan prn to be used for uncontrolled crying. Advised pt not to take ativan with pain medication or Lunesta.  Pt to follow-up in one month or sooner if clinically indicated.  Patient advised to contact office with any questions, adverse effects, or acute worsening in signs and symptoms.  Cordelia PenSherry was seen today for depression.  Diagnoses and all orders for this visit:  Moderate episode of recurrent major depressive disorder (HCC) -     FLUoxetine (PROZAC) 10 MG capsule; Take 1 capsule (10 mg total) by mouth daily.  Anxiety -     FLUoxetine (PROZAC) 10 MG capsule; Take 1 capsule (10 mg total) by mouth daily.  Grief reaction -     LORazepam (ATIVAN) 0.5 MG tablet; TAKE 1/2 TO ONE TABLET BY MOUTH EVERY 6 HOURS AS NEEDED FOR ANXIETY(DO NOT TAKE WITH LUNESTA)     Please see After Visit Summary for patient specific instructions.  Future Appointments  Date Time Provider Department Center  10/01/2020 11:00 AM Corie Chiquitoarter, Florene Brill, PMHNP [facility] None  11/11/2020 10:15 AM Dolores Frameastaneda Mayorga,  Daniel, MD [facility] None    No orders of the defined types were placed in this encounter.     -------------------------------

## 2020-09-13 ENCOUNTER — Ambulatory Visit (INDEPENDENT_AMBULATORY_CARE_PROVIDER_SITE_OTHER): Payer: BC Managed Care – PPO | Admitting: Gastroenterology

## 2020-10-01 ENCOUNTER — Telehealth: Payer: BC Managed Care – PPO | Admitting: Psychiatry

## 2020-10-18 ENCOUNTER — Telehealth: Payer: Self-pay | Admitting: Family Medicine

## 2020-10-18 MED ORDER — NARATRIPTAN HCL 2.5 MG PO TABS: ORAL_TABLET | ORAL | 0 refills | Status: DC

## 2020-10-18 NOTE — Addendum Note (Signed)
Addended by: Shawnie Dapper L on: 10/18/2020 03:42 PM   Modules accepted: Orders

## 2020-10-18 NOTE — Telephone Encounter (Signed)
Pt is asking if an Emerge Pak could be called in for her due to a cycle of headaches (to-[facility])

## 2020-10-18 NOTE — Telephone Encounter (Signed)
Called patient she is experiencing a 5-day migraine she cannot get rid of.  She has tried her sumatriptan and  is taking her Topamax but it has not helped.  It was triggered by  allergies than a sinus infection.  She has tried Amerge in the past as a 5-day treatment which has worked well for her and she is asking for a refill on that.  She know not to use other triptans when using this.  [facility].

## 2020-10-18 NOTE — Addendum Note (Signed)
Addended by: Guy Begin on: 10/18/2020 02:37 PM   Modules accepted: Orders

## 2020-10-22 DIAGNOSIS — R1032 Left lower quadrant pain: Secondary | ICD-10-CM | POA: Diagnosis not present

## 2020-10-22 DIAGNOSIS — K900 Celiac disease: Secondary | ICD-10-CM | POA: Diagnosis not present

## 2020-10-22 DIAGNOSIS — Z8719 Personal history of other diseases of the digestive system: Secondary | ICD-10-CM | POA: Diagnosis not present

## 2020-10-22 DIAGNOSIS — K5909 Other constipation: Secondary | ICD-10-CM | POA: Diagnosis not present

## 2020-10-22 DIAGNOSIS — G43909 Migraine, unspecified, not intractable, without status migrainosus: Secondary | ICD-10-CM | POA: Diagnosis not present

## 2020-10-22 DIAGNOSIS — E039 Hypothyroidism, unspecified: Secondary | ICD-10-CM | POA: Diagnosis not present

## 2020-11-11 ENCOUNTER — Ambulatory Visit (INDEPENDENT_AMBULATORY_CARE_PROVIDER_SITE_OTHER): Payer: BC Managed Care – PPO | Admitting: Gastroenterology

## 2020-11-17 ENCOUNTER — Telehealth: Payer: Self-pay | Admitting: Family Medicine

## 2020-11-17 MED ORDER — TOPIRAMATE 50 MG PO TABS: 150.0000 mg | ORAL_TABLET | Freq: Every day | ORAL | 1 refills | Status: DC

## 2020-11-17 NOTE — Telephone Encounter (Signed)
Pt request refill Topiramate (TOPAMAX) 50 MG. Sent to [facility] [address] [place], [place] [address], Phone# [phone]

## 2020-11-17 NOTE — Telephone Encounter (Signed)
Spoke to patient and confirmed refill. Requesting Topamax be sent to [facility] and future refills as well. Pt had no further questions. 6 month supply sent.

## 2020-12-17 ENCOUNTER — Telehealth (INDEPENDENT_AMBULATORY_CARE_PROVIDER_SITE_OTHER): Payer: BC Managed Care – PPO | Admitting: Psychiatry

## 2020-12-17 ENCOUNTER — Encounter: Payer: Self-pay | Admitting: Psychiatry

## 2020-12-17 DIAGNOSIS — F5101 Primary insomnia: Secondary | ICD-10-CM

## 2020-12-17 DIAGNOSIS — F331 Major depressive disorder, recurrent, moderate: Secondary | ICD-10-CM

## 2020-12-17 DIAGNOSIS — F4321 Adjustment disorder with depressed mood: Secondary | ICD-10-CM | POA: Diagnosis not present

## 2020-12-17 MED ORDER — ESZOPICLONE 3 MG PO TABS: 3.0000 mg | ORAL_TABLET | Freq: Every day | ORAL | 4 refills | Status: DC

## 2020-12-17 MED ORDER — BUPROPION HCL ER (SR) 100 MG PO TB12: 100.0000 mg | ORAL_TABLET | Freq: Every day | ORAL | 1 refills | Status: DC

## 2020-12-17 MED ORDER — BUPROPION HCL ER (XL) 150 MG PO TB24: 150.0000 mg | ORAL_TABLET | Freq: Every day | ORAL | 1 refills | Status: DC

## 2020-12-17 MED ORDER — LORAZEPAM 0.5 MG PO TABS: ORAL_TABLET | ORAL | 0 refills | Status: DC

## 2020-12-17 NOTE — Progress Notes (Signed)
Sabrina Duke [id number] [birth year] 65 y.o.  Virtual Visit via Video Note  I connected with pt @ on 12/17/20 at  1:30 PM EDT by a video enabled telemedicine application and verified that I am speaking with the correct person using two identifiers.   I discussed the limitations of evaluation and management by telemedicine and the availability of in person appointments. The patient expressed understanding and agreed to proceed.  I discussed the assessment and treatment plan with the patient. The patient was provided an opportunity to ask questions and all were answered. The patient agreed with the plan and demonstrated an understanding of the instructions.   The patient was advised to call back or seek an in-person evaluation if the symptoms worsen or if the condition fails to improve as anticipated.  I provided 30 minutes of non-face-to-face time during this encounter.  The patient was located at home.  The provider was located at [facility].   Corie Chiquito, PMHNP   Subjective:   Patient ID:  Sabrina Duke is a 65 y.o. (DOB December 07, 1955) female.  Chief Complaint:  Chief Complaint  Patient presents with   Follow-up    Anxiety and depression    HPI Sabrina Duke presents for follow-up of depression and anxiety. She reports that she has been doing well. She reports that she was not able to tolerate Prozac due to headaches and sleepiness. She has used Ativan prn for crying episodes. She reports that she is having less crying episodes. She reports periods of anxiety and feeling "jittery inside." She reports that she will take Ativan prn when this occurs and reports that it does not happen often. She reports that she occasionally gets "a little disgusted" in response to marital stressors. She reports adequate sleep. She reports that her appetite has been decreased. Energy and motivation have been ok. Concentration is ok. She reports that she had COVID again and noticed "brain fog"  for about a month. Denies SI.   She reports that she has had some marital stressors. Reports that husband is wanting to buy a house that she does not want. She reports that they have not been speaking. For the last 3 weeks. Reports that mother is nearing the end stages of life and reports concern about her care in the SNF. She reports that she frequently looks in on her mother and advocates for her.   She has been keeping her grandchildren 3 days a week. Niece is doing ok after loss of baby. Family in [place] continues to grieve the loss of her brother. Married for 34 years.  She plans to volunteer with a political party when her grandchildren return to school.   Past medication trials: Wellbutrin- Effective, well tolerated Trintellix-GI side effects Pristiq- somewhat helpful Lexapro Celexa Prozac- Helped, caused headaches Cymbalta Rexulti Vraylar-jittery, hallucinations Latuda-joint aches Abilify-weight gain Lamictal BuSpar- Weight gain Topamax Trazodone-excessive somnolence Lunesta Ambien-ineffective Adderall XR Ativan-Effective   Review of Systems:  Review of Systems  Gastrointestinal:        Reports improved s/s with following Celiac diet  Musculoskeletal:  Negative for gait problem.  Neurological:        Improved HA's with following Celiac diet  Psychiatric/Behavioral:         Please refer to HPI   Medications: I have reviewed the patient's current medications.  Current Outpatient Medications  Medication Sig Dispense Refill   levothyroxine (SYNTHROID) 50 MCG tablet Take 50 mcg by mouth daily before breakfast.  Magnesium 200 MG TABS Take 200 mg by mouth daily.      promethazine (PHENERGAN) 25 MG tablet Take 1 tablet (25 mg total) by mouth every 6 (six) hours as needed for nausea or vomiting. 12 tablet 0   SUMAtriptan (IMITREX) 100 MG tablet Take 1 tablet (100 mg total) by mouth once as needed for up to 1 dose for migraine. May repeat in 2 hours if headache  persists or recurs. 10 tablet 11   topiramate (TOPAMAX) 50 MG tablet Take 3 tablets (150 mg total) by mouth at bedtime. 270 tablet 1   Vitamin D, Ergocalciferol, (DRISDOL) 1.25 MG (50000 UNIT) CAPS capsule Take 50,000 Units by mouth once a week.     buPROPion (WELLBUTRIN SR) 100 MG 12 hr tablet Take 1 tablet (100 mg total) by mouth daily in the afternoon. 90 tablet 1   buPROPion (WELLBUTRIN XL) 150 MG 24 hr tablet Take 1 tablet (150 mg total) by mouth daily. 90 tablet 1   [START ON 01/14/2021] Eszopiclone 3 MG TABS Take 1 tablet (3 mg total) by mouth at bedtime. Take immediately before bedtime 30 tablet 4   HYDROcodone-acetaminophen (NORCO/VICODIN) 5-325 MG tablet Take 1 tablet by mouth every 4 (four) hours as needed for severe pain. (Patient not taking: Reported on 09/01/2020) 12 tablet 0   hyoscyamine (LEVSIN SL) 0.125 MG SL tablet Place 1 tablet (0.125 mg total) under the tongue every 6 (six) hours as needed for cramping. (Patient not taking: Reported on 12/17/2020) 30 tablet 1   LORazepam (ATIVAN) 0.5 MG tablet TAKE 1/2 TO ONE TABLET BY MOUTH EVERY 6 HOURS AS NEEDED FOR ANXIETY(DO NOT TAKE WITH LUNESTA) 10 tablet 0   lubiprostone (AMITIZA) 8 MCG capsule Take 1 capsule (8 mcg total) by mouth 2 (two) times daily with a meal. (Patient not taking: Reported on 07/12/2020) 60 capsule 2   polyethylene glycol (MIRALAX / GLYCOLAX) 17 g packet Take 17 g by mouth daily as needed for moderate constipation.     promethazine (PHENERGAN) 25 MG tablet Take by mouth.     sucralfate (CARAFATE) 1 g tablet Take 1 tablet (1 g total) by mouth 4 (four) times daily as needed. (Patient not taking: No sig reported) 120 tablet 5   No current facility-administered medications for this visit.    Medication Side Effects: None  Allergies:  Allergies  Allergen Reactions   Bentyl [Dicyclomine] Other (See Comments)    Patient states it caused leg and arm spasms.   Ivp Dye [Iodinated Diagnostic Agents] Anaphylaxis    Antihistamines, Loratadine-Type Other (See Comments)    Sleepy   Ciprofloxacin Nausea And Vomiting   Dymista [Azelastine-Fluticasone] Other (See Comments)    Heachache   Gluten Meal    Lortab [Hydrocodone-Acetaminophen] Nausea And Vomiting   Macrobid [Nitrofurantoin] Other (See Comments)    Unknown   Montelukast Other (See Comments)    Sleep   Nsaids     Pt can tolerate small amounts of Aleve "mess my stomach up, have had ulcer in the past"   Nurtec [Rimegepant Sulfate] Nausea Only   Other     Patient not sure about these allergies  Darvocet-   Mermantin- "makes me sick on my stomach"  Bio Citrate Calcium with Vitamin D- "hurt stomach"      Pepcid [Famotidine] Other (See Comments)    Headache   Prednisone     "makes me hyper and gives me a headache"   Prilosec [Omeprazole] Other (See Comments)    Headache  Rosuvastatin Calcium Other (See Comments)    Headaches.   Vantin [Cefpodoxime] Nausea And Vomiting   Zofran [Ondansetron] Other (See Comments)    Causes headaches per pt    Past Medical History:  Diagnosis Date   Allergic rhinitis    Chronic kidney disease    Decreased bone density    Depression    GERD (gastroesophageal reflux disease)    Hypothyroid    Iron deficiency anemia    Low vitamin D level    Migraine    Osteoporosis     Family History  Problem Relation Age of Onset   Anxiety disorder Mother    Depression Mother    Bone cancer Mother    OCD Mother    Cerebral aneurysm Maternal Uncle    Drug abuse Brother    Depression Brother     Social History   Socioeconomic History   Marital status: Married    Spouse name: Not on file   Number of children: 1   Years of education: Not on file   Highest education level: High school graduate  Occupational History   Occupation: disabled  Tobacco Use   Smoking status: Former   Smokeless tobacco: Never  Building services engineer Use: Never used  Substance and Sexual Activity   Alcohol use: No   Drug  use: No   Sexual activity: Not on file  Other Topics Concern   Not on file  Social History Narrative   Lives at home with husband   Right handed   No caffeine   Social Determinants of Corporate investment banker Strain: Not on file  Food Insecurity: Not on file  Transportation Needs: Not on file  Physical Activity: Not on file  Stress: Not on file  Social Connections: Not on file  Intimate Partner Violence: Not on file    Past Medical History, Surgical history, Social history, and Family history were reviewed and updated as appropriate.   Please see review of systems for further details on the patient's review from today.   Objective:   Physical Exam:  There were no vitals taken for this visit.  Physical Exam Neurological:     Mental Status: She is alert and oriented to person, place, and time.     Cranial Nerves: No dysarthria.  Psychiatric:        Attention and Perception: Attention and perception normal.        Speech: Speech normal.        Behavior: Behavior is cooperative.        Thought Content: Thought content normal. Thought content is not paranoid or delusional. Thought content does not include homicidal or suicidal ideation. Thought content does not include homicidal or suicidal plan.        Cognition and Memory: Cognition and memory normal.        Judgment: Judgment normal.     Comments: Insight intact Mood is appropriate to content and affect is congruent    Lab Review:     Component Value Date/Time   NA 139 03/08/2020 2241   K 4.0 03/08/2020 2241   CL 109 03/08/2020 2241   CO2 22 03/08/2020 2241   GLUCOSE 97 03/08/2020 2241   BUN 17 03/08/2020 2241   CREATININE 0.96 03/08/2020 2241   CALCIUM 9.3 03/08/2020 2241   PROT 6.7 03/08/2020 2241   ALBUMIN 4.3 03/08/2020 2241   AST 20 03/08/2020 2241   ALT 14 03/08/2020 2241   ALKPHOS 78 03/08/2020 2241  BILITOT 0.5 03/08/2020 2241   GFRNONAA >60 03/08/2020 2241   GFRAA >60 03/08/2020 2241        Component Value Date/Time   WBC 3.8 (L) 03/08/2020 2241   RBC 4.92 03/08/2020 2241   HGB 14.6 03/08/2020 2241   HCT 43.5 03/08/2020 2241   PLT 141 (L) 03/08/2020 2241   MCV 88.4 03/08/2020 2241   MCH 29.7 03/08/2020 2241   MCHC 33.6 03/08/2020 2241   RDW 12.5 03/08/2020 2241   LYMPHSABS 0.9 01/20/2020 0354   MONOABS 0.3 01/20/2020 0354   EOSABS 0.1 01/20/2020 0354   BASOSABS 0.0 01/20/2020 0354    No results found for: POCLITH, LITHIUM   No results found for: PHENYTOIN, PHENOBARB, VALPROATE, CBMZ   .res Assessment: Plan:    Patient seen for 30 minutes and time spent discussing recent exacerbation of mood and anxiety signs and symptoms in response to acute stressor.  Patient reports that her mood and anxiety signs and symptoms have been improving and she would prefer to continue current medications without changes at this time. Will remove Prozac from the medication list since she reports  intolerable side effects with Prozac to include headaches and fatigue. Will continue Wellbutrin XL 150 mg daily for depression. Continue Wellbutrin SR 100 mg daily in the afternoon for depression. Continue Lunesta 3 mg at bedtime for insomnia. Will continue Ativan 0.5 mg 1/2 to 1 tablet as needed for severe anxiety. Patient to follow-up in 6 months or sooner if clinically indicated. Patient advised to contact office with any questions, adverse effects, or acute worsening in signs and symptoms. Requested that patient contact office if she decides that she would like to see a therapist in the future.    Sabrina Duke was seen today for follow-up.  Diagnoses and all orders for this visit:  Moderate episode of recurrent major depressive disorder (HCC) -     buPROPion (WELLBUTRIN SR) 100 MG 12 hr tablet; Take 1 tablet (100 mg total) by mouth daily in the afternoon. -     buPROPion (WELLBUTRIN XL) 150 MG 24 hr tablet; Take 1 tablet (150 mg total) by mouth daily.  Primary insomnia -     Eszopiclone 3  MG TABS; Take 1 tablet (3 mg total) by mouth at bedtime. Take immediately before bedtime  Grief reaction -     LORazepam (ATIVAN) 0.5 MG tablet; TAKE 1/2 TO ONE TABLET BY MOUTH EVERY 6 HOURS AS NEEDED FOR ANXIETY(DO NOT TAKE WITH LUNESTA)    Please see After Visit Summary for patient specific instructions.  No future appointments.  No orders of the defined types were placed in this encounter.     -------------------------------

## 2021-02-23 DIAGNOSIS — K900 Celiac disease: Secondary | ICD-10-CM | POA: Diagnosis not present

## 2021-03-07 ENCOUNTER — Other Ambulatory Visit: Payer: Self-pay

## 2021-03-07 MED ORDER — TOPIRAMATE 50 MG PO TABS
150.0000 mg | ORAL_TABLET | Freq: Every day | ORAL | 1 refills | Status: DC
Start: 2021-03-07 — End: 2021-05-18

## 2021-03-29 ENCOUNTER — Telehealth: Payer: Self-pay | Admitting: Family Medicine

## 2021-03-29 ENCOUNTER — Other Ambulatory Visit: Payer: Self-pay | Admitting: *Deleted

## 2021-03-29 MED ORDER — NARATRIPTAN HCL 2.5 MG PO TABS: ORAL_TABLET | ORAL | 0 refills | Status: DC

## 2021-03-29 NOTE — Telephone Encounter (Signed)
Pt called, since Saturday been having migraines. Went to PCP he prescribed an antibiotic for sinus infection. Can not get rid of the migraine. Can you send in a prescription for Amerge to [facility]  Would like a call from the nurse.

## 2021-03-29 NOTE — Telephone Encounter (Signed)
Called pt back. She was placed on amoxicillin 500mg , take 1 po q12 hr x10days by PCP for sinus infection. She will take last dose this Thursday. Feels like this is clearing up. Has tried taking sumatriptan x4 days but ineffective. Requesting Amerge that AL,NP has rx'd in the past: Amerge 2.5mg  tab, directions: Take one tablet in AM and at bedtime for 5 days #10, 0 refills.   Aware I will send to AL,NP for review/approval.

## 2021-03-29 NOTE — Telephone Encounter (Signed)
Called pt back and advised AL,NP approved sending in Amerge. Sent into her pharmacy. Pt verbalized understanding and appreciation.

## 2021-04-14 ENCOUNTER — Other Ambulatory Visit: Payer: Self-pay

## 2021-04-14 ENCOUNTER — Telehealth: Payer: Self-pay | Admitting: Family Medicine

## 2021-04-14 ENCOUNTER — Other Ambulatory Visit: Payer: Self-pay | Admitting: Psychiatry

## 2021-04-14 DIAGNOSIS — F4321 Adjustment disorder with depressed mood: Secondary | ICD-10-CM

## 2021-04-14 MED ORDER — SUMATRIPTAN SUCCINATE 100 MG PO TABS: 100.0000 mg | ORAL_TABLET | Freq: Once | ORAL | 3 refills | Status: DC | PRN

## 2021-04-14 NOTE — Telephone Encounter (Signed)
Pt is requesting a refill for SUMAtriptan (IMITREX) 100 MG tablet.  Pharmacy: [facility]

## 2021-04-14 NOTE — Telephone Encounter (Signed)
Rx sent to pharmacy   

## 2021-05-06 ENCOUNTER — Encounter: Payer: Self-pay | Admitting: Psychiatry

## 2021-05-06 ENCOUNTER — Ambulatory Visit (INDEPENDENT_AMBULATORY_CARE_PROVIDER_SITE_OTHER): Payer: Medicare Other | Admitting: Psychiatry

## 2021-05-06 ENCOUNTER — Other Ambulatory Visit: Payer: Self-pay

## 2021-05-06 DIAGNOSIS — F5101 Primary insomnia: Secondary | ICD-10-CM | POA: Diagnosis not present

## 2021-05-06 DIAGNOSIS — F4321 Adjustment disorder with depressed mood: Secondary | ICD-10-CM

## 2021-05-06 DIAGNOSIS — F419 Anxiety disorder, unspecified: Secondary | ICD-10-CM

## 2021-05-06 DIAGNOSIS — F331 Major depressive disorder, recurrent, moderate: Secondary | ICD-10-CM | POA: Diagnosis not present

## 2021-05-06 MED ORDER — SERTRALINE HCL 50 MG PO TABS: ORAL_TABLET | ORAL | 2 refills | Status: DC

## 2021-05-06 MED ORDER — BUPROPION HCL ER (SR) 100 MG PO TB12: 100.0000 mg | ORAL_TABLET | Freq: Every day | ORAL | 1 refills | Status: DC

## 2021-05-06 MED ORDER — BUPROPION HCL ER (XL) 150 MG PO TB24: 150.0000 mg | ORAL_TABLET | Freq: Every day | ORAL | 1 refills | Status: DC

## 2021-05-06 MED ORDER — LORAZEPAM 0.5 MG PO TABS: ORAL_TABLET | ORAL | 1 refills | Status: DC

## 2021-05-06 MED ORDER — ESZOPICLONE 3 MG PO TABS
3.0000 mg | ORAL_TABLET | Freq: Every day | ORAL | 5 refills | Status: DC
Start: 2021-05-14 — End: 2021-06-20

## 2021-05-06 NOTE — Progress Notes (Signed)
[name] [id number] 14-Nov-1955 65 y.o.  Subjective:   Patient ID:  Sabrina Duke is a 65 y.o. (DOB [birth year]) female.  Chief Complaint:  Chief Complaint  Patient presents with   Anxiety    Anxiety    Sabrina Duke presents to the office today for follow-up of anxiety and depression. She reports in September she started having crying episodes and shortness of breath. She reports that she started taking Ativan. She reports that her anxiety has been steadily worsening. Reports that yesterday she was crying uncontrollable before going to a Christmas program for her grandson. She denies excessive worry. Denies persistent sadness. Energy is ok- "not as much as it used to be." She reports motivation has been lower. Sleeping well. Sleeps from 1 am to 10 pm. Appetite has been good. Concentration has been ok. She reports that she is able to enjoy things. Denies SI.  Reports that her husband had surgery and then decided to retire. She reports that husband plans on returning to consulting work a few days a week. She reports that she is not used to him being at home all the time.   She reports that her mother remains in SNF and she frequently helps her and checks in. Reports that mother is demanding and comments that her other siblings would have taken better care of her if they were alive. Reports mother favored 2 of her brothers. Mother will make "make me feel guilty" for helping or spending time with other family.   Past medication trials: Wellbutrin- Effective, well tolerated Trintellix-GI side effects Pristiq- somewhat helpful Lexapro Celexa Prozac- Helped, caused headaches Cymbalta Rexulti Vraylar-jittery, hallucinations Latuda-joint aches Abilify-weight gain Lamictal BuSpar- Weight gain Topamax Trazodone-excessive somnolence Lunesta Ambien-ineffective Adderall XR Ativan-Effective  PHQ2-9    Flowsheet Row Nutrition from 05/11/2020 in [facility]  PHQ-2 Total Score 0      Flowsheet Row ED from 08/30/2020 in [facility] EMERGENCY DEPARTMENT  C-SSRS RISK CATEGORY No Risk        Review of Systems:  Review of Systems  Musculoskeletal:  Negative for gait problem.  Allergic/Immunologic: Positive for environmental allergies.  Neurological:  Positive for headaches. Negative for tremors.  Psychiatric/Behavioral:         Please refer to HPI   Medications: I have reviewed the patient's current medications.  Current Outpatient Medications  Medication Sig Dispense Refill   levothyroxine (SYNTHROID) 50 MCG tablet Take 50 mcg by mouth daily before breakfast.     LORazepam (ATIVAN) 0.5 MG tablet TAKE 1/2 TO ONE TABLET BY MOUTH EVERY 6 HOURS AS NEEDED FOR ANXIETY(DO NOT TAKE WITH LUNESTA) 10 tablet 0   Magnesium 200 MG TABS Take 200 mg by mouth daily.      naratriptan (AMERGE) 2.5 MG tablet Take one tablet in AM and at bedtime for 5 days. 10 tablet 0   promethazine (PHENERGAN) 25 MG tablet Take 1 tablet (25 mg total) by mouth every 6 (six) hours as needed for nausea or vomiting. 12 tablet 0   sertraline (ZOLOFT) 50 MG tablet Take 1/2 tab po qd x 2-4 days, then 1 tab po qd 30 tablet 2   SUMAtriptan (IMITREX) 100 MG tablet Take 1 tablet (100 mg total) by mouth once as needed for up to 1 dose for migraine. May repeat in 2 hours if headache persists or recurs. PLEASE KEEP UPCOMING APPT FOR CONTINUED REFILLS 10 tablet 3   topiramate (TOPAMAX) 50 MG tablet Take 3  tablets (150 mg total) by mouth at bedtime. 270 tablet 1   Vitamin D, Ergocalciferol, (DRISDOL) 1.25 MG (50000 UNIT) CAPS capsule Take 50,000 Units by mouth once a week.     buPROPion (WELLBUTRIN XL) 150 MG 24 hr tablet Take 1 tablet (150 mg total) by mouth daily. 90 tablet 1   buPROPion ER (WELLBUTRIN SR) 100 MG 12 hr tablet Take 1 tablet (100 mg total) by mouth daily in the afternoon. 90 tablet 1   [START ON 05/14/2021] Eszopiclone 3 MG TABS Take 1  tablet (3 mg total) by mouth at bedtime. Take immediately before bedtime 30 tablet 5   HYDROcodone-acetaminophen (NORCO/VICODIN) 5-325 MG tablet Take 1 tablet by mouth every 4 (four) hours as needed for severe pain. (Patient not taking: Reported on 09/01/2020) 12 tablet 0   hyoscyamine (LEVSIN SL) 0.125 MG SL tablet Place 1 tablet (0.125 mg total) under the tongue every 6 (six) hours as needed for cramping. (Patient not taking: Reported on 12/17/2020) 30 tablet 1   lubiprostone (AMITIZA) 8 MCG capsule Take 1 capsule (8 mcg total) by mouth 2 (two) times daily with a meal. (Patient not taking: Reported on 07/12/2020) 60 capsule 2   promethazine (PHENERGAN) 25 MG tablet Take by mouth.     sucralfate (CARAFATE) 1 g tablet Take 1 tablet (1 g total) by mouth 4 (four) times daily as needed. (Patient not taking: No sig reported) 120 tablet 5   No current facility-administered medications for this visit.    Medication Side Effects: None  Allergies:  Allergies  Allergen Reactions   Bentyl [Dicyclomine] Other (See Comments)    Patient states it caused leg and arm spasms.   Ivp Dye [Iodinated Diagnostic Agents] Anaphylaxis   Antihistamines, Loratadine-Type Other (See Comments)    Sleepy   Ciprofloxacin Nausea And Vomiting   Dymista [Azelastine-Fluticasone] Other (See Comments)    Heachache   Gluten Meal    Lortab [Hydrocodone-Acetaminophen] Nausea And Vomiting   Macrobid [Nitrofurantoin] Other (See Comments)    Unknown   Montelukast Other (See Comments)    Sleep   Nsaids     Pt can tolerate small amounts of Aleve "mess my stomach up, have had ulcer in the past"   Nurtec [Rimegepant Sulfate] Nausea Only   Other     Patient not sure about these allergies  Darvocet-   Mermantin- "makes me sick on my stomach"  Bio Citrate Calcium with Vitamin D- "hurt stomach"      Pepcid [Famotidine] Other (See Comments)    Headache   Prednisone     "makes me hyper and gives me a headache"   Prilosec  [Omeprazole] Other (See Comments)    Headache   Rosuvastatin Calcium Other (See Comments)    Headaches.   Vantin [Cefpodoxime] Nausea And Vomiting   Zofran [Ondansetron] Other (See Comments)    Causes headaches per pt    Past Medical History:  Diagnosis Date   Allergic rhinitis    Chronic kidney disease    Decreased bone density    Depression    GERD (gastroesophageal reflux disease)    Hypothyroid    Iron deficiency anemia    Low vitamin D level    Migraine    Osteoporosis     Past Medical History, Surgical history, Social history, and Family history were reviewed and updated as appropriate.   Please see review of systems for further details on the patient's review from today.   Objective:   Physical Exam:  There were  no vitals taken for this visit.  Physical Exam Constitutional:      General: She is not in acute distress. Musculoskeletal:        General: No deformity.  Neurological:     Mental Status: She is alert and oriented to person, place, and time.     Coordination: Coordination normal.  Psychiatric:        Attention and Perception: Attention and perception normal. She does not perceive auditory or visual hallucinations.        Mood and Affect: Mood is anxious and depressed. Affect is not labile, blunt, angry or inappropriate.        Speech: Speech normal.        Behavior: Behavior normal.        Thought Content: Thought content normal. Thought content is not paranoid or delusional. Thought content does not include homicidal or suicidal ideation. Thought content does not include homicidal or suicidal plan.        Cognition and Memory: Cognition and memory normal.        Judgment: Judgment normal.     Comments: Insight intact    Lab Review:     Component Value Date/Time   NA 139 03/08/2020 2241   K 4.0 03/08/2020 2241   CL 109 03/08/2020 2241   CO2 22 03/08/2020 2241   GLUCOSE 97 03/08/2020 2241   BUN 17 03/08/2020 2241   CREATININE 0.96 03/08/2020  2241   CALCIUM 9.3 03/08/2020 2241   PROT 6.7 03/08/2020 2241   ALBUMIN 4.3 03/08/2020 2241   AST 20 03/08/2020 2241   ALT 14 03/08/2020 2241   ALKPHOS 78 03/08/2020 2241   BILITOT 0.5 03/08/2020 2241   GFRNONAA >60 03/08/2020 2241   GFRAA >60 03/08/2020 2241       Component Value Date/Time   WBC 3.8 (L) 03/08/2020 2241   RBC 4.92 03/08/2020 2241   HGB 14.6 03/08/2020 2241   HCT 43.5 03/08/2020 2241   PLT 141 (L) 03/08/2020 2241   MCV 88.4 03/08/2020 2241   MCH 29.7 03/08/2020 2241   MCHC 33.6 03/08/2020 2241   RDW 12.5 03/08/2020 2241   LYMPHSABS 0.9 01/20/2020 0354   MONOABS 0.3 01/20/2020 0354   EOSABS 0.1 01/20/2020 0354   BASOSABS 0.0 01/20/2020 0354    No results found for: POCLITH, LITHIUM   No results found for: PHENYTOIN, PHENOBARB, VALPROATE, CBMZ   .res Assessment: Plan:   Pt seen for 30 minutes and time spent discussing potential benefits, risks, and side effects of Sertraline for anxiety and mild depressive s/s. Discussed that Sertraline can be combined with Wellbutrin. Pt agrees to trial of Sertraline. She reports that she prefers to start with low dose and gradually increase if needed.  Will start Sertraline 50 mg 1/2 tablet po qd for 2-4 days, then increase to 1 tablet daily.  Continue Wellbutrin XL 150 mg daily for depression. Continue Wellbutrin SR 100 mg daily in the afternoon for depression.  Continue Lorazepam 0.5 mg as needed for anxiety.  Continue Lunesta 3 mg po QHS for insomnia.  Pt to follow-up in 6 weeks or sooner if clinically indicated.  Patient advised to contact office with any questions, adverse effects, or acute worsening in signs and symptoms.  Toni was seen today for anxiety.  Diagnoses and all orders for this visit:  Anxiety -     sertraline (ZOLOFT) 50 MG tablet; Take 1/2 tab po qd x 2-4 days, then 1 tab po qd  Moderate episode of  recurrent major depressive disorder (HCC) -     sertraline (ZOLOFT) 50 MG tablet; Take 1/2 tab po  qd x 2-4 days, then 1 tab po qd -     buPROPion ER (WELLBUTRIN SR) 100 MG 12 hr tablet; Take 1 tablet (100 mg total) by mouth daily in the afternoon. -     buPROPion (WELLBUTRIN XL) 150 MG 24 hr tablet; Take 1 tablet (150 mg total) by mouth daily.  Primary insomnia -     Eszopiclone 3 MG TABS; Take 1 tablet (3 mg total) by mouth at bedtime. Take immediately before bedtime    Please see After Visit Summary for patient specific instructions.  Future Appointments  Date Time Provider Department Center  06/20/2021  1:15 PM Corie Chiquito, PMHNP [facility] None  08/22/2021  1:30 PM Lomax, Amy, NP [facility] None    No orders of the defined types were placed in this encounter.   -------------------------------

## 2021-05-13 ENCOUNTER — Telehealth: Payer: Self-pay | Admitting: Psychiatry

## 2021-05-13 NOTE — Telephone Encounter (Signed)
LVM with info and to rtc with questions 

## 2021-05-13 NOTE — Telephone Encounter (Signed)
Please find out more. How long was she on the 1/2, the whole, and back at the 1/2 tab? Does she think the med caused chest pain? Is the chest pain from anxiety and the medication helped? What is she specifically requesting? Is she requesting change in treatment to another med or wanting to stay at lower dose?

## 2021-05-13 NOTE — Telephone Encounter (Signed)
Pt LVM stating that last week, Almira Coaster started her on Zoloft.  She said she took 1/2, then one day she took 1, then she went back to 1/2, but it is causing her bad headaches.  It stopped the chest pain, but even after taking her migraine headache meds she still has a headache.  Next appt 1/16

## 2021-05-13 NOTE — Telephone Encounter (Signed)
Please review

## 2021-05-13 NOTE — Telephone Encounter (Signed)
Pt stated she took 1/2 tab for 4 days,increased to 1 tab for 1 day,then went back to a half thinking it would help the headaches.The headaches started day 3 while still taking 1/2 tab.The chest pain came from anxiety.She informed while taking ativan it helped anxiety and chest pain.I informed her you sent a rx on 12/2 and asked if she got it.She was not aware.She wants to know if she can stop Zoloft and continue ativan.

## 2021-05-18 ENCOUNTER — Telehealth: Payer: Self-pay | Admitting: Family Medicine

## 2021-05-18 MED ORDER — TOPIRAMATE 50 MG PO TABS: 150.0000 mg | ORAL_TABLET | Freq: Every day | ORAL | 1 refills | Status: DC

## 2021-05-18 NOTE — Telephone Encounter (Signed)
Refill has been sent to the pharmacy requested for 3 mth supply

## 2021-05-18 NOTE — Telephone Encounter (Signed)
Pt states since she is 65 years old her insurance has changed.  Pt is now asking for a 90 day supply of topiramate (TOPAMAX) 50 MG tablet  [facility] [phone]

## 2021-05-25 ENCOUNTER — Telehealth: Payer: Self-pay | Admitting: Psychiatry

## 2021-05-25 ENCOUNTER — Other Ambulatory Visit: Payer: Self-pay | Admitting: Psychiatry

## 2021-05-25 DIAGNOSIS — F5101 Primary insomnia: Secondary | ICD-10-CM

## 2021-05-25 MED ORDER — LORAZEPAM 0.5 MG PO TABS: 0.5000 mg | ORAL_TABLET | Freq: Three times a day (TID) | ORAL | 0 refills | Status: DC

## 2021-05-25 MED ORDER — ESZOPICLONE 3 MG PO TABS: 3.0000 mg | ORAL_TABLET | Freq: Every day | ORAL | 0 refills | Status: DC

## 2021-05-25 NOTE — Telephone Encounter (Signed)
Pt called and said that her husband retired and and they had to get prescriptions at [facility]. However her medicines lunesta and ativan are not working because the [employer] uses a Building services engineer. So she just picked up 30 pills of the lunesta and ativan and it doesm't work. She would [facility] those [facility] because the manufactor is right. She will pay out of pocket for them

## 2021-05-25 NOTE — Telephone Encounter (Signed)
Pt reports since changing pharmacies she has been unable to sleep with that Lunesta, the pharmacy told her it was likely the manufacturer. She is asking for her Lunesta and Lorazepam be changed back to [facility].  Last refill Lunesta 12/17 #30 Last refill Lorazepam 12/02 #30   Can she get those even though they would be early?

## 2021-05-25 NOTE — Telephone Encounter (Signed)
Sent Lunesta 3 mg #15 and lorazepam 0.5 mg #30 to [facility] for the reasons requested.

## 2021-05-26 ENCOUNTER — Telehealth: Payer: Self-pay | Admitting: Psychiatry

## 2021-05-26 DIAGNOSIS — F5101 Primary insomnia: Secondary | ICD-10-CM

## 2021-05-26 NOTE — Telephone Encounter (Signed)
Please review

## 2021-05-26 NOTE — Telephone Encounter (Signed)
Pt got script for Eszopiclone 3 mg tabs, but it was only for 12 pills.  Dr. Jennelle Human filled it.  She would like someone to call her back about why it wasn't for a full script.  She had issues with the medicine from a different manufacturer, so I guess she hasn't take then rest of the original script that was sent and needed a new script. She wants Shanda Bumps to advise when she returns from the holiday.  Next appt 1/16

## 2021-06-01 MED ORDER — ESZOPICLONE 3 MG PO TABS
3.0000 mg | ORAL_TABLET | Freq: Every day | ORAL | 0 refills | Status: DC
Start: 2021-06-05 — End: 2021-06-20

## 2021-06-01 NOTE — Telephone Encounter (Signed)
[facility] and cancel [facility]ills on file for Lorazepam and Lunesta since it appears from the message she no longer wants these [facility]s [facility] and wishes to use [facility] because they use a different manufacturer. Please let her know that PDMP showed she had received 30 days worth [facility] on [facility]21 and this was likely why Dr. Jennelle Human sent in #12.

## 2021-06-01 NOTE — Telephone Encounter (Signed)
Please get more information and refer to other telephone call.

## 2021-06-01 NOTE — Telephone Encounter (Signed)
LVM to rtc 

## 2021-06-02 NOTE — Telephone Encounter (Signed)
Rx cancelled.

## 2021-06-20 ENCOUNTER — Encounter: Payer: Self-pay | Admitting: Psychiatry

## 2021-06-20 ENCOUNTER — Other Ambulatory Visit: Payer: Self-pay

## 2021-06-20 ENCOUNTER — Ambulatory Visit (INDEPENDENT_AMBULATORY_CARE_PROVIDER_SITE_OTHER): Payer: Medicare Other | Admitting: Psychiatry

## 2021-06-20 DIAGNOSIS — F5101 Primary insomnia: Secondary | ICD-10-CM | POA: Diagnosis not present

## 2021-06-20 DIAGNOSIS — F331 Major depressive disorder, recurrent, moderate: Secondary | ICD-10-CM

## 2021-06-20 DIAGNOSIS — F4321 Adjustment disorder with depressed mood: Secondary | ICD-10-CM | POA: Diagnosis not present

## 2021-06-20 MED ORDER — BUPROPION HCL ER (XL) 150 MG PO TB24: 150.0000 mg | ORAL_TABLET | Freq: Every day | ORAL | 1 refills | Status: DC

## 2021-06-20 MED ORDER — BUPROPION HCL ER (SR) 100 MG PO TB12: 100.0000 mg | ORAL_TABLET | Freq: Every day | ORAL | 1 refills | Status: DC

## 2021-06-20 MED ORDER — LORAZEPAM 0.5 MG PO TABS: ORAL_TABLET | ORAL | 5 refills | Status: DC

## 2021-06-20 MED ORDER — ESZOPICLONE 3 MG PO TABS: 3.0000 mg | ORAL_TABLET | Freq: Every day | ORAL | 5 refills | Status: DC

## 2021-06-20 NOTE — Progress Notes (Signed)
Sabrina Duke [id number] 06-26-1955 65 y.o.  Subjective:   Patient ID:  Sabrina Duke is a 66 y.o. (DOB [birth year]) female.  Chief Complaint:  Chief Complaint  Patient presents with   Follow-up    Depression, anxiety, insomnia    HPI Sabrina Duke presents to the office today for follow-up of depression, anxiety, and insomnia. She reports that taking Ativan once daily along with her other medications has been "perfect." She reports that she has only needed Ativan prn twice in one day on a couple of rare occasions. She reports that she was having chest pain and Ativan relieved this. She reports that her headaches have significantly improved. She reports that jaw pain has improved and is no longer clenching her teeth. Denies depressed mood. Denies irritability. Energy and motivation have been good. Concentration has been adequate. Denies anhedonia. Denies SI.   She reports that [facility] that was a different manufacturer and was unable to fall asleep until 4 am and had restless sleep. She reports the following night she was unable to sleep. She then had it filled again at [facility] and was able to sleep again without difficulty.   Husband was recently hospitalized for tracheitis. Husband is now retired and reports that he "stays busy" and is not inside the house much. She reports that their insurance changed.   Past medication trials: Wellbutrin- Effective, well tolerated Trintellix-GI side effects Pristiq- somewhat helpful Lexapro Celexa Prozac- Helped, caused headaches Cymbalta Sertraline Rexulti Vraylar-jittery, hallucinations Latuda-joint aches Abilify-weight gain Lamictal BuSpar- Weight gain Topamax Trazodone-excessive somnolence Lunesta Ambien-ineffective Adderall XR Ativan-Effective  PHQ2-9    Flowsheet Row Nutrition from 05/11/2020 in [facility]  PHQ-2 Total Score 0      Flowsheet Row ED from  08/30/2020 in [facility] EMERGENCY DEPARTMENT  C-SSRS RISK CATEGORY No Risk        Review of Systems:  Review of Systems  Gastrointestinal: Negative.   Musculoskeletal:  Negative for gait problem.  Neurological:  Negative for tremors.       Headaches have significantly improved. No longer having headaches daily.   Psychiatric/Behavioral:         Please refer to HPI   Maintains gluten free diet.   Medications: I have reviewed the patient's current medications.  Current Outpatient Medications  Medication Sig Dispense Refill   levothyroxine (SYNTHROID) 50 MCG tablet Take 50 mcg by mouth daily before breakfast.     Magnesium 200 MG TABS Take 200 mg by mouth daily.      promethazine (PHENERGAN) 25 MG tablet Take 1 tablet (25 mg total) by mouth every 6 (six) hours as needed for nausea or vomiting. 12 tablet 0   promethazine (PHENERGAN) 25 MG tablet Take by mouth.     SUMAtriptan (IMITREX) 100 MG tablet Take 1 tablet (100 mg total) by mouth once as needed for up to 1 dose for migraine. May repeat in 2 hours if headache persists or recurs. PLEASE KEEP UPCOMING APPT FOR CONTINUED REFILLS 10 tablet 3   topiramate (TOPAMAX) 50 MG tablet Take 3 tablets (150 mg total) by mouth at bedtime. 270 tablet 1   Vitamin D, Ergocalciferol, (DRISDOL) 1.25 MG (50000 UNIT) CAPS capsule Take 50,000 Units by mouth once a week.     buPROPion (WELLBUTRIN XL) 150 MG 24 hr tablet Take 1 tablet (150 mg total) by mouth daily. 90 tablet 1   buPROPion ER (WELLBUTRIN SR) 100 MG 12 hr tablet  Take 1 tablet (100 mg total) by mouth daily in the afternoon. 90 tablet 1   [START ON 07/04/2021] Eszopiclone 3 MG TABS Take 1 tablet (3 mg total) by mouth at bedtime. Take immediately before bedtime 30 tablet 5   [START ON 06/22/2021] LORazepam (ATIVAN) 0.5 MG tablet TAKE 1/2 TO ONE TABLET BY MOUTH EVERY 6 HOURS AS NEEDED FOR ANXIETY(DO NOT TAKE WITH LUNESTA) 30 tablet 5   naratriptan (AMERGE) 2.5 MG tablet Take one  tablet in AM and at bedtime for 5 days. (Patient taking differently: as needed. Take one tablet in AM and at bedtime for 5 days.) 10 tablet 0   sucralfate (CARAFATE) 1 g tablet Take 1 tablet (1 g total) by mouth 4 (four) times daily as needed. (Patient not taking: No sig reported) 120 tablet 5   No current facility-administered medications for this visit.    Medication Side Effects: None  Allergies:  Allergies  Allergen Reactions   Bentyl [Dicyclomine] Other (See Comments)    Patient states it caused leg and arm spasms.   Ivp Dye [Iodinated Contrast Media] Anaphylaxis   Antihistamines, Loratadine-Type Other (See Comments)    Sleepy   Ciprofloxacin Nausea And Vomiting   Dymista [Azelastine-Fluticasone] Other (See Comments)    Heachache   Gluten Meal    Lortab [Hydrocodone-Acetaminophen] Nausea And Vomiting   Macrobid [Nitrofurantoin] Other (See Comments)    Unknown   Montelukast Other (See Comments)    Sleep   Nsaids     Pt can tolerate small amounts of Aleve "mess my stomach up, have had ulcer in the past"   Nurtec [Rimegepant Sulfate] Nausea Only   Other     Patient not sure about these allergies  Darvocet-   Mermantin- "makes me sick on my stomach"  Bio Citrate Calcium with Vitamin D- "hurt stomach"      Pepcid [Famotidine] Other (See Comments)    Headache   Prednisone     "makes me hyper and gives me a headache"   Prilosec [Omeprazole] Other (See Comments)    Headache   Rosuvastatin Calcium Other (See Comments)    Headaches.   Vantin [Cefpodoxime] Nausea And Vomiting   Zofran [Ondansetron] Other (See Comments)    Causes headaches per pt    Past Medical History:  Diagnosis Date   Allergic rhinitis    Chronic kidney disease    Decreased bone density    Depression    GERD (gastroesophageal reflux disease)    Hypothyroid    Iron deficiency anemia    Low vitamin D level    Migraine    Osteoporosis     Past Medical History, Surgical history, Social  history, and Family history were reviewed and updated as appropriate.   Please see review of systems for further details on the patient's review from today.   Objective:   Physical Exam:  BP 120/81    Pulse 70    Wt 138 lb (62.6 kg)    BMI 23.69 kg/m   Physical Exam Constitutional:      General: She is not in acute distress. Musculoskeletal:        General: No deformity.  Neurological:     Mental Status: She is alert and oriented to person, place, and time.     Coordination: Coordination normal.  Psychiatric:        Attention and Perception: Attention and perception normal. She does not perceive auditory or visual hallucinations.        Mood and Affect:  Mood normal. Mood is not anxious or depressed. Affect is not labile, blunt, angry or inappropriate.        Speech: Speech normal.        Behavior: Behavior normal.        Thought Content: Thought content normal. Thought content is not paranoid or delusional. Thought content does not include homicidal or suicidal ideation. Thought content does not include homicidal or suicidal plan.        Cognition and Memory: Cognition and memory normal.        Judgment: Judgment normal.     Comments: Insight intact    Lab Review:     Component Value Date/Time   NA 139 03/08/2020 2241   K 4.0 03/08/2020 2241   CL 109 03/08/2020 2241   CO2 22 03/08/2020 2241   GLUCOSE 97 03/08/2020 2241   BUN 17 03/08/2020 2241   CREATININE 0.96 03/08/2020 2241   CALCIUM 9.3 03/08/2020 2241   PROT 6.7 03/08/2020 2241   ALBUMIN 4.3 03/08/2020 2241   AST 20 03/08/2020 2241   ALT 14 03/08/2020 2241   ALKPHOS 78 03/08/2020 2241   BILITOT 0.5 03/08/2020 2241   GFRNONAA >60 03/08/2020 2241   GFRAA >60 03/08/2020 2241       Component Value Date/Time   WBC 3.8 (L) 03/08/2020 2241   RBC 4.92 03/08/2020 2241   HGB 14.6 03/08/2020 2241   HCT 43.5 03/08/2020 2241   PLT 141 (L) 03/08/2020 2241   MCV 88.4 03/08/2020 2241   MCH 29.7 03/08/2020 2241   MCHC  33.6 03/08/2020 2241   RDW 12.5 03/08/2020 2241   LYMPHSABS 0.9 01/20/2020 0354   MONOABS 0.3 01/20/2020 0354   EOSABS 0.1 01/20/2020 0354   BASOSABS 0.0 01/20/2020 0354    No results found for: POCLITH, LITHIUM   No results found for: PHENYTOIN, PHENOBARB, VALPROATE, CBMZ   .res Assessment: Plan:   Will continue current plan of care since target signs and symptoms are well controlled without any tolerability issues. Discussed trying to use lowest possible effective dose of Ativan to minimize risk of dependence and tolerance. Discussed that she may try taking 1/2 tab when anxiety is more stable or using only as needed.  Continue Wellbutrin XL 150 mg po q am for depression.  Continue Wellbutrin SR 100 mg in the afternoon for depression. Continue Lunesta 3 mg po QHS for insomnia.  She has transferred all scripts back to [facility] and plans to continue to use them as her pharmacy.  Pt to follow-up in 6 months or sooner if clinically indicated.  Patient advised to contact office with any questions, adverse effects, or acute worsening in signs and symptoms.  Sabrina Duke was seen today for follow-up.  Diagnoses and all orders for this visit:  Moderate episode of recurrent major depressive disorder (HCC) -     buPROPion (WELLBUTRIN XL) 150 MG 24 hr tablet; Take 1 tablet (150 mg total) by mouth daily. -     buPROPion ER (WELLBUTRIN SR) 100 MG 12 hr tablet; Take 1 tablet (100 mg total) by mouth daily in the afternoon.  Primary insomnia -     Eszopiclone 3 MG TABS; Take 1 tablet (3 mg total) by mouth at bedtime. Take immediately before bedtime  Grief reaction -     LORazepam (ATIVAN) 0.5 MG tablet; TAKE 1/2 TO ONE TABLET BY MOUTH EVERY 6 HOURS AS NEEDED FOR ANXIETY(DO NOT TAKE WITH LUNESTA)     Please see After Visit Summary for  patient specific instructions.  Future Appointments  Date Time Provider Department Center  08/22/2021  1:30 PM Shawnie DapperLomax, Amy, NP [facility] None  12/19/2021  1:45  PM Corie Chiquitoarter, Khamarion Bjelland, PMHNP [facility] None    No orders of the defined types were placed in this encounter.   -------------------------------

## 2021-06-27 ENCOUNTER — Telehealth: Payer: Self-pay

## 2021-06-27 NOTE — Telephone Encounter (Signed)
We received a fax from [facility] informing us that sumatriptan 100mg  is subject to a quantity limit and will not provide more than 12 tablets every 30 days. Prescription on file is only written for 10 tablets per 30 days.

## 2021-07-13 ENCOUNTER — Telehealth: Payer: Self-pay | Admitting: Family Medicine

## 2021-07-13 DIAGNOSIS — G43709 Chronic migraine without aura, not intractable, without status migrainosus: Secondary | ICD-10-CM

## 2021-07-13 MED ORDER — SUMATRIPTAN SUCCINATE 100 MG PO TABS: 100.0000 mg | ORAL_TABLET | Freq: Once | ORAL | 0 refills | Status: DC | PRN

## 2021-07-13 NOTE — Telephone Encounter (Signed)
Pt is needing a refill on her SUMAtriptan (IMITREX) 100 MG tablet sent in to the [facility] in [place]

## 2021-07-13 NOTE — Telephone Encounter (Signed)
Reviewed pt chart. Last seen 06/15/20, next follow up 08/22/21. Last refill for sumatriptan sent to pharmacy 04/14/21 #10, 3 refills. Should have enough until 08/12/21 if doing #10/30days.  [facility] called [facility] at [phone]. Last refilled rx 11/10 #9, 12/12 #12, 01/10 #12.   I spoke w/ Amy L,NP. Ok to call in refill for #12/30days. I e-scribed to pharmacy.

## 2021-07-19 ENCOUNTER — Telehealth: Payer: BC Managed Care – PPO | Admitting: Psychiatry

## 2021-08-22 ENCOUNTER — Encounter: Payer: Self-pay | Admitting: Family Medicine

## 2021-08-22 ENCOUNTER — Other Ambulatory Visit: Payer: Self-pay

## 2021-08-22 ENCOUNTER — Ambulatory Visit: Payer: Medicare Other | Admitting: Family Medicine

## 2021-08-22 VITALS — BP 123/76 | HR 75 | Ht 64.0 in | Wt 142.0 lb

## 2021-08-22 DIAGNOSIS — G43709 Chronic migraine without aura, not intractable, without status migrainosus: Secondary | ICD-10-CM

## 2021-08-22 MED ORDER — SUMATRIPTAN SUCCINATE 100 MG PO TABS: 100.0000 mg | ORAL_TABLET | Freq: Once | ORAL | 11 refills | Status: DC | PRN

## 2021-08-22 MED ORDER — TOPIRAMATE 50 MG PO TABS: 150.0000 mg | ORAL_TABLET | Freq: Every day | ORAL | 3 refills | Status: DC

## 2021-08-22 NOTE — Patient Instructions (Signed)
Below is our plan: ? ?We will continue topiramate 150mg  at bedtime and sumatriptan as needed. Consider sleep eval versus starting Qulipta if headaches do not continue to improve.  ? ?Please make sure you are staying well hydrated. I recommend 50-60 ounces daily. Well balanced diet and regular exercise encouraged. Consistent sleep schedule with 6-8 hours recommended.  ? ?Please continue follow up with care team as directed.  ? ?Follow up with me in 1 year  ? ?You may receive a survey regarding today's visit. I encourage you to leave honest feed back as I do use this information to improve patient care. Thank you for seeing me today!  ? ? ?

## 2021-08-22 NOTE — Progress Notes (Signed)
? ? ?PATIENT: Sabrina BondSherry D Duke ?DOB: 08/11/1955 ? ?REASON FOR VISIT: follow up ?HISTORY FROM: patient ? ?Chief Complaint  ?Patient presents with  ? Follow-up  ?  Rm 1, alone. Here for yearly migraine f/u. Pt reports migraines are the same since last ov. Taking ativan last 3 months for anxiety and believes it has helped HA. Has had anxiety attacks after loosing 3 brother last 4 years.   ?  ? ?HISTORY OF PRESENT ILLNESS: ? ?08/22/21 ALL: ?Sabrina Duke returns for follow up for migraines. She continues topiramate 150mg  daily at bedtime and sumatriptan 100mg  as needed. She feels that migraines are about the same. She usually has about 8 migraine days per month. Migraines worsen during season changes. Sumatriptan usually works well. Occasionally she will take a round of Amerge that will break a bad cycle. She has recently started Ativan for anxiety and feels this has helped migraines. She is followed closely by psychiatry. She does note that she snores. She will wake herself at times snoring or coughing. Most migraines come on through the day but 1-2 per month are present in the morning when she wakes. She does wake with dry mouth. She denies nocturia. Some daytime sleepiness but does not normally fall asleep. ESS 8.  ? ?06/15/20 ALL (Mychart): ?Sabrina BondSherry D Duke is a 66 y.o. female here today for follow up for migraines. [facility] She continues topiramate 150mg  at bedtime and sumatriptan for abortive therapy. She was unable to tolerate Amovig due to constipation. Emgality or Ajovy was not started do to fears of side effects.  ?  ?She reports that headaches have improved over the past 3-4 months. She was diagnosed with Celiac disease and has made significant diet changes. She does not eat gluten. She has about 8 headache days per month. Usually easily aborted with sumatriptan. She is no longer needing a full prescription of sumatriptan. She has had difficulty with constipation and is followed closely by  GI.  ?  ?Tried and failed: Topiramate (on now), Inderal, gabapentin, nortriptyline, Depakote, Maxalt, Emerge, Botox, Amovig (constipation), sumatriptan (on now), Nurtec (nausea) ?  ?09/09/19 MM: ?Sabrina Duke is a 66 year old female with a history of migraine headaches. She returns today for follow-up.  She reports that she never tried Ajovy. Reports in the last month she has had 15 headaches.  She states prior to she was having 7-8 headaches a month.  She states that sumatriptan normally resolves her headache in about 30 minutes.  Her headaches always occur on the left side.  She does have photophobia, phonophobia, nausea and vomiting.  She has Phenergan for nausea. The patient reports that she never tried Ajovy because she was worried about the potential side effects.  She has continued on Topamax 150 mg daily. ?  ?Tried and failed: Inderal, gabapentin, nortriptyline, Depakote, Maxalt, Emerge, Botox ? ?02/06/2019 ALL:  ?Sabrina Duke is a 66 y.o. female here today for follow up for migraines. She continues to have regular headaches with migrainous features of pounding, nausea, light and sound sensitivity. She has been seen 4 times this month for acute management. She was started on Amovig at last visit in 11/2018. She was not able to tolerate this due to increased constipation. She did not feel that it helped headache. She continues topiramate 150mg  at bedtime. She has been unable to tolerate Inderal, gabapentin, nortriptyline, Depakote, and Imitrex in the past. She has used Maxalt and Emerge for abortive therapy but is not sure  they help. She also uses phenergan and compazine for nausea as needed.  ? ?11/12/2018 ALL: ?Sabrina Duke is a 66 y.o. female here today for follow up for migraines.  She continues to have regular migraines.  She states that she is going through a full prescription of Maxalt every month.  She does feel that Maxalt tends to help abort migraine.  She does continue topiramate 150 mg at bedtime without  adverse effects.  She is uncertain if this is helping.  She reports that migraines are typical to those in the past.  She has no new symptoms.  She has tried multiple medications in the past but has never taken Aimovig. ?  ?02/26/2018 MM: ?Sabrina Duke is a 66 year old female with a history of migraine headaches.  She returns today for follow-up.  He states that her headaches have remained relatively the same.  She states that she has 10 severe headaches a month but has at least 15 headache days a month.  Her headaches always occur on the left side.  She does have photophobia and phonophobia as well as nausea and vomiting.  She takes Maxalt but it does not always resolve the headache.  She typically has to take Phenergan and occasionally Compazine to make her go to sleep for headache to resolve.  In the past she has tried multiple medications including  Inderal, Depakote, gabapentin, nortriptyline and Botox without benefit.  She returns today for evaluation. ?  ?HISTORY Sabrina Duke is a 66 y.o. female here as a referral from Dr. Clarisse Gouge for migraines.  Past medical history of migraines, chronic low back pain, depression.  She has migraines for decades. She uses 12 maxalt a month, does not use Tylenol or ibuprofen or other medications. She takes Demerol for severe headaches. In a month she has 20 headache days a month on average and 12 are migrainous. Barometric pressure is a trigger. No known other triggers, maybe stress or sleeping. She endorses photophobia/phonophobia. Migraines can last > 24 hours. No medication overuse. No aura.  Headaches are unilateral, pulsating and pounding. Can be severe. No other focal neurologic deficits, associated symptoms, inciting events or modifiable factors. ?  ?Numbness in the leg started 2 months ago, hurts more with leg in a recliner, tingling from the knee to the toe. She has spasms in the back, radicular symptoms, bending over makes it worse with spasms. She has tried conservative  measures for 2 months, heat, stretching, exercise, PT, NSAIDs. No relief. Has weakness. It is worsening slowly. No inciting events or trauma. ?  ?Reviewed notes, labs and imaging from outside physicians, which showed: ?  ?2013 Myelogram: FINDINGS:  AGAIN NOTED PATIENT HAS 6 LUMBAR VERTEBRAL BODIES. ?THERE IS A MINIMAL VENTRAL DEFECT AT THE LEVEL OF L5/6 SEEN ON ?LATERAL FILM. THE NERVE ROOTS ARE WELL FILLED WITHOUT EVIDENCE OF ?COMPRESSION OR DISPLACEMENT. THE CONUS IS NORMAL IN APPEARANCE. ? ?CONCLUSIONS: ? ?1.  SIX LUMBAR VERTEBRAL BODIES. ?2.  NO EVIDENCE OF NERVE ROOT COMPRESSION OR DISPLACEMENT. ?3.  SMALL VENTRAL BULGE AT L5/6. ?APPROVING MD: Loreli Slot M ?  ?Reviewed prior notes, patient was seen in the emergency room in November 2015 complaining of intermittent severe migraine headaches.  Localized in her left temple and left cheek described as throbbing.  She reported nausea, vomiting, blurred vision, photophobia, phonophobia.  She tried Demerol and Compazine with no relief.  Felt as though there was fluid in her ears.  Headaches improved after IV fluids and medications and she was  discharged home. ?  ? ?REVIEW OF SYSTEMS: Out of a complete 14 system review of symptoms, the patient complains only of the following symptoms, dizziness, headache, snoring, and all other reviewed systems are negative. ? ?Results of the Epworth flowsheet 08/22/2021  ?Sitting and reading 1  ?Watching TV 2  ?Sitting, inactive in a public place (e.g. a theatre or a meeting) 1  ?As a passenger in a car for an hour without a break 0  ?Lying down to rest in the afternoon when circumstances permit 2  ?Sitting and talking to someone 1  ?Sitting quietly after a lunch without alcohol 1  ?In a car, while stopped for a few minutes in traffic 0  ?Total score 8  ?  ? ?ALLERGIES: ?Allergies  ?Allergen Reactions  ? Bentyl [Dicyclomine] Other (See Comments)  ?  Patient states it caused leg and arm spasms.  ? Ivp Dye [Iodinated Contrast  Media] Anaphylaxis  ? Antihistamines, Loratadine-Type Other (See Comments)  ?  Sleepy  ? Ciprofloxacin Nausea And Vomiting  ? Dymista [Azelastine-Fluticasone] Other (See Comments)  ?  Heachache  ? Gluten Me

## 2021-10-11 ENCOUNTER — Telehealth: Payer: Self-pay | Admitting: Family Medicine

## 2021-10-11 MED ORDER — NARATRIPTAN HCL 2.5 MG PO TABS: 2.5000 mg | ORAL_TABLET | ORAL | 0 refills | Status: DC | PRN

## 2021-10-11 NOTE — Addendum Note (Signed)
Addended byShawnie Dapper L on: 10/11/2021 04:20 PM ? ? Modules accepted: Orders ? ?

## 2021-10-11 NOTE — Telephone Encounter (Signed)
Per Amy, NP: "Amerge sent to [facility]. Please remind her not to take more than two total doses of amerge AND/OR sumatriptan in 24 hours." ? ?I called patient. I discussed this with her. She verbalized understanding to not take more than two total doses of amerge and/or sumatriptan in 24 hours. ?

## 2021-10-11 NOTE — Telephone Encounter (Signed)
Called pt. She has had current migraine for about 10 days now. She has tried sumatriptan. Has taken 7 doses in the last 10 days. Last dose taken about 10 min ago. Helps migraines. I did educate pt that taking too many can cause medication overuse headaches, she verbalized understanding.  ? ?Currently sensitive to light, sound, smell with current migraine. Having a lot of allergies the last three weeks. Brings on migraines. Unable to take allergy meds d/t SE per pt (gives her leg cramps). Nasal spray gives her headaches.  ?Drinking plenty of fluids.  ? ?She is asking for AL,NP to call in Amerge 2.5mg , directions: Take one tablet in AM and at bedtime for 5 days. #10, 0 refills. Aware I will send to her for review and we will call back. ? ?Pharmacy: [facility] - [place], [place] - [address] ? ?

## 2021-10-11 NOTE — Telephone Encounter (Signed)
At 2:29 pt left vm asking if Amy, NP will call in Emerge dose pak to break her cycle of migraines, please call. ?

## 2021-11-07 ENCOUNTER — Other Ambulatory Visit: Payer: Self-pay | Admitting: Psychiatry

## 2021-11-07 DIAGNOSIS — F4321 Adjustment disorder with depressed mood: Secondary | ICD-10-CM

## 2021-11-20 IMAGING — CT CT ABD-PELV W/O CM
2 of 4 series · 16 of 46 positions shown, 18 images · non-contrast
Comparison: None.

CLINICAL DATA: Left lower quadrant pain, states she is being
treated for a UTI, since 01/12/2020

EXAM:
CT ABDOMEN AND PELVIS WITHOUT CONTRAST
TECHNIQUE: Multidetector CT imaging of the abdomen and pelvis was performed
following the standard protocol without IV contrast.

[Series 2: axial st · axial · 0.81mm/px · z∈[-424,-44]mm · 13 of 88 slices shown, 15 images]
[im 6/88  soft-tissue]
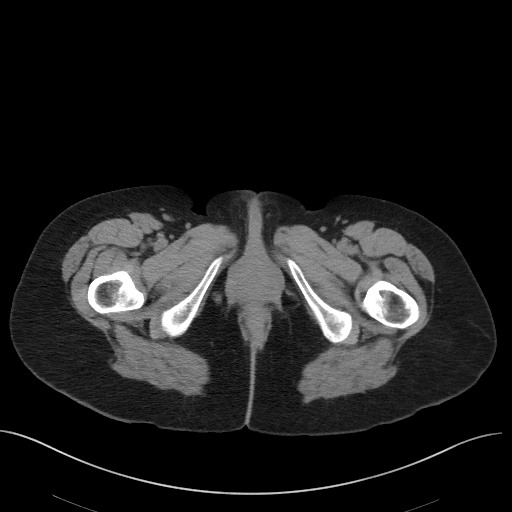
[im 6/88  bone]
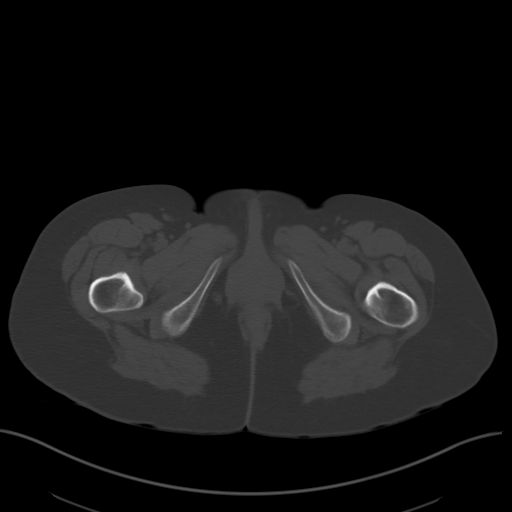
[im 11/88  soft-tissue]
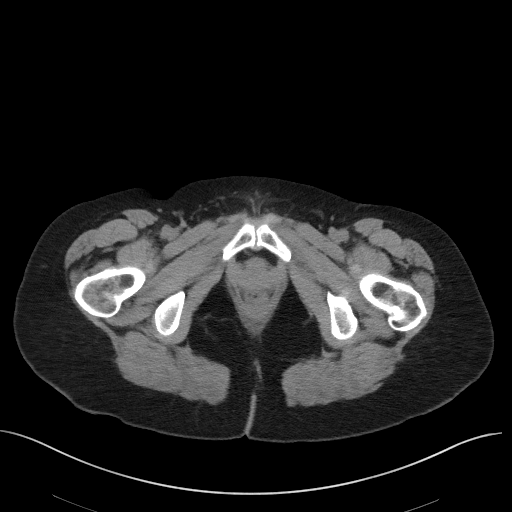
[im 17/88  soft-tissue]
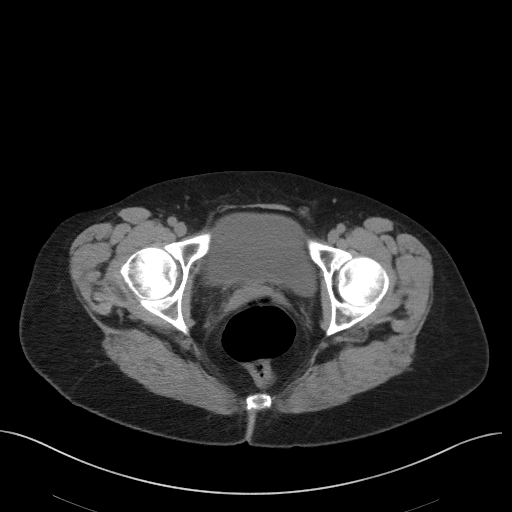
[im 28/88  soft-tissue]
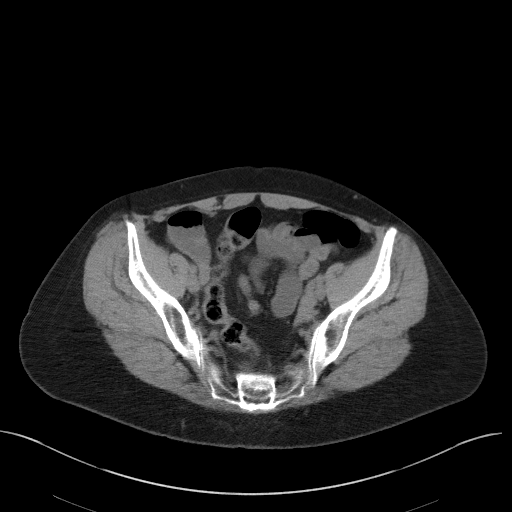
[im 33/88  soft-tissue]
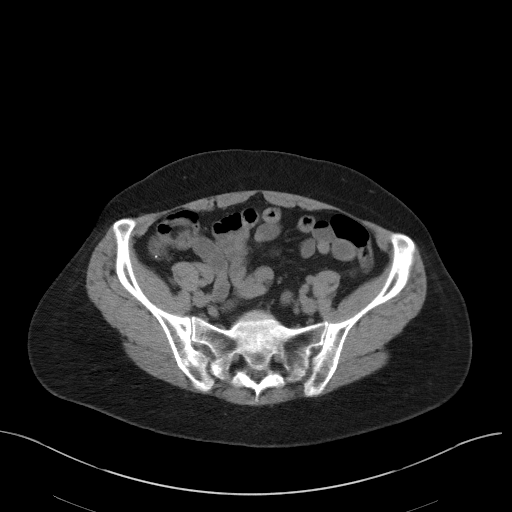
[im 39/88  soft-tissue]
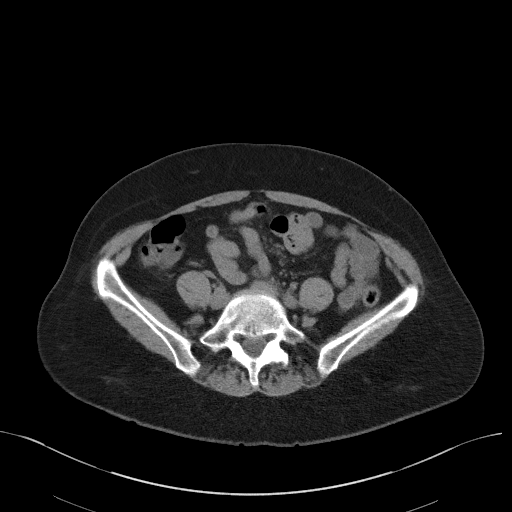
[im 44/88  soft-tissue]
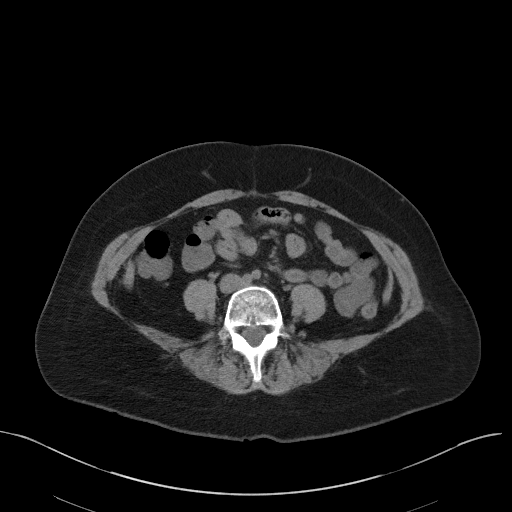
[im 49/88  soft-tissue]
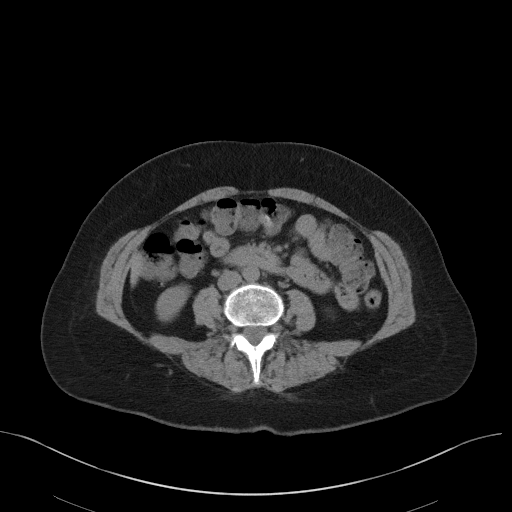
[im 55/88  soft-tissue]
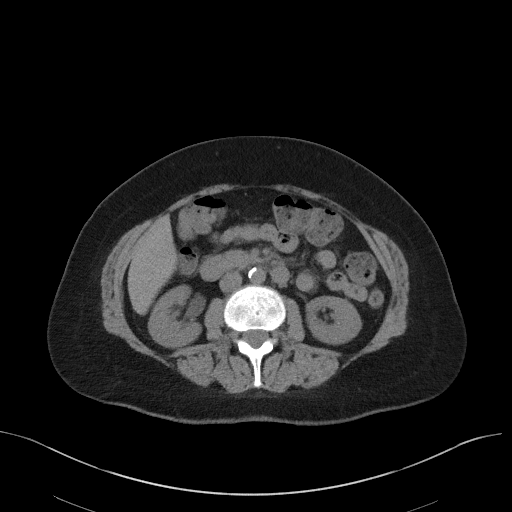
[im 55/88  bone]
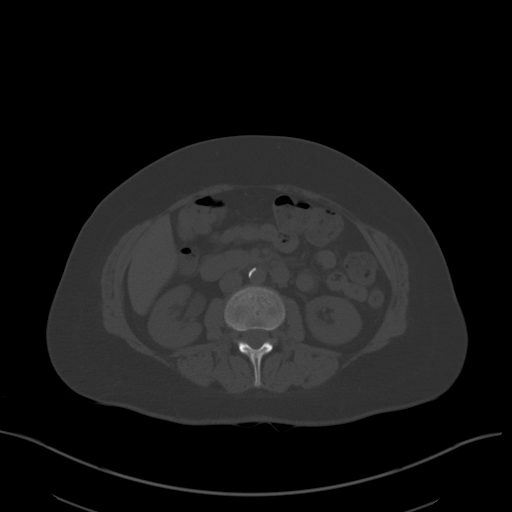
[im 60/88  soft-tissue]
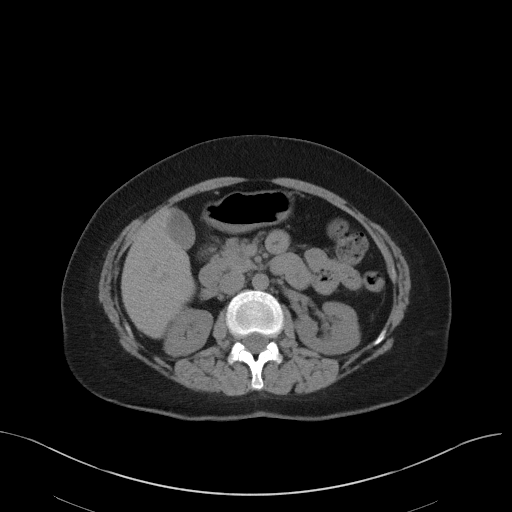
[im 71/88  soft-tissue]
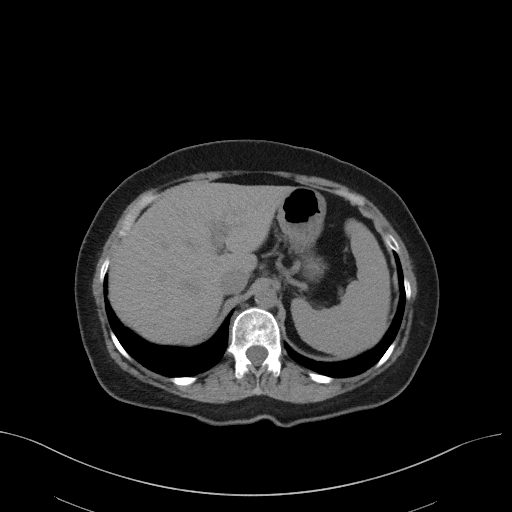
[im 77/88  soft-tissue]
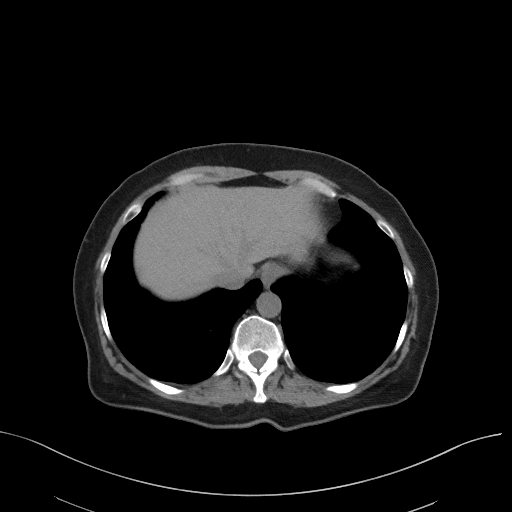
[im 82/88  soft-tissue]
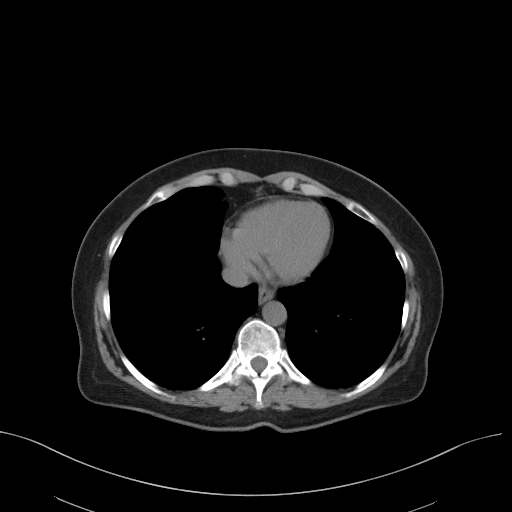

[Series 5: coronal st · coronal · 0.66mm/px · 3 of 86 slices shown]
[im 29/86  soft-tissue]
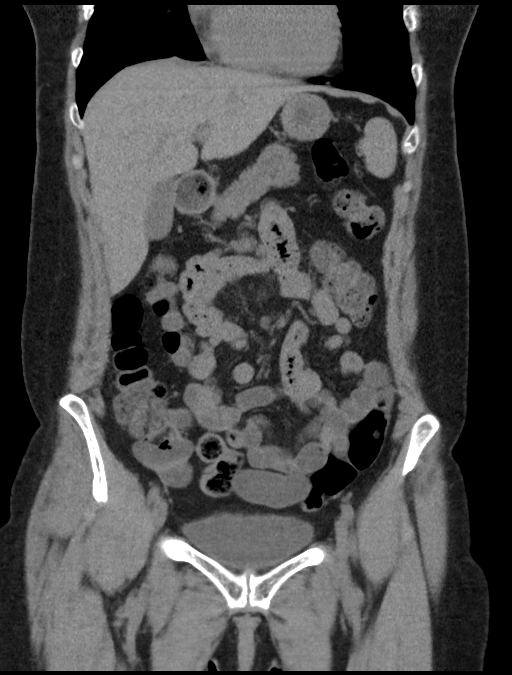
[im 38/86  soft-tissue]
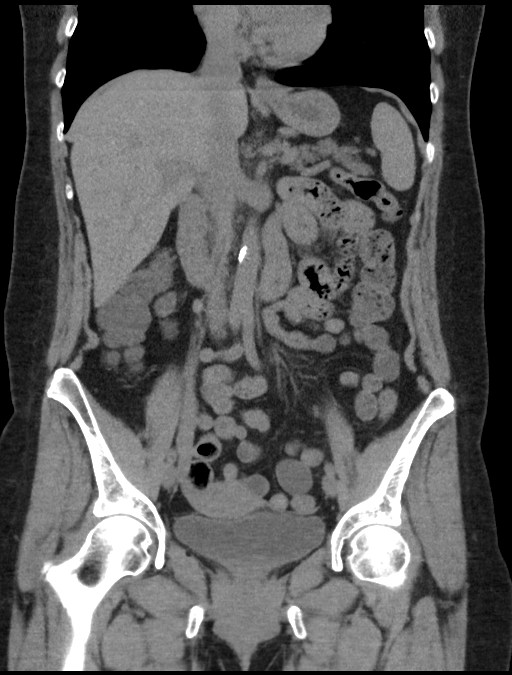
[im 48/86  soft-tissue]
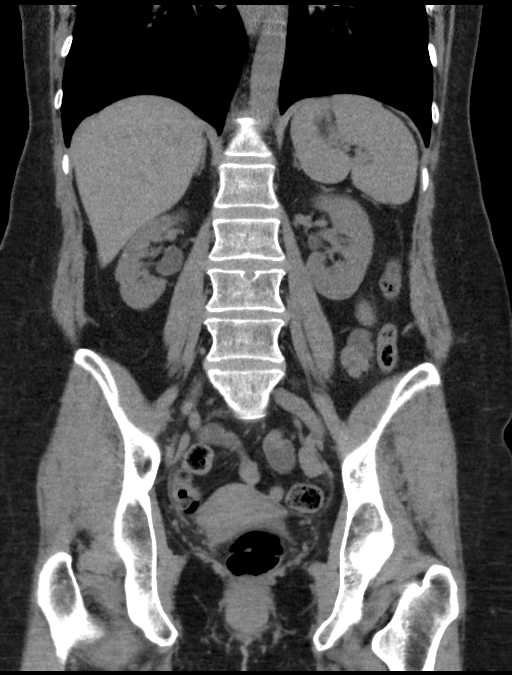

[16 of 46 positions shown; findings below may reference images not displayed]

FINDINGS: Lower chest: No acute abnormality.

Somewhat limited evaluation of the abdominal viscera given the lack
of IV contrast.

Hepatobiliary: No focal liver abnormality is seen. No gallstones,
gallbladder wall thickening, or biliary dilatation.

Pancreas: Unremarkable.  No surrounding inflammatory changes.

Spleen: Normal in size without focal abnormality.

Adrenals/Urinary Tract: Adrenal glands are unremarkable. Kidneys are
symmetric in size. No renal calculi identified. No hydronephrosis.
Urinary bladder is unremarkable.

Stomach/Bowel: Stomach is within normal limits. There is a mildly
dilated segment of small bowel in the low central abdomen with
smooth tapering on either side, possibly focal ileus. The bowel is
otherwise unremarkable.

Vascular/Lymphatic: Aortic atherosclerosis. No enlarged abdominal or
pelvic lymph nodes.

Reproductive: Uterus and bilateral adnexa are unremarkable.

Other: No abdominal wall hernia or abnormality. No abdominopelvic
ascites.

Musculoskeletal: No acute finding. There is very mild vertebral
height loss with a Schmorl's node at T12.
IMPRESSION: 1. There is a mildly dilated segment of small bowel in the low
central abdomen with smooth tapering on either side, possibly focal
ileus.
2. No other acute finding on a noncontrast scan.
3. Aortic atherosclerosis.

Aortic Atherosclerosis ([id number]-EK5.5).

## 2021-12-03 ENCOUNTER — Other Ambulatory Visit: Payer: Self-pay | Admitting: Psychiatry

## 2021-12-03 DIAGNOSIS — F4321 Adjustment disorder with depressed mood: Secondary | ICD-10-CM

## 2021-12-07 ENCOUNTER — Telehealth: Payer: Self-pay | Admitting: Family Medicine

## 2021-12-07 NOTE — Telephone Encounter (Signed)
Called the patient and she is still taking her preventative topamax 150 mg at bedtime. She is using the naratriptan which will relieve the migraine but it keeps coming back which is not typical. She complains that this hurts more in jaw, cheek bone, and across forehead which is also not typical for her type of migraines. Her migraines usually are on one side of the head. I offered sooner apt with amy on Tuesday and pt is not able to come because of prior commitments on that date. Her appt is scheduled for Thursday. Advised the patient to continue with her current medications. She is going to see her psych to ensure that anxiety is being controlled and she also is trying to get with her dentist. Pt was appreciative for the call back and asked if anything else opens up, let her know. She is just unable to do tues 11th.

## 2021-12-07 NOTE — Telephone Encounter (Signed)
Pt is asking if there is anything that can be suggested for relief from the daily migraines she is having for 2 months now. Pt reminded of her upcoming appointment

## 2021-12-09 ENCOUNTER — Telehealth: Payer: Self-pay | Admitting: Family Medicine

## 2021-12-09 NOTE — Telephone Encounter (Signed)
Pt is calling wanting Amy to call her something in to the pharmacy to help with her bad headaches.Pt said Amy have called her something in before but she didn't know the name of it right off hand.

## 2021-12-12 NOTE — Telephone Encounter (Signed)
Called pt back. She ended up going to ER for eval/treatment. They gave her toradol inj/phenergan. This helped alleviate headache. She is feeling better. She will keep appt 12/15/21 at 3pm. Nothing further needed.

## 2021-12-14 NOTE — Progress Notes (Unsigned)
PATIENT: Sabrina Duke DOB: [birth year]  REASON FOR VISIT: follow up HISTORY FROM: patient  No chief complaint on file.    HISTORY OF PRESENT ILLNESS:  12/14/21 ALL: Sabrina Duke returns for worsening migraines. She was last seen 08/2021 and having about 8 migraine days a month.   08/22/2021 ALL: Sabrina Duke returns for follow up for migraines. She continues topiramate 150mg  daily at bedtime and sumatriptan 100mg  as needed. She feels that migraines are about the same. She usually has about 8 migraine days per month. Migraines worsen during season changes. Sumatriptan usually works well. Occasionally she will take a round of Amerge that will break a bad cycle. She has recently started Ativan for anxiety and feels this has helped migraines. She is followed closely by psychiatry. She does note that she snores. She will wake herself at times snoring or coughing. Most migraines come on through the day but 1-2 per month are present in the morning when she wakes. She does wake with dry mouth. She denies nocturia. Some daytime sleepiness but does not normally fall asleep. ESS 8.   06/15/20 ALL (Mychart): Sabrina Duke is a 66 y.o. female here today for follow up for migraines. She was last seen by [facility] in 09/2019. She continues topiramate 150mg  at bedtime and sumatriptan for abortive therapy. She was unable to tolerate Amovig due to constipation. Emgality or Ajovy was not started do to fears of side effects.    She reports that headaches have improved over the past 3-4 months. She was diagnosed with Celiac disease and has made significant diet changes. She does not eat gluten. She has about 8 headache days per month. Usually easily aborted with sumatriptan. She is no longer needing a full prescription of sumatriptan. She has had difficulty with constipation and is followed closely by GI.    Tried and failed: Topiramate (on now), Inderal, gabapentin, nortriptyline, Depakote, Maxalt, Emerge, Botox, Amovig  (constipation), sumatriptan (on now), Nurtec (nausea)   09/09/19 MM: Sabrina Duke is a 66 year old female with a history of migraine headaches. She returns today for follow-up.  She reports that she never tried Ajovy. Reports in the last month she has had 15 headaches.  She states prior to she was having 7-8 headaches a month.  She states that sumatriptan normally resolves her headache in about 30 minutes.  Her headaches always occur on the left side.  She does have photophobia, phonophobia, nausea and vomiting.  She has Phenergan for nausea. The patient reports that she never tried Ajovy because she was worried about the potential side effects.  She has continued on Topamax 150 mg daily.   Tried and failed: Inderal, gabapentin, nortriptyline, Depakote, Maxalt, Emerge, Botox  02/06/2019 ALL:  Sabrina Duke is a 66 y.o. female here today for follow up for migraines. She continues to have regular headaches with migrainous features of pounding, nausea, light and sound sensitivity. She has been seen 4 times this month for acute management. She was started on Amovig at last visit in 11/2018. She was not able to tolerate this due to increased constipation. She did not feel that it helped headache. She continues topiramate 150mg  at bedtime. She has been unable to tolerate Inderal, gabapentin, nortriptyline, Depakote, and Imitrex in the past. She has used Maxalt and Emerge for abortive therapy but is not sure they help. She also uses phenergan and compazine for nausea as needed.   11/12/2018 ALL: Sabrina Duke is a 66 y.o. female here today for follow  up for migraines.  She continues to have regular migraines.  She states that she is going through a full prescription of Maxalt every month.  She does feel that Maxalt tends to help abort migraine.  She does continue topiramate 150 mg at bedtime without adverse effects.  She is uncertain if this is helping.  She reports that migraines are typical to those in the past.  She  has no new symptoms.  She has tried multiple medications in the past but has never taken Aimovig.   02/26/2018 MM: Sabrina Duke is a 66 year old female with a history of migraine headaches.  She returns today for follow-up.  He states that her headaches have remained relatively the same.  She states that she has 10 severe headaches a month but has at least 15 headache days a month.  Her headaches always occur on the left side.  She does have photophobia and phonophobia as well as nausea and vomiting.  She takes Maxalt but it does not always resolve the headache.  She typically has to take Phenergan and occasionally Compazine to make her go to sleep for headache to resolve.  In the past she has tried multiple medications including  Inderal, Depakote, gabapentin, nortriptyline and Botox without benefit.  She returns today for evaluation.   HISTORY Sabrina Duke is a 66 y.o. female here as a referral from Dr. Clarisse Gouge for migraines.  Past medical history of migraines, chronic low back pain, depression.  She has migraines for decades. She uses 12 maxalt a month, does not use Tylenol or ibuprofen or other medications. She takes Demerol for severe headaches. In a month she has 20 headache days a month on average and 12 are migrainous. Barometric pressure is a trigger. No known other triggers, maybe stress or sleeping. She endorses photophobia/phonophobia. Migraines can last > 24 hours. No medication overuse. No aura.  Headaches are unilateral, pulsating and pounding. Can be severe. No other focal neurologic deficits, associated symptoms, inciting events or modifiable factors.   Numbness in the leg started 2 months ago, hurts more with leg in a recliner, tingling from the knee to the toe. She has spasms in the back, radicular symptoms, bending over makes it worse with spasms. She has tried conservative measures for 2 months, heat, stretching, exercise, PT, NSAIDs. No relief. Has weakness. It is worsening slowly. No inciting  events or trauma.   Reviewed notes, labs and imaging from outside physicians, which showed:   2013 Myelogram: FINDINGS:  AGAIN NOTED PATIENT HAS 6 LUMBAR VERTEBRAL BODIES. THERE IS A MINIMAL VENTRAL DEFECT AT THE LEVEL OF L5/6 SEEN ON LATERAL FILM. THE NERVE ROOTS ARE WELL FILLED WITHOUT EVIDENCE OF COMPRESSION OR DISPLACEMENT. THE CONUS IS NORMAL IN APPEARANCE.  CONCLUSIONS:  1.  SIX LUMBAR VERTEBRAL BODIES. 2.  NO EVIDENCE OF NERVE ROOT COMPRESSION OR DISPLACEMENT. 3.  SMALL VENTRAL BULGE AT L5/6. APPROVING MD: Murtis Sink   Reviewed prior notes, patient was seen in the emergency room in November 2015 complaining of intermittent severe migraine headaches.  Localized in her left temple and left cheek described as throbbing.  She reported nausea, vomiting, blurred vision, photophobia, phonophobia.  She tried Demerol and Compazine with no relief.  Felt as though there was fluid in her ears.  Headaches improved after IV fluids and medications and she was discharged home.    REVIEW OF SYSTEMS: Out of a complete 14 system review of symptoms, the patient complains only of the following symptoms, dizziness, headache, snoring,  and all other reviewed systems are negative.     08/22/2021    2:03 PM  Results of the Epworth flowsheet  Sitting and reading 1  Watching TV 2  Sitting, inactive in a public place (e.g. a theatre or a meeting) 1  As a passenger in a car for an hour without a break 0  Lying down to rest in the afternoon when circumstances permit 2  Sitting and talking to someone 1  Sitting quietly after a lunch without alcohol 1  In a car, while stopped for a few minutes in traffic 0  Total score 8     ALLERGIES: Allergies  Allergen Reactions   Bentyl [Dicyclomine] Other (See Comments)    Patient states it caused leg and arm spasms.   Ivp Dye [Iodinated Contrast Media] Anaphylaxis   Antihistamines, Loratadine-Type Other (See Comments)    Sleepy   Ciprofloxacin Nausea  And Vomiting   Dymista [Azelastine-Fluticasone] Other (See Comments)    Heachache   Gluten Meal    Lortab [Hydrocodone-Acetaminophen] Nausea And Vomiting   Macrobid [Nitrofurantoin] Other (See Comments)    Unknown   Montelukast Other (See Comments)    Sleep   Nsaids     Pt can tolerate small amounts of Aleve "mess my stomach up, have had ulcer in the past"   Nurtec [Rimegepant Sulfate] Nausea Only   Other     Patient not sure about these allergies  Darvocet-   Mermantin- "makes me sick on my stomach"  Bio Citrate Calcium with Vitamin D- "hurt stomach"      Pepcid [Famotidine] Other (See Comments)    Headache   Prednisone     "makes me hyper and gives me a headache"   Prilosec [Omeprazole] Other (See Comments)    Headache   Rosuvastatin Calcium Other (See Comments)    Headaches.   Vantin [Cefpodoxime] Nausea And Vomiting   Zofran [Ondansetron] Other (See Comments)    Causes headaches per pt    HOME MEDICATIONS: Outpatient Medications Prior to Visit  Medication Sig Dispense Refill   buPROPion (WELLBUTRIN XL) 150 MG 24 hr tablet Take 1 tablet (150 mg total) by mouth daily. 90 tablet 1   buPROPion ER (WELLBUTRIN SR) 100 MG 12 hr tablet Take 1 tablet (100 mg total) by mouth daily in the afternoon. 90 tablet 1   Eszopiclone 3 MG TABS Take 1 tablet (3 mg total) by mouth at bedtime. Take immediately before bedtime 30 tablet 5   levothyroxine (SYNTHROID) 50 MCG tablet Take 50 mcg by mouth daily before breakfast.     LORazepam (ATIVAN) 0.5 MG tablet TAKE 1 TABLET BY MOUTH EVERY 8 HOURS 30 tablet 0   Magnesium 200 MG TABS Take 200 mg by mouth daily.      naratriptan (AMERGE) 2.5 MG tablet Take 1 tablet (2.5 mg total) by mouth as needed for migraine. Take one (1) tablet at onset of headache; if returns or does not resolve, may repeat after 4 hours; do not exceed five (5) mg in 24 hours. 10 tablet 0   promethazine (PHENERGAN) 25 MG tablet Take 1 tablet (25 mg total) by mouth every 6  (six) hours as needed for nausea or vomiting. 12 tablet 0   promethazine (PHENERGAN) 25 MG tablet Take by mouth.     SUMAtriptan (IMITREX) 100 MG tablet Take 1 tablet (100 mg total) by mouth once as needed for up to 1 dose for migraine. May repeat once in 2 hours if headache persists or  recurs. PLEASE KEEP UPCOMING APPT FOR CONTINUED REFILLS 12 tablet 11   topiramate (TOPAMAX) 50 MG tablet Take 3 tablets (150 mg total) by mouth at bedtime. 270 tablet 3   Vitamin D, Ergocalciferol, (DRISDOL) 1.25 MG (50000 UNIT) CAPS capsule Take 50,000 Units by mouth once a week.     No facility-administered medications prior to visit.    PAST MEDICAL HISTORY: Past Medical History:  Diagnosis Date   Allergic rhinitis    Chronic kidney disease    Decreased bone density    Depression    GERD (gastroesophageal reflux disease)    Hypothyroid    Iron deficiency anemia    Low vitamin D level    Migraine    Osteoporosis     PAST SURGICAL HISTORY: Past Surgical History:  Procedure Laterality Date   BIOPSY  03/10/2019   Procedure: BIOPSY;  Surgeon: Malissa Hippoehman, Najeeb U, MD;  Location: [facility];  Service: Endoscopy;;  gastric    BIOPSY  03/24/2020   Procedure: BIOPSY;  Surgeon: Malissa Hippoehman, Najeeb U, MD;  Location: [facility];  Service: Endoscopy;;  duodenum gastric   COLONOSCOPY WITH PROPOFOL N/A 03/24/2020   Procedure: COLONOSCOPY WITH PROPOFOL;  Surgeon: Malissa Hippoehman, Najeeb U, MD;  Location: [facility];  Service: Endoscopy;  Laterality: N/A;  1225   ESOPHAGOGASTRODUODENOSCOPY N/A 03/10/2019   Procedure: ESOPHAGOGASTRODUODENOSCOPY (EGD);  Surgeon: Malissa Hippoehman, Najeeb U, MD;  Location: [facility];  Service: Endoscopy;  Laterality: N/A;  2:40-rescheduled 10/5 @11 :55am per office   ESOPHAGOGASTRODUODENOSCOPY (EGD) WITH PROPOFOL N/A 03/24/2020   Procedure: ESOPHAGOGASTRODUODENOSCOPY (EGD) WITH PROPOFOL;  Surgeon: Malissa Hippoehman, Najeeb U, MD;  Location: [facility];  Service: Endoscopy;  Laterality: N/A;    hemroidectomy  1990   OVARIAN CYST SURGERY  1972   SHOULDER SURGERY Left 11/2017   TUBAL LIGATION  1997   uterine polyp removal  05/2015    FAMILY HISTORY: Family History  Problem Relation Age of Onset   Anxiety disorder Mother    Depression Mother    Bone cancer Mother    OCD Mother    Cerebral aneurysm Maternal Uncle    Drug abuse Brother    Depression Brother     SOCIAL HISTORY: Social History   Socioeconomic History   Marital status: Married    Spouse name: Not on file   Number of children: 1   Years of education: Not on file   Highest education level: High school graduate  Occupational History   Occupation: disabled  Tobacco Use   Smoking status: Former   Smokeless tobacco: Never  Building services engineerVaping Use   Vaping Use: Never used  Substance and Sexual Activity   Alcohol use: No   Drug use: No   Sexual activity: Not on file  Other Topics Concern   Not on file  Social History Narrative   Lives at home with husband   Right handed   No caffeine   Social Determinants of Health   Financial Resource Strain: Not on file  Food Insecurity: Not on file  Transportation Needs: Not on file  Physical Activity: Not on file  Stress: Not on file  Social Connections: Not on file  Intimate Partner Violence: Not on file      PHYSICAL EXAM  There were no vitals filed for this visit.   There is no height or weight on file to calculate BMI.  Generalized: Well developed, in no acute distress  Cardiology: normal rate and rhythm, no murmur noted Neurological examination  Mentation: Alert oriented  to time, place, history taking. Follows all commands speech and language fluent Cranial nerve II-XII: Pupils were equal round reactive to light. Extraocular movements were full, visual field were full on confrontational test. Facial sensation and strength were normal. Head turning and shoulder shrug  were normal and symmetric. Motor: The motor testing reveals 5 over 5 strength of all 4  extremities. Good symmetric motor tone is noted throughout.  Gait and station: Gait is normal.   DIAGNOSTIC DATA (LABS, IMAGING, TESTING) - I reviewed patient records, labs, notes, testing and imaging myself where available.      No data to display           Lab Results  Component Value Date   WBC 3.8 (L) 03/08/2020   HGB 14.6 03/08/2020   HCT 43.5 03/08/2020   MCV 88.4 03/08/2020   PLT 141 (L) 03/08/2020      Component Value Date/Time   NA 139 03/08/2020 2241   K 4.0 03/08/2020 2241   CL 109 03/08/2020 2241   CO2 22 03/08/2020 2241   GLUCOSE 97 03/08/2020 2241   BUN 17 03/08/2020 2241   CREATININE 0.96 03/08/2020 2241   CALCIUM 9.3 03/08/2020 2241   PROT 6.7 03/08/2020 2241   ALBUMIN 4.3 03/08/2020 2241   AST 20 03/08/2020 2241   ALT 14 03/08/2020 2241   ALKPHOS 78 03/08/2020 2241   BILITOT 0.5 03/08/2020 2241   GFRNONAA >60 03/08/2020 2241   GFRAA >60 03/08/2020 2241   No results found for: "CHOL", "HDL", "LDLCALC", "LDLDIRECT", "TRIG", "CHOLHDL" No results found for: "HGBA1C" No results found for: "VITAMINB12" Lab Results  Component Value Date   TSH 1.911 01/17/2020       ASSESSMENT AND PLAN 66 y.o. year old female  has a past medical history of Allergic rhinitis, Chronic kidney disease, Decreased bone density, Depression, GERD (gastroesophageal reflux disease), Hypothyroid, Iron deficiency anemia, Low vitamin D level, Migraine, and Osteoporosis. here with   No diagnosis found.    Ms. Saine feels that migraines are stable and may even be improving slightly after starting Ativan for anxiety. She will continue topiramate 150mg  QHS and sumatriptan 100mg  as needed. She was advised against regular use of abortive medications. We have discussed option of trying Emgality versus Ajovy (Amovig caused constipation) or adding Qulipta daily for prevention if headaches worsen. She will also think about sleep evaluation for possible HST. She was encouraged to continue  healthy lifestyle habits. She will follow-up with me in 1 year if doing well, sooner if needed.  She verbalizes understanding and agreement with this plan.   No orders of the defined types were placed in this encounter.    No orders of the defined types were placed in this encounter.      , FNP-C 12/14/2021, 4:20 PM [facility] [address] [place], [address] [place] [phone]

## 2021-12-15 ENCOUNTER — Ambulatory Visit: Payer: Medicare Other | Admitting: Family Medicine

## 2021-12-15 ENCOUNTER — Encounter: Payer: Self-pay | Admitting: Family Medicine

## 2021-12-15 VITALS — BP 110/62 | HR 77 | Ht 64.0 in | Wt 140.0 lb

## 2021-12-15 DIAGNOSIS — R519 Headache, unspecified: Secondary | ICD-10-CM

## 2021-12-15 DIAGNOSIS — G43709 Chronic migraine without aura, not intractable, without status migrainosus: Secondary | ICD-10-CM

## 2021-12-15 DIAGNOSIS — R0683 Snoring: Secondary | ICD-10-CM

## 2021-12-15 NOTE — Patient Instructions (Signed)
Below is our plan:  We will continue topiramate 150mg  daily and sumatriptan 100mg  as needed. Amerge for emergencies.   Please make sure you are staying well hydrated. I recommend 50-60 ounces daily. Well balanced diet and regular exercise encouraged. Consistent sleep schedule with 6-8 hours recommended.   Please continue follow up with care team as directed.   Follow up with sleep med as directed    You may receive a survey regarding today's visit. I encourage you to leave honest feed back as I do use this information to improve patient care. Thank you for seeing me today!

## 2021-12-19 ENCOUNTER — Telehealth: Payer: Self-pay | Admitting: Family Medicine

## 2021-12-19 ENCOUNTER — Ambulatory Visit: Payer: Medicare Other | Admitting: Psychiatry

## 2021-12-19 NOTE — Telephone Encounter (Signed)
Referral sent to [facility]

## 2021-12-20 ENCOUNTER — Telehealth: Payer: Self-pay | Admitting: Family Medicine

## 2021-12-20 MED ORDER — NARATRIPTAN HCL 2.5 MG PO TABS
2.5000 mg | ORAL_TABLET | ORAL | 0 refills | Status: DC | PRN
Start: 2021-12-20 — End: 2022-04-20

## 2021-12-20 NOTE — Telephone Encounter (Signed)
Spoke w/ Amy. She approved calling in Amerge 2.5mg , directions: Take one tablet in AM and at bedtime for 5 days. #10, 0 refills. Advised she should not to take more than two total doses of amerge AND/OR sumatriptan in 24 hours.  I called pt and LVM relaying above message. Also advised per Amy that she needs to complete SS that was ordered. Cannot continue refilling Amerge if this is not completed. Looks like sleep lab has tried to call her once to schedule. She can call them back at [phone].   Asked her to call them back to schedule this and to call us if she has any questions.

## 2021-12-20 NOTE — Telephone Encounter (Signed)
Pt called and requested provider call in prescription " emerge" to break her cycle headaches. Pt is requesting a return call from the nurse.

## 2021-12-26 ENCOUNTER — Other Ambulatory Visit: Payer: Self-pay | Admitting: Psychiatry

## 2021-12-26 DIAGNOSIS — F5101 Primary insomnia: Secondary | ICD-10-CM

## 2021-12-26 NOTE — Telephone Encounter (Signed)
Filled 6/26 appt 8/15

## 2022-01-04 ENCOUNTER — Other Ambulatory Visit: Payer: Self-pay | Admitting: Psychiatry

## 2022-01-04 DIAGNOSIS — F331 Major depressive disorder, recurrent, moderate: Secondary | ICD-10-CM

## 2022-01-05 NOTE — Telephone Encounter (Signed)
Patient is on 2 different Wellbutrin doses, but only received 1 RF request. Called patient to ask if she needed both refilled.

## 2022-01-05 NOTE — Telephone Encounter (Signed)
Sabrina Duke called this morning at 11:05 to check status of her Wellbutrin refill.  She is going out of town and wanted to be sure it was in today so she can pick it up before she leaves town.  Appt 8/15

## 2022-01-12 IMAGING — CT CT ABD-PELV W/O CM
2 of 4 series · 16 of 46 positions shown, 18 images · non-contrast
Comparison: January 19, 2020

CLINICAL DATA: Abdominal pain, left-sided

EXAM:
CT ABDOMEN AND PELVIS WITHOUT CONTRAST
TECHNIQUE: Multidetector CT imaging of the abdomen and pelvis was performed
following the standard protocol without IV contrast.

[Series 2: axial st · axial · 0.67mm/px · z∈[-391,-1]mm · 13 of 90 slices shown, 15 images]
[im 6/90  soft-tissue]
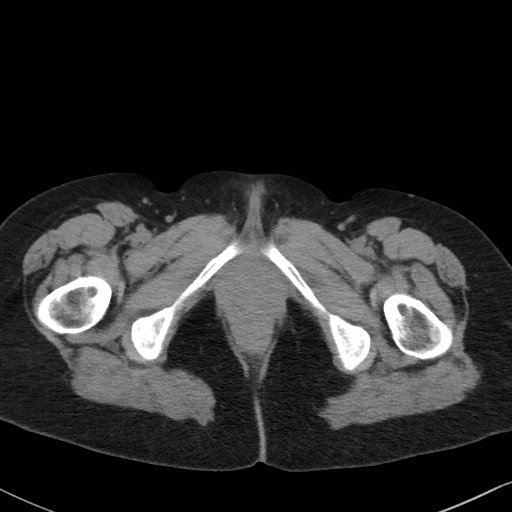
[im 6/90  bone]
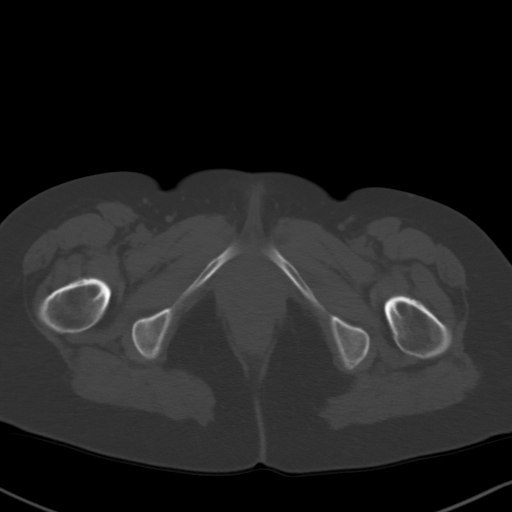
[im 11/90  soft-tissue]
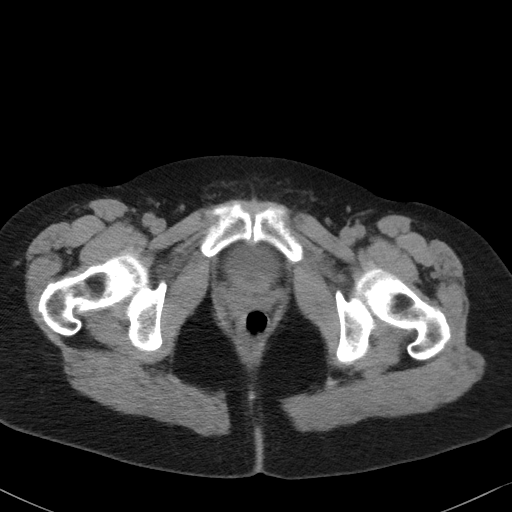
[im 21/90  soft-tissue]
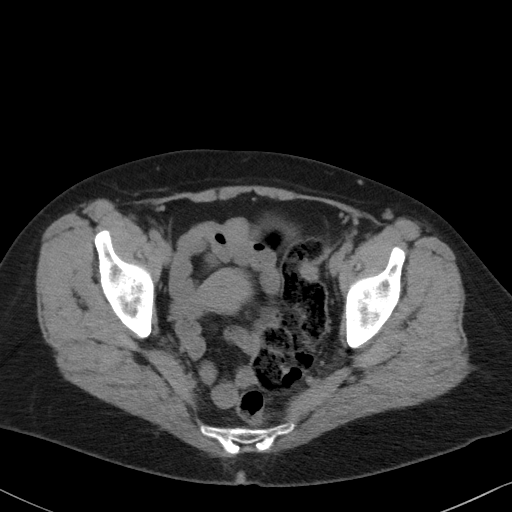
[im 27/90  soft-tissue]
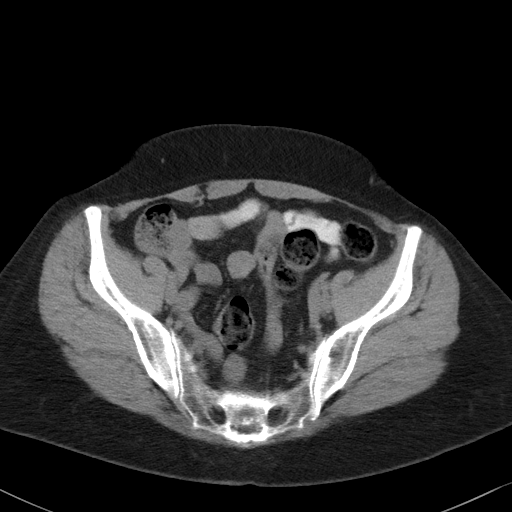
[im 32/90  soft-tissue]
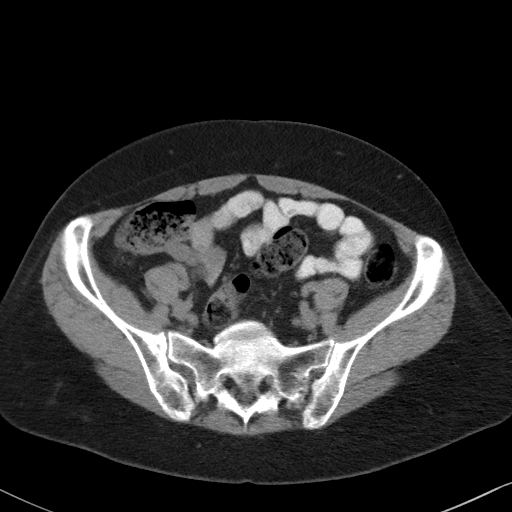
[im 37/90  soft-tissue]
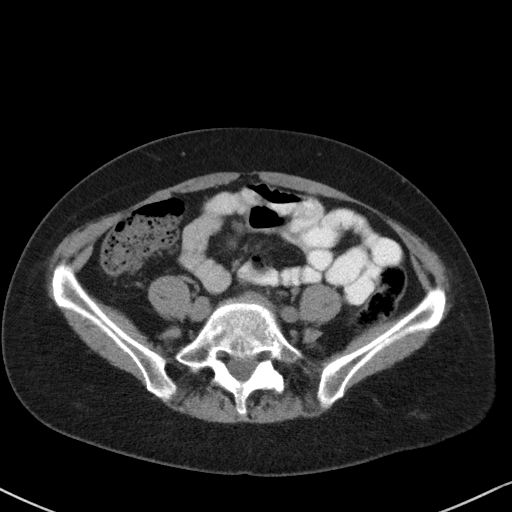
[im 48/90  soft-tissue]
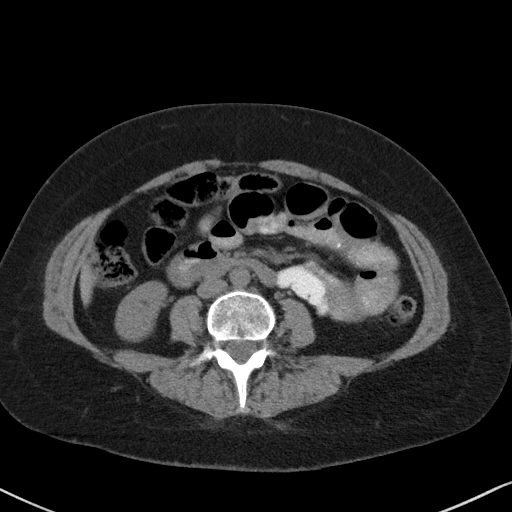
[im 53/90  soft-tissue]
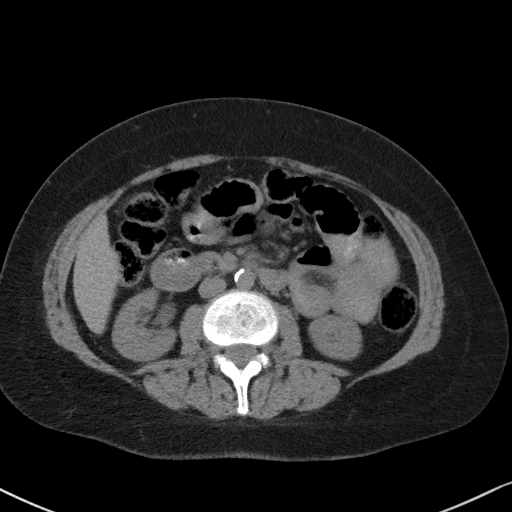
[im 58/90  soft-tissue]
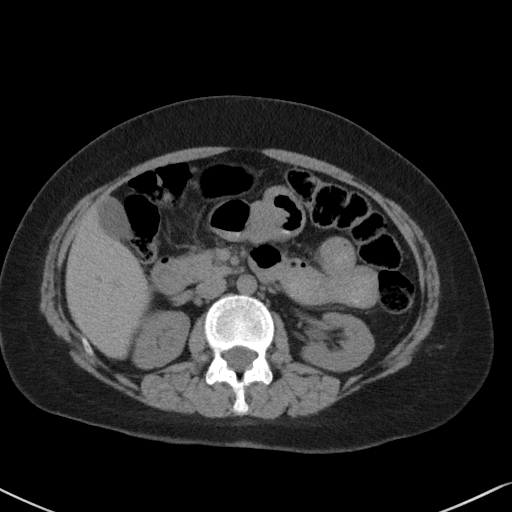
[im 58/90  bone]
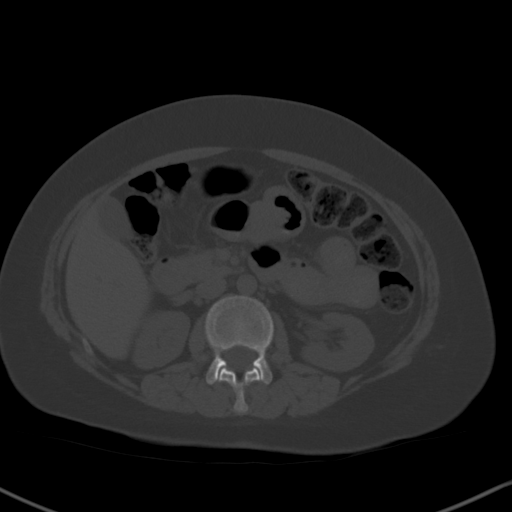
[im 63/90  soft-tissue]
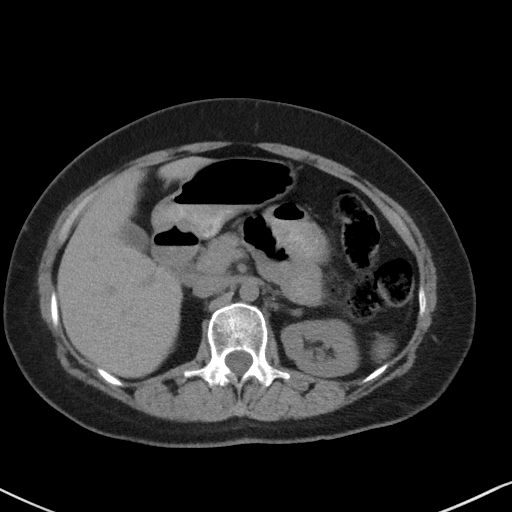
[im 69/90  soft-tissue]
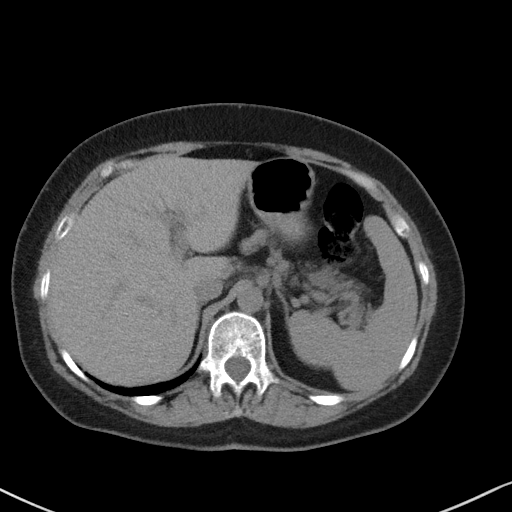
[im 79/90  soft-tissue]
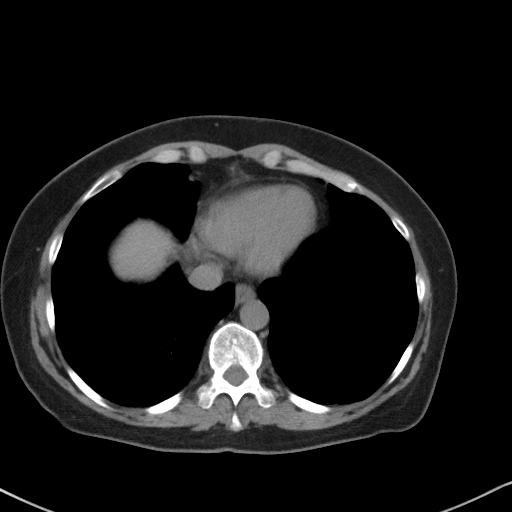
[im 84/90  soft-tissue]
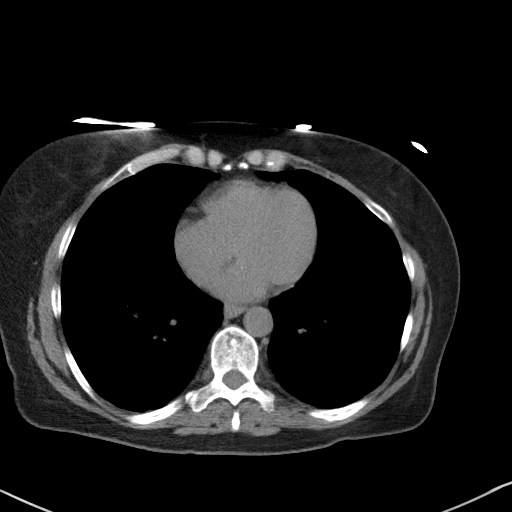

[Series 5: coronal st · coronal · 0.67mm/px · 3 of 82 slices shown]
[im 28/82  soft-tissue]
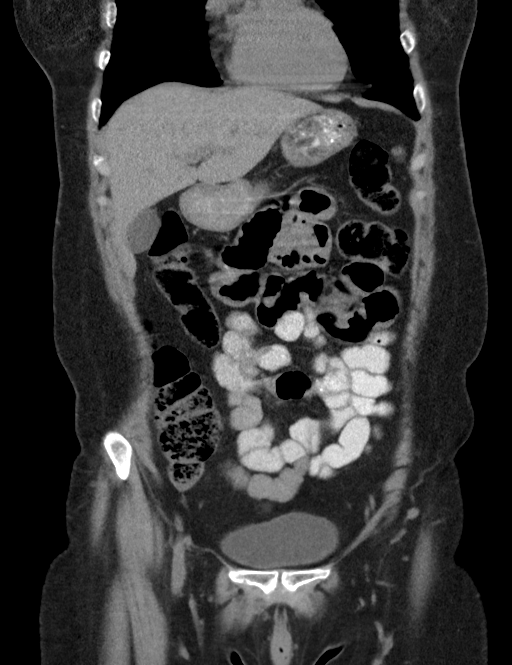
[im 37/82  soft-tissue]
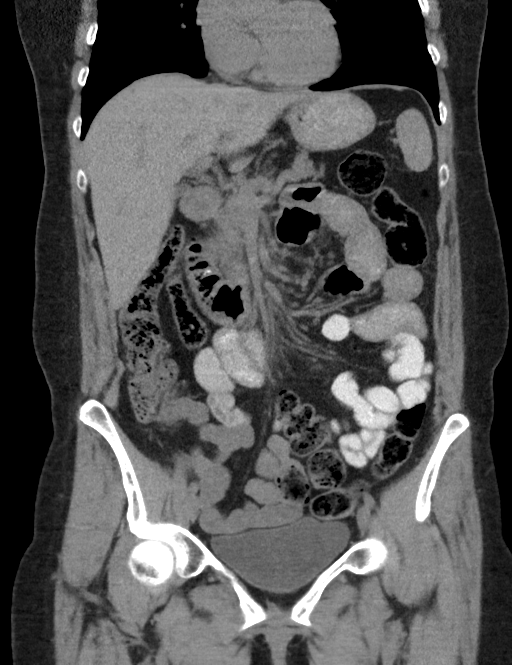
[im 46/82  soft-tissue]
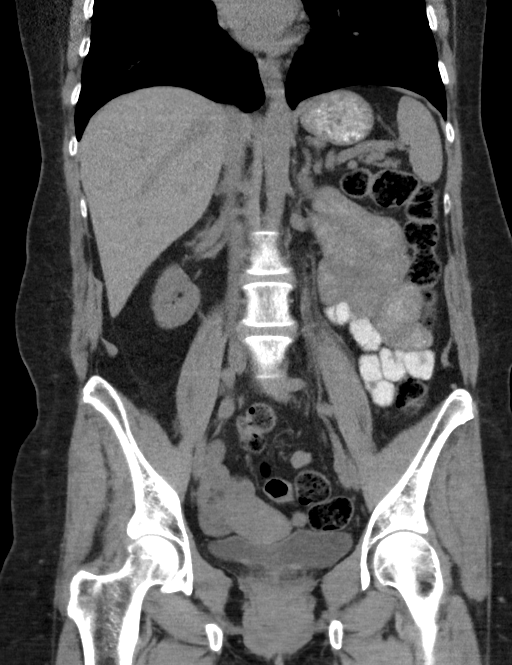

[16 of 46 positions shown; findings below may reference images not displayed]

FINDINGS: Lower chest: The visualized heart size within normal limits. No
pericardial fluid/thickening.

No hiatal hernia.

There is a tiny 4 mm nodule in the posterior left lung base.

Hepatobiliary: Although limited due to the lack of intravenous
contrast, normal in appearance without gross focal abnormality. No
evidence of calcified gallstones or biliary ductal dilatation.

Pancreas:  Unremarkable.  No surrounding inflammatory changes.

Spleen: Normal in size. Although limited due to the lack of
intravenous contrast, normal in appearance.

Adrenals/Urinary Tract: Both adrenal glands appear normal. The
kidneys and collecting system appear normal without evidence of
urinary tract calculus or hydronephrosis. Bladder is unremarkable.

Stomach/Bowel: The stomach is unremarkable. There is question of
mild wall thickening seen within the second and third portion of the
duodenum. The remainder of the small bowel is unremarkable. A
moderate amount of colonic stool is present.

Vascular/Lymphatic: There are no enlarged abdominal or pelvic lymph
nodes. Scattered mild aortic atherosclerotic calcifications are seen
without aneurysmal dilatation.

Reproductive: The uterus and adnexa are unremarkable.

Other: No evidence of abdominal wall mass or hernia.

Musculoskeletal: No acute or significant osseous findings. Chronic
slight superior compression deformity of the T12 vertebral body is
seen.
IMPRESSION: Possible mild wall thickening at the second and third portion of the
duodenum which could be due to mild duodenitis.

Moderate amount of colonic stool without evidence of obstruction.

Aortic Atherosclerosis ([id number]).

## 2022-01-17 ENCOUNTER — Ambulatory Visit: Payer: Medicare Other | Admitting: Psychiatry

## 2022-01-25 ENCOUNTER — Telehealth (INDEPENDENT_AMBULATORY_CARE_PROVIDER_SITE_OTHER): Payer: Medicare Other | Admitting: Psychiatry

## 2022-01-25 ENCOUNTER — Encounter: Payer: Self-pay | Admitting: Psychiatry

## 2022-01-25 VITALS — BP 110/70

## 2022-01-25 DIAGNOSIS — F330 Major depressive disorder, recurrent, mild: Secondary | ICD-10-CM

## 2022-01-25 DIAGNOSIS — F419 Anxiety disorder, unspecified: Secondary | ICD-10-CM

## 2022-01-25 DIAGNOSIS — F5101 Primary insomnia: Secondary | ICD-10-CM

## 2022-01-25 DIAGNOSIS — F4321 Adjustment disorder with depressed mood: Secondary | ICD-10-CM

## 2022-01-25 DIAGNOSIS — F331 Major depressive disorder, recurrent, moderate: Secondary | ICD-10-CM

## 2022-01-25 MED ORDER — BUPROPION HCL ER (SR) 100 MG PO TB12: ORAL_TABLET | ORAL | 1 refills | Status: DC

## 2022-01-25 MED ORDER — BUPROPION HCL ER (XL) 150 MG PO TB24: 150.0000 mg | ORAL_TABLET | Freq: Every day | ORAL | 1 refills | Status: DC

## 2022-01-25 MED ORDER — LORAZEPAM 0.5 MG PO TABS: 0.5000 mg | ORAL_TABLET | Freq: Three times a day (TID) | ORAL | 5 refills | Status: DC

## 2022-01-25 MED ORDER — ESZOPICLONE 3 MG PO TABS
3.0000 mg | ORAL_TABLET | Freq: Every day | ORAL | 4 refills | Status: DC
Start: 2022-02-22 — End: 2022-07-21

## 2022-01-25 NOTE — Progress Notes (Signed)
Sabrina Duke [id number] 26-Aug-1955 66 y.o.  Virtual Visit via Video Note I connected with pt @ on 01/25/22 at 10:30 AM EDT by a video enabled telemedicine application and verified that I am speaking with the correct person using two identifiers.   I discussed the limitations of evaluation and management by telemedicine and the availability of in person appointments. The patient expressed understanding and agreed to proceed.  I discussed the assessment and treatment plan with the patient. The patient was provided an opportunity to ask questions and all were answered. The patient agreed with the plan and demonstrated an understanding of the instructions.   The patient was advised to call back or seek an in-person evaluation if the symptoms worsen or if the condition fails to improve as anticipated.  I provided 25 minutes of non-face-to-face time during this encounter.  The patient was located at home.  The provider was located at home.   Corie Chiquito, PMHNP   Subjective:   Patient ID:  Sabrina Duke is a 66 y.o. (DOB 1955-11-15) female.  Chief Complaint:  Chief Complaint  Patient presents with   Depression   Anxiety   Follow-up    Insomnia    Depression        Past medical history includes anxiety.   Anxiety     Osie Bond presents for follow-up of depression, anxiety, and insomnia. She reports that she is "doing ok." Reports that her mother has had an overall decline. Mother was in a coma for 3 days and has improved some since then. She has been going to mother's SNF at least daily to help with caregiving. She reports limited involvement from other family members with mother's care. She reports that she has had increased stress and anxiety with caregiving for her mother. She reports feeling "wore out" especially after returning from SNF with mother. Her motivation has been lower. "I'm doing the best I can." She reports that she has had a couple of episodes of crying and  wishing her brothers were living and could help support her and her mother. Denies persistent depression. Some sadness that helping her mother interfered with her being able to spend as much time as she had hoped with her grandchildren over the summer. Sleeping well. Has been eating well and craving Dairy [name]. She reports poor concentration. She reports that she had a brief moment where she was driving and had to think for a few seconds on how to get home. Denies SI.   She reports that she has been taking Lorazepam prn daily.    Past medication trials: Wellbutrin- Effective, well tolerated Trintellix-GI side effects Pristiq- somewhat helpful Lexapro Celexa Prozac- Helped, caused headaches Cymbalta Sertraline Rexulti Vraylar-jittery, hallucinations Latuda-joint aches Abilify-weight gain Lamictal BuSpar- Weight gain Topamax Trazodone-excessive somnolence Lunesta Ambien-ineffective Adderall XR Ativan-Effective   Review of Systems:  Review of Systems  Musculoskeletal:  Negative for gait problem.  Neurological:        Reports chronic headaches have been "about the same." Reports worsening HA's in the spring. She reports HA specialist recommended sleep study and she will have a consultation next week. Reports she has been snoring.   Psychiatric/Behavioral:  Positive for depression.        Please refer to HPI    Medications: I have reviewed the patient's current medications.  Current Outpatient Medications  Medication Sig Dispense Refill   levothyroxine (SYNTHROID) 50 MCG tablet Take 50 mcg by mouth daily before breakfast.  Magnesium 200 MG TABS Take 200 mg by mouth daily.      naratriptan (AMERGE) 2.5 MG tablet Take 1 tablet (2.5 mg total) by mouth as needed for migraine. Take one (1) tablet at onset of headache; if returns or does not resolve, may repeat after 4 hours; do not exceed five (5) mg in 24 hours. 10 tablet 0   promethazine (PHENERGAN) 25 MG tablet Take 1 tablet  (25 mg total) by mouth every 6 (six) hours as needed for nausea or vomiting. 12 tablet 0   promethazine (PHENERGAN) 25 MG tablet Take by mouth.     SUMAtriptan (IMITREX) 100 MG tablet Take 1 tablet (100 mg total) by mouth once as needed for up to 1 dose for migraine. May repeat once in 2 hours if headache persists or recurs. PLEASE KEEP UPCOMING APPT FOR CONTINUED REFILLS 12 tablet 11   topiramate (TOPAMAX) 50 MG tablet Take 3 tablets (150 mg total) by mouth at bedtime. 270 tablet 3   Vitamin D, Ergocalciferol, (DRISDOL) 1.25 MG (50000 UNIT) CAPS capsule Take 50,000 Units by mouth once a week.     buPROPion (WELLBUTRIN XL) 150 MG 24 hr tablet Take 1 tablet (150 mg total) by mouth daily. 90 tablet 1   buPROPion ER (WELLBUTRIN SR) 100 MG 12 hr tablet TAKE 1 TABLET BY MOUTH EVERY AFTERNOON 90 tablet 1   [START ON 02/22/2022] Eszopiclone 3 MG TABS Take 1 tablet (3 mg total) by mouth at bedtime. Take immediately before bedtime 30 tablet 4   LORazepam (ATIVAN) 0.5 MG tablet Take 1 tablet (0.5 mg total) by mouth every 8 (eight) hours. 30 tablet 5   No current facility-administered medications for this visit.    Medication Side Effects: None  Allergies:  Allergies  Allergen Reactions   Bentyl [Dicyclomine] Other (See Comments)    Patient states it caused leg and arm spasms.   Ivp Dye [Iodinated Contrast Media] Anaphylaxis   Antihistamines, Loratadine-Type Other (See Comments)    Sleepy   Ciprofloxacin Nausea And Vomiting   Dymista [Azelastine-Fluticasone] Other (See Comments)    Heachache   Gluten Meal    Lortab [Hydrocodone-Acetaminophen] Nausea And Vomiting   Macrobid [Nitrofurantoin] Other (See Comments)    Unknown   Montelukast Other (See Comments)    Sleep   Nsaids     Pt can tolerate small amounts of Aleve "mess my stomach up, have had ulcer in the past"   Nurtec [Rimegepant Sulfate] Nausea Only   Other     Patient not sure about these allergies  Darvocet-   Mermantin- "makes me  sick on my stomach"  Bio Citrate Calcium with Vitamin D- "hurt stomach"      Pepcid [Famotidine] Other (See Comments)    Headache   Prednisone     "makes me hyper and gives me a headache"   Prilosec [Omeprazole] Other (See Comments)    Headache   Rosuvastatin Calcium Other (See Comments)    Headaches.   Vantin [Cefpodoxime] Nausea And Vomiting   Zofran [Ondansetron] Other (See Comments)    Causes headaches per pt    Past Medical History:  Diagnosis Date   Allergic rhinitis    Chronic kidney disease    Decreased bone density    Depression    GERD (gastroesophageal reflux disease)    Hypothyroid    Iron deficiency anemia    Low vitamin D level    Migraine    Osteoporosis     Family History  Problem Relation  Age of Onset   Anxiety disorder Mother    Depression Mother    Bone cancer Mother    OCD Mother    Cerebral aneurysm Maternal Uncle    Drug abuse Brother    Depression Brother     Social History   Socioeconomic History   Marital status: Married    Spouse name: Not on file   Number of children: 1   Years of education: Not on file   Highest education level: High school graduate  Occupational History   Occupation: disabled  Tobacco Use   Smoking status: Former   Smokeless tobacco: Never  Building services engineer Use: Never used  Substance and Sexual Activity   Alcohol use: No   Drug use: No   Sexual activity: Not on file  Other Topics Concern   Not on file  Social History Narrative   Lives at home with husband   Right handed   No caffeine   Social Determinants of Corporate investment banker Strain: Not on file  Food Insecurity: Not on file  Transportation Needs: Not on file  Physical Activity: Not on file  Stress: Not on file  Social Connections: Not on file  Intimate Partner Violence: Not on file    Past Medical History, Surgical history, Social history, and Family history were reviewed and updated as appropriate.   Please see review of  systems for further details on the patient's review from today.   Objective:   Physical Exam:  BP 110/70   Physical Exam Neurological:     Mental Status: She is alert and oriented to person, place, and time.     Cranial Nerves: No dysarthria.  Psychiatric:        Attention and Perception: Attention and perception normal.        Speech: Speech normal.        Behavior: Behavior is cooperative.        Thought Content: Thought content normal. Thought content is not paranoid or delusional. Thought content does not include homicidal or suicidal ideation. Thought content does not include homicidal or suicidal plan.        Cognition and Memory: Cognition and memory normal.        Judgment: Judgment normal.     Comments: Insight intact Mood is appropriate to content and affect is congruent. Some sadness in response to some losses.      Lab Review:     Component Value Date/Time   NA 139 03/08/2020 2241   K 4.0 03/08/2020 2241   CL 109 03/08/2020 2241   CO2 22 03/08/2020 2241   GLUCOSE 97 03/08/2020 2241   BUN 17 03/08/2020 2241   CREATININE 0.96 03/08/2020 2241   CALCIUM 9.3 03/08/2020 2241   PROT 6.7 03/08/2020 2241   ALBUMIN 4.3 03/08/2020 2241   AST 20 03/08/2020 2241   ALT 14 03/08/2020 2241   ALKPHOS 78 03/08/2020 2241   BILITOT 0.5 03/08/2020 2241   GFRNONAA >60 03/08/2020 2241   GFRAA >60 03/08/2020 2241       Component Value Date/Time   WBC 3.8 (L) 03/08/2020 2241   RBC 4.92 03/08/2020 2241   HGB 14.6 03/08/2020 2241   HCT 43.5 03/08/2020 2241   PLT 141 (L) 03/08/2020 2241   MCV 88.4 03/08/2020 2241   MCH 29.7 03/08/2020 2241   MCHC 33.6 03/08/2020 2241   RDW 12.5 03/08/2020 2241   LYMPHSABS 0.9 01/20/2020 0354   MONOABS 0.3 01/20/2020 0354  EOSABS 0.1 01/20/2020 0354   BASOSABS 0.0 01/20/2020 0354    No results found for: "POCLITH", "LITHIUM"   No results found for: "PHENYTOIN", "PHENOBARB", "VALPROATE", "CBMZ"   .res Assessment: Plan:   Will  continue current plan of care since target signs and symptoms are well controlled without any tolerability issues. Continue Wellbutrin XL 150 mg daily for depression. Continue Wellbutrin SR 100 mg in the afternoon for depression. Continue Lunesta 3 mg at bedtime for insomnia. Continue lorazepam 0.5 mg as needed for anxiety. Pt to follow-up in 6 months or sooner if clinically indicated.  Patient advised to contact office with any questions, adverse effects, or acute worsening in signs and symptoms.  Laylanie was seen today for depression, anxiety and follow-up.  Diagnoses and all orders for this visit:  Mild episode of recurrent major depressive disorder (HCC) -     buPROPion (WELLBUTRIN XL) 150 MG 24 hr tablet; Take 1 tablet (150 mg total) by mouth daily. -     buPROPion ER (WELLBUTRIN SR) 100 MG 12 hr tablet; TAKE 1 TABLET BY MOUTH EVERY AFTERNOON  Primary insomnia -     Eszopiclone 3 MG TABS; Take 1 tablet (3 mg total) by mouth at bedtime. Take immediately before bedtime  Anxiety -     LORazepam (ATIVAN) 0.5 MG tablet; Take 1 tablet (0.5 mg total) by mouth every 8 (eight) hours.     Please see After Visit Summary for patient specific instructions.  Future Appointments  Date Time Provider Department Center  02/01/2022  1:00 PM Dohmeier, Porfirio Mylar, MD [facility] None  08/23/2022  1:30 PM Lomax, Amy, NP [facility] None    No orders of the defined types were placed in this encounter.     -------------------------------

## 2022-02-01 ENCOUNTER — Institutional Professional Consult (permissible substitution): Payer: Medicare Other | Admitting: Neurology

## 2022-02-20 ENCOUNTER — Telehealth: Payer: Self-pay | Admitting: Psychiatry

## 2022-02-20 NOTE — Telephone Encounter (Signed)
Pt LVM requesting early refill on Ativan. Has 2 pills left and not due for RF until 8/23. Pt stated she has had to take 2/d due to life and circumstances. Plan on decreasing back to 1/d this month. Contact # [phone] apt 2/21

## 2022-02-20 NOTE — Telephone Encounter (Signed)
Patient has RF available. Patient notified.

## 2022-03-09 ENCOUNTER — Institutional Professional Consult (permissible substitution): Payer: Medicare Other | Admitting: Neurology

## 2022-03-13 NOTE — Progress Notes (Deleted)
PATIENT: Sabrina Duke DOB: 09/14/1955  REASON FOR VISIT: follow up HISTORY FROM: patient  No chief complaint on file.    HISTORY OF PRESENT ILLNESS:  03/13/22 ALL: Sabrina Duke returns for follow up for migraines. She was last seen 12/2021 and reported worsening headaches. We discussed adding CGRP but she was hesitant. I was concerned of possible sleep apnea and referral placed to sleep med.   12/15/2021 ALL: Sabrina Duke returns for worsening migraines. She was last seen 08/2021 and having about 8 migraine days a month. We continues topiramate 150mg  daily and sumatriptan 100mg . She reports that about 1.5 months ago she started having more headaches. Headaches are different than regular migraines. She describes bilateral maxillary and frontal pain. It is a nagging headache that starts around 11am. Usually worsens during the day. Sometimes she wakes up with headache. She reports being very nauseated, sensitive to light and sound. She stays hoarse. Sumatriptan does knock out headache. She is going through a full rx every month. Amerge usually helps break cycle. She had MRI in on 11/29/2021 that was normal. She has chronic sinus symptoms. She reports antihistamines cause leg cramps. No obvious concerns of TMJ. We discussed possible sleep referral at last visit due to snoring, some morning headaches and dry mouth. She had a brother with sleep apnea. ESS 8.  Tried and failed: Topiramate (on now), Inderal, gabapentin, nortriptyline, Depakote, Maxalt, Emerge, Botox, Amovig (constipation), sumatriptan (on now), Nurtec (nausea)   08/22/2021 ALL: Sabrina Duke returns for follow up for migraines. She continues topiramate 150mg  daily at bedtime and sumatriptan 100mg  as needed. She feels that migraines are about the same. She usually has about 8 migraine days per month. Migraines worsen during season changes. Sumatriptan usually works well. Occasionally she will take a round of Amerge that will break a bad cycle. She has  recently started Ativan for anxiety and feels this has helped migraines. She is followed closely by psychiatry. She does note that she snores. She will wake herself at times snoring or coughing. Most migraines come on through the day but 1-2 per month are present in the morning when she wakes. She does wake with dry mouth. She denies nocturia. Some daytime sleepiness but does not normally fall asleep. ESS 8.   06/15/20 ALL (Mychart): Sabrina Duke is a 66 y.o. female here today for follow up for migraines. She was last seen by [facility] in 09/2019. She continues topiramate 150mg  at bedtime and sumatriptan for abortive therapy. She was unable to tolerate Amovig due to constipation. Emgality or Ajovy was not started do to fears of side effects.    She reports that headaches have improved over the past 3-4 months. She was diagnosed with Celiac disease and has made significant diet changes. She does not eat gluten. She has about 8 headache days per month. Usually easily aborted with sumatriptan. She is no longer needing a full prescription of sumatriptan. She has had difficulty with constipation and is followed closely by GI.    Tried and failed: Topiramate (on now), Inderal, gabapentin, nortriptyline, Depakote, Maxalt, Emerge, Botox, Amovig (constipation), sumatriptan (on now), Nurtec (nausea)   09/09/19 MM: Sabrina Duke is a 66 year old female with a history of migraine headaches. She returns today for follow-up.  She reports that she never tried Ajovy. Reports in the last month she has had 15 headaches.  She states prior to she was having 7-8 headaches a month.  She states that sumatriptan normally resolves her headache in about 30 minutes.  Her headaches always occur on the left side.  She does have photophobia, phonophobia, nausea and vomiting.  She has Phenergan for nausea. The patient reports that she never tried Ajovy because she was worried about the potential side effects.  She has continued on Topamax 150 mg  daily.   Tried and failed: Inderal, gabapentin, nortriptyline, Depakote, Maxalt, Emerge, Botox  02/06/2019 ALL:  Sabrina BondSherry D Duke is a 66 y.o. female here today for follow up for migraines. She continues to have regular headaches with migrainous features of pounding, nausea, light and sound sensitivity. She has been seen 4 times this month for acute management. She was started on Amovig at last visit in 11/2018. She was not able to tolerate this due to increased constipation. She did not feel that it helped headache. She continues topiramate 150mg  at bedtime. She has been unable to tolerate Inderal, gabapentin, nortriptyline, Depakote, and Imitrex in the past. She has used Maxalt and Emerge for abortive therapy but is not sure they help. She also uses phenergan and compazine for nausea as needed.   11/12/2018 ALL: Sabrina Duke is a 66 y.o. female here today for follow up for migraines.  She continues to have regular migraines.  She states that she is going through a full prescription of Maxalt every month.  She does feel that Maxalt tends to help abort migraine.  She does continue topiramate 150 mg at bedtime without adverse effects.  She is uncertain if this is helping.  She reports that migraines are typical to those in the past.  She has no new symptoms.  She has tried multiple medications in the past but has never taken Aimovig.   02/26/2018 MM: Sabrina Duke is a 66 year old female with a history of migraine headaches.  She returns today for follow-up.  He states that her headaches have remained relatively the same.  She states that she has 10 severe headaches a month but has at least 15 headache days a month.  Her headaches always occur on the left side.  She does have photophobia and phonophobia as well as nausea and vomiting.  She takes Maxalt but it does not always resolve the headache.  She typically has to take Phenergan and occasionally Compazine to make her go to sleep for headache to resolve.  In the  past she has tried multiple medications including  Inderal, Depakote, gabapentin, nortriptyline and Botox without benefit.  She returns today for evaluation.   HISTORY Sabrina Duke is a 66 y.o. female here as a referral from Dr. Clarisse GougeLewit for migraines.  Past medical history of migraines, chronic low back pain, depression.  She has migraines for decades. She uses 12 maxalt a month, does not use Tylenol or ibuprofen or other medications. She takes Demerol for severe headaches. In a month she has 20 headache days a month on average and 12 are migrainous. Barometric pressure is a trigger. No known other triggers, maybe stress or sleeping. She endorses photophobia/phonophobia. Migraines can last > 24 hours. No medication overuse. No aura.  Headaches are unilateral, pulsating and pounding. Can be severe. No other focal neurologic deficits, associated symptoms, inciting events or modifiable factors.   Numbness in the leg started 2 months ago, hurts more with leg in a recliner, tingling from the knee to the toe. She has spasms in the back, radicular symptoms, bending over makes it worse with spasms. She has tried conservative measures for 2 months, heat, stretching, exercise, PT, NSAIDs. No relief. Has weakness. It  is worsening slowly. No inciting events or trauma.   Reviewed notes, labs and imaging from outside physicians, which showed:   2013 Myelogram: FINDINGS:  AGAIN NOTED PATIENT HAS 6 LUMBAR VERTEBRAL BODIES. THERE IS A MINIMAL VENTRAL DEFECT AT THE LEVEL OF L5/6 SEEN ON LATERAL FILM. THE NERVE ROOTS ARE WELL FILLED WITHOUT EVIDENCE OF COMPRESSION OR DISPLACEMENT. THE CONUS IS NORMAL IN APPEARANCE.  CONCLUSIONS:  1.  SIX LUMBAR VERTEBRAL BODIES. 2.  NO EVIDENCE OF NERVE ROOT COMPRESSION OR DISPLACEMENT. 3.  SMALL VENTRAL BULGE AT L5/6. APPROVING MD: Murtis Sink   Reviewed prior notes, patient was seen in the emergency room in November 2015 complaining of intermittent severe migraine  headaches.  Localized in her left temple and left cheek described as throbbing.  She reported nausea, vomiting, blurred vision, photophobia, phonophobia.  She tried Demerol and Compazine with no relief.  Felt as though there was fluid in her ears.  Headaches improved after IV fluids and medications and she was discharged home.    REVIEW OF SYSTEMS: Out of a complete 14 system review of symptoms, the patient complains only of the following symptoms, dizziness, headache, snoring, and all other reviewed systems are negative.     08/22/2021    2:03 PM  Results of the Epworth flowsheet  Sitting and reading 1  Watching TV 2  Sitting, inactive in a public place (e.g. a theatre or a meeting) 1  As a passenger in a car for an hour without a break 0  Lying down to rest in the afternoon when circumstances permit 2  Sitting and talking to someone 1  Sitting quietly after a lunch without alcohol 1  In a car, while stopped for a few minutes in traffic 0  Total score 8     ALLERGIES: Allergies  Allergen Reactions   Bentyl [Dicyclomine] Other (See Comments)    Patient states it caused leg and arm spasms.   Ivp Dye [Iodinated Contrast Media] Anaphylaxis   Antihistamines, Loratadine-Type Other (See Comments)    Sleepy   Ciprofloxacin Nausea And Vomiting   Dymista [Azelastine-Fluticasone] Other (See Comments)    Heachache   Gluten Meal    Lortab [Hydrocodone-Acetaminophen] Nausea And Vomiting   Macrobid [Nitrofurantoin] Other (See Comments)    Unknown   Montelukast Other (See Comments)    Sleep   Nsaids     Pt can tolerate small amounts of Aleve "mess my stomach up, have had ulcer in the past"   Nurtec [Rimegepant Sulfate] Nausea Only   Other     Patient not sure about these allergies  Darvocet-   Mermantin- "makes me sick on my stomach"  Bio Citrate Calcium with Vitamin D- "hurt stomach"      Pepcid [Famotidine] Other (See Comments)    Headache   Prednisone     "makes me hyper and  gives me a headache"   Prilosec [Omeprazole] Other (See Comments)    Headache   Rosuvastatin Calcium Other (See Comments)    Headaches.   Vantin [Cefpodoxime] Nausea And Vomiting   Zofran [Ondansetron] Other (See Comments)    Causes headaches per pt    HOME MEDICATIONS: Outpatient Medications Prior to Visit  Medication Sig Dispense Refill   buPROPion (WELLBUTRIN XL) 150 MG 24 hr tablet Take 1 tablet (150 mg total) by mouth daily. 90 tablet 1   buPROPion ER (WELLBUTRIN SR) 100 MG 12 hr tablet TAKE 1 TABLET BY MOUTH EVERY AFTERNOON 90 tablet 1   Eszopiclone 3 MG  TABS Take 1 tablet (3 mg total) by mouth at bedtime. Take immediately before bedtime 30 tablet 4   levothyroxine (SYNTHROID) 50 MCG tablet Take 50 mcg by mouth daily before breakfast.     LORazepam (ATIVAN) 0.5 MG tablet Take 1 tablet (0.5 mg total) by mouth every 8 (eight) hours. 30 tablet 5   Magnesium 200 MG TABS Take 200 mg by mouth daily.      naratriptan (AMERGE) 2.5 MG tablet Take 1 tablet (2.5 mg total) by mouth as needed for migraine. Take one (1) tablet at onset of headache; if returns or does not resolve, may repeat after 4 hours; do not exceed five (5) mg in 24 hours. 10 tablet 0   promethazine (PHENERGAN) 25 MG tablet Take 1 tablet (25 mg total) by mouth every 6 (six) hours as needed for nausea or vomiting. 12 tablet 0   promethazine (PHENERGAN) 25 MG tablet Take by mouth.     SUMAtriptan (IMITREX) 100 MG tablet Take 1 tablet (100 mg total) by mouth once as needed for up to 1 dose for migraine. May repeat once in 2 hours if headache persists or recurs. PLEASE KEEP UPCOMING APPT FOR CONTINUED REFILLS 12 tablet 11   topiramate (TOPAMAX) 50 MG tablet Take 3 tablets (150 mg total) by mouth at bedtime. 270 tablet 3   Vitamin D, Ergocalciferol, (DRISDOL) 1.25 MG (50000 UNIT) CAPS capsule Take 50,000 Units by mouth once a week.     No facility-administered medications prior to visit.    PAST MEDICAL HISTORY: Past Medical  History:  Diagnosis Date   Allergic rhinitis    Chronic kidney disease    Decreased bone density    Depression    GERD (gastroesophageal reflux disease)    Hypothyroid    Iron deficiency anemia    Low vitamin D level    Migraine    Osteoporosis     PAST SURGICAL HISTORY: Past Surgical History:  Procedure Laterality Date   BIOPSY  03/10/2019   Procedure: BIOPSY;  Surgeon: Malissa Hippo, MD;  Location: [facility];  Service: Endoscopy;;  gastric    BIOPSY  03/24/2020   Procedure: BIOPSY;  Surgeon: Malissa Hippo, MD;  Location: [facility];  Service: Endoscopy;;  duodenum gastric   COLONOSCOPY WITH PROPOFOL N/A 03/24/2020   Procedure: COLONOSCOPY WITH PROPOFOL;  Surgeon: Malissa Hippo, MD;  Location: [facility];  Service: Endoscopy;  Laterality: N/A;  1225   ESOPHAGOGASTRODUODENOSCOPY N/A 03/10/2019   Procedure: ESOPHAGOGASTRODUODENOSCOPY (EGD);  Surgeon: Malissa Hippo, MD;  Location: [facility];  Service: Endoscopy;  Laterality: N/A;  2:40-rescheduled 10/5 @11 :55am per office   ESOPHAGOGASTRODUODENOSCOPY (EGD) WITH PROPOFOL N/A 03/24/2020   Procedure: ESOPHAGOGASTRODUODENOSCOPY (EGD) WITH PROPOFOL;  Surgeon: 03/26/2020, MD;  Location: [facility];  Service: Endoscopy;  Laterality: N/A;   hemroidectomy  1990   OVARIAN CYST SURGERY  1972   SHOULDER SURGERY Left 11/2017   TUBAL LIGATION  1997   uterine polyp removal  05/2015    FAMILY HISTORY: Family History  Problem Relation Age of Onset   Anxiety disorder Mother    Depression Mother    Bone cancer Mother    OCD Mother    Cerebral aneurysm Maternal Uncle    Drug abuse Brother    Depression Brother     SOCIAL HISTORY: Social History   Socioeconomic History   Marital status: Married    Spouse name: Not on file   Number of children: 1  Years of education: Not on file   Highest education level: High school graduate  Occupational History   Occupation: disabled  Tobacco Use   Smoking  status: Former   Smokeless tobacco: Never  Building services engineer Use: Never used  Substance and Sexual Activity   Alcohol use: No   Drug use: No   Sexual activity: Not on file  Other Topics Concern   Not on file  Social History Narrative   Lives at home with husband   Right handed   No caffeine   Social Determinants of Health   Financial Resource Strain: Not on file  Food Insecurity: Not on file  Transportation Needs: Not on file  Physical Activity: Not on file  Stress: Not on file  Social Connections: Not on file  Intimate Partner Violence: Not on file      PHYSICAL EXAM  There were no vitals filed for this visit.    There is no height or weight on file to calculate BMI.  Generalized: Well developed, in no acute distress  Cardiology: normal rate and rhythm, no murmur noted Neurological examination  Mentation: Alert oriented to time, place, history taking. Follows all commands speech and language fluent Cranial nerve II-XII: Pupils were equal round reactive to light. Extraocular movements were full, visual field were full on confrontational test. Facial sensation and strength were normal. Head turning and shoulder shrug  were normal and symmetric. Motor: The motor testing reveals 5 over 5 strength of all 4 extremities. Good symmetric motor tone is noted throughout.  Gait and station: Gait is normal.   DIAGNOSTIC DATA (LABS, IMAGING, TESTING) - I reviewed patient records, labs, notes, testing and imaging myself where available.      No data to display           Lab Results  Component Value Date   WBC 3.8 (L) 03/08/2020   HGB 14.6 03/08/2020   HCT 43.5 03/08/2020   MCV 88.4 03/08/2020   PLT 141 (L) 03/08/2020      Component Value Date/Time   NA 139 03/08/2020 2241   K 4.0 03/08/2020 2241   CL 109 03/08/2020 2241   CO2 22 03/08/2020 2241   GLUCOSE 97 03/08/2020 2241   BUN 17 03/08/2020 2241   CREATININE 0.96 03/08/2020 2241   CALCIUM 9.3 03/08/2020  2241   PROT 6.7 03/08/2020 2241   ALBUMIN 4.3 03/08/2020 2241   AST 20 03/08/2020 2241   ALT 14 03/08/2020 2241   ALKPHOS 78 03/08/2020 2241   BILITOT 0.5 03/08/2020 2241   GFRNONAA >60 03/08/2020 2241   GFRAA >60 03/08/2020 2241   No results found for: "CHOL", "HDL", "LDLCALC", "LDLDIRECT", "TRIG", "CHOLHDL" No results found for: "HGBA1C" No results found for: "VITAMINB12" Lab Results  Component Value Date   TSH 1.911 01/17/2020      ASSESSMENT AND PLAN 66 y.o. year old female  has a past medical history of Allergic rhinitis, Chronic kidney disease, Decreased bone density, Depression, GERD (gastroesophageal reflux disease), Hypothyroid, Iron deficiency anemia, Low vitamin D level, Migraine, and Osteoporosis. here with   No diagnosis found.  Sabrina Duke reports headaches have worsened over the past 1.5 months. She has a history of intractable migraines. She does have morning headaches, dry mouth and snoring. Family history of sleep apnea. We have tried and failed multiple migraine preventatives. I have recommended she proceed with a sleep evaluation. Referral placed, today. She will continue topiramate  QHS and sumatriptan  as needed. She was  advised against regular use of abortive medications. We have discussed option of trying Emgality versus Ajovy (Amovig caused constipation) or adding Qulipta daily for prevention if headaches worsen. She is hesitant stating that she is very sensitive to meds and has significant constipation. She was encouraged to continue healthy lifestyle habits. She will follow-up with me in pending sleep eval.  She verbalizes understanding and agreement with this plan.   No orders of the defined types were placed in this encounter.    No orders of the defined types were placed in this encounter.      Debbora Presto, FNP-C 03/13/2022, 12:31 PM [facility] [address] [place], [place] [address] [phone]

## 2022-03-14 ENCOUNTER — Telehealth: Payer: Self-pay | Admitting: Psychiatry

## 2022-03-14 ENCOUNTER — Other Ambulatory Visit: Payer: Self-pay

## 2022-03-14 DIAGNOSIS — F419 Anxiety disorder, unspecified: Secondary | ICD-10-CM

## 2022-03-14 MED ORDER — LORAZEPAM 0.5 MG PO TABS: 0.5000 mg | ORAL_TABLET | Freq: Three times a day (TID) | ORAL | 0 refills | Status: DC

## 2022-03-14 NOTE — Telephone Encounter (Signed)
Should I change quantity to 60 so that she will have enough next month?

## 2022-03-14 NOTE — Telephone Encounter (Signed)
OK to refill at this time. Script is written every 8 hours prn anxiety, so she is still taking script within the parameters of the directions and it should be eligible for refill at this time.

## 2022-03-14 NOTE — Telephone Encounter (Signed)
Pt called at 11:05a.  She asked for a refill of Ativan. She said she is taking 2 a day instead of 1 and therefore she only has 2 more days left. Pls send to [facility].  Next appt 2/21

## 2022-03-14 NOTE — Telephone Encounter (Signed)
Pended.

## 2022-03-14 NOTE — Telephone Encounter (Signed)
On 9/18 pt asked for early refill due to running out early taking 2 daily.In that note it stated "Pt stated she has had to take 2/d due to life and circumstances. Plan on decreasing back to 1/d this month." I don't think pt has discussed with you taking 2 daily because there is no other notes about it.Please advise if she can get rx early or if dose needs to be changed

## 2022-03-15 ENCOUNTER — Ambulatory Visit: Payer: Medicare Other | Admitting: Family Medicine

## 2022-04-17 ENCOUNTER — Telehealth: Payer: Self-pay

## 2022-04-17 ENCOUNTER — Other Ambulatory Visit: Payer: Self-pay

## 2022-04-17 DIAGNOSIS — F419 Anxiety disorder, unspecified: Secondary | ICD-10-CM

## 2022-04-17 MED ORDER — LORAZEPAM 0.5 MG PO TABS: 0.5000 mg | ORAL_TABLET | Freq: Three times a day (TID) | ORAL | 2 refills | Status: DC

## 2022-04-17 NOTE — Telephone Encounter (Signed)
Sabrina Duke called to say that she got a RF of Ativan at her pharmacy, but it was for her old dose, 1 per day. She needs a new RF sent in with her new dose 2 x per day. She will pick it up when this one runs out [facility] in [place], [place]

## 2022-04-17 NOTE — Telephone Encounter (Signed)
Pended.

## 2022-04-20 ENCOUNTER — Ambulatory Visit: Payer: Medicare Other | Admitting: Neurology

## 2022-04-20 ENCOUNTER — Encounter: Payer: Self-pay | Admitting: Neurology

## 2022-04-20 VITALS — BP 122/69 | HR 82 | Ht 64.0 in | Wt 143.5 lb

## 2022-04-20 DIAGNOSIS — F329 Major depressive disorder, single episode, unspecified: Secondary | ICD-10-CM

## 2022-04-20 DIAGNOSIS — R519 Headache, unspecified: Secondary | ICD-10-CM | POA: Diagnosis not present

## 2022-04-20 DIAGNOSIS — R0683 Snoring: Secondary | ICD-10-CM | POA: Diagnosis not present

## 2022-04-20 MED ORDER — NARATRIPTAN HCL 2.5 MG PO TABS: 2.5000 mg | ORAL_TABLET | ORAL | 0 refills | Status: DC | PRN

## 2022-04-20 NOTE — Patient Instructions (Signed)
Healthy Living: Sleep In this video, you will learn why sleep is an important part of a healthy lifestyle. To view the content, go to this web address: [email or web address].[email or web address]  This video will expire on: 02/08/2024. If you need access to this video following this date, please reach out to the healthcare provider who assigned it to you. This information is not intended to replace advice given to you by your health care provider. Make sure you discuss any questions you have with your health care provider. Elsevier Patient Education  2023 [employer]  

## 2022-04-20 NOTE — Addendum Note (Signed)
Addended by: Melvyn Novas on: 04/20/2022 03:41 PM   Modules accepted: Orders

## 2022-04-20 NOTE — Progress Notes (Signed)
SLEEP MEDICINE CLINIC    Provider:  Melvyn Novas, MD  Primary Care Physician:  Group, [facility] [address] [place] [address]     Referring Provider: Primary Neurologist: Dr Lucia Gaskins, MD     Shawnie Dapper, Np [address] [place],  [place] [address]          Chief Complaint according to patient   Patient presents with:     New Patient (Initial Visit)     Patient of Dr Trevor Mace with possible sleep related headaches.       HISTORY OF PRESENT ILLNESS:  Sabrina Duke is a 66 -year -old Caucasian female patient who is seen here upon Amy Lomax's  referral on 04/20/2022. Chief concern according to patient :  " I have the patient had her last visit with Amy Lomax on 12-15-2021 since March 23 she reported about 8 migraine days per month, she is on topiramate 150 mg daily she also takes sumatriptan for abortion of headaches at 100 mg.  Through June she had more headaches than before.  Her headaches fulfill a regular migraine quality but there is also bilateral maxillary and frontal pain.  Usually she has a nagging headache in the morning which worsens throughout the day.  Sometimes she wakes up with headaches in the morning when she is also nauseated and sensitive to light and sound and usually feels slightly hoarse.    Sabrina Duke  has a past medical history of Allergic rhinitis, Chronic kidney disease, Decreased bone density, Depression, GERD (gastroesophageal reflux disease), Hypothyroid, Iron deficiency anemia, Low vitamin D level, Migraine, and Osteoporosis.  Sleep relevant medical history: No ENT surgery but chronic sinus problems, no Tonsillectomy, no cervical spine surgery.    Family medical /sleep history: Brother was the  other family member on CPAP with OSA, he passed at age 66 of Covid in 2021, unvaccinated.    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  Pets are present. Tobacco use: none .  ETOH use ; none ,  Caffeine intake in form of Coffee( /) Soda( /) Tea ( /) or energy drinks. Regular exercise in form of  walking.        Sleep habits are as follows: The patient's dinner time is between 6-7 PM. The patient goes to bed at 1 AM ,  and continues to sleep for 8 hours, wakes rarely for bathroom breaks,. She sleeps supine and wakes up snoring, right arm goes to sleep- has left hip pain, too.  Snoring. Restless sleeper- husband has left the bedroom , which is otherwise cool, quiet and dark.  The preferred sleep position is supine , with the support of 1 pillow, flat bed. . Dreams are reportedly rare/ infrequent  9.30  AM is the usual rise time. The patient wakes up spontaneously at 8 AM.  She reports not feeling refreshed or restored in AM, with symptoms such as dry mouth, morning headaches, and residual fatigue.  Naps are taken infrequently, since being on wellbutrin.   Review of Systems: Out of a complete 14 system review, the patient complains of only the following symptoms, and all other reviewed systems are negative.:  Fatigue, sleepiness , snoring, fragmented sleep, restless sleep, headaches in AM. She is not woken by headaches    How likely are you to doze in  the following situations: 0 = not likely, 1 = slight chance, 2 = moderate chance, 3 = high chance   Sitting and Reading? Watching Television? Sitting inactive in a public place (theater or meeting)? As a passenger in a car for an hour without a break? Lying down in the afternoon when circumstances permit? Sitting and talking to someone? Sitting quietly after lunch without alcohol? In a car, while stopped for a few minutes in traffic?   Total =  11/ 24 points   FSS endorsed at 57/ 63 points.  GDS 6/ 15 points.    Social History   Socioeconomic History   Marital status: Married    Spouse name: Not on file   Number of children: 1   Years of education: Not on file    Highest education level: High school graduate  Occupational History   Occupation: disabled  Tobacco Use   Smoking status: Former   Smokeless tobacco: Never  Building services engineer Use: Never used  Substance and Sexual Activity   Alcohol use: No   Drug use: No   Sexual activity: Not on file  Other Topics Concern   Not on file  Social History Narrative   Lives at home with husband   Right handed   No caffeine   Social Determinants of Health   Financial Resource Strain: Not on file  Food Insecurity: Not on file  Transportation Needs: Not on file  Physical Activity: Not on file  Stress: Not on file  Social Connections: Not on file    Family History  Problem Relation Age of Onset   Anxiety disorder Mother    Depression Mother    Bone cancer Mother    OCD Mother    Cerebral aneurysm Maternal Uncle    Drug abuse Brother    Depression Brother     Past Medical History:  Diagnosis Date   Allergic rhinitis    Chronic kidney disease    Decreased bone density    Depression    GERD (gastroesophageal reflux disease)    Hypothyroid    Iron deficiency anemia    Low vitamin D level    Migraine    Osteoporosis     Past Surgical History:  Procedure Laterality Date   BIOPSY  03/10/2019   Procedure: BIOPSY;  Surgeon: Malissa Hippo, MD;  [facility];  Service: Endoscopy;;  gastric    BIOPSY  03/24/2020   Procedure: BIOPSY;  Surgeon: Malissa Hippo, MD;  [facility];  Service: Endoscopy;;  duodenum gastric   COLONOSCOPY WITH PROPOFOL N/A 03/24/2020   Procedure: COLONOSCOPY WITH PROPOFOL;  Surgeon: Malissa Hippo, MD;  [facility];  Service: Endoscopy;  Laterality: N/A;  1225   ESOPHAGOGASTRODUODENOSCOPY N/A 03/10/2019   Procedure: ESOPHAGOGASTRODUODENOSCOPY (EGD);  Surgeon: Malissa Hippo, MD;  [facility];  Service: Endoscopy;  Laterality: N/A;  2:40-rescheduled 10/5 @11 :55am per office   ESOPHAGOGASTRODUODENOSCOPY (EGD)  WITH PROPOFOL N/A 03/24/2020   Procedure: ESOPHAGOGASTRODUODENOSCOPY (EGD) WITH PROPOFOL;  Surgeon: 03/26/2020, MD;  [facility];  Service: Endoscopy;  Laterality: N/A;   hemroidectomy  1990   OVARIAN CYST SURGERY  1972   SHOULDER SURGERY Left 11/2017   TUBAL LIGATION  1997   uterine polyp removal  05/2015     Current Outpatient Medications on File Prior to Visit  Medication Sig Dispense Refill   buPROPion (WELLBUTRIN XL) 150 MG 24 hr tablet Take 1 tablet (150  mg total) by mouth daily. 90 tablet 1   buPROPion ER (WELLBUTRIN SR) 100 MG 12 hr tablet TAKE 1 TABLET BY MOUTH EVERY AFTERNOON 90 tablet 1   Eszopiclone 3 MG TABS Take 1 tablet (3 mg total) by mouth at bedtime. Take immediately before bedtime 30 tablet 4   levothyroxine (SYNTHROID) 50 MCG tablet Take 50 mcg by mouth daily before breakfast.     LORazepam (ATIVAN) 0.5 MG tablet Take 1 tablet (0.5 mg total) by mouth every 8 (eight) hours. 60 tablet 2   Magnesium 200 MG TABS Take 200 mg by mouth daily.      naratriptan (AMERGE) 2.5 MG tablet Take 1 tablet (2.5 mg total) by mouth as needed for migraine. Take one (1) tablet at onset of headache; if returns or does not resolve, may repeat after 4 hours; do not exceed five (5) mg in 24 hours. 10 tablet 0   promethazine (PHENERGAN) 25 MG tablet Take 1 tablet (25 mg total) by mouth every 6 (six) hours as needed for nausea or vomiting. 12 tablet 0   promethazine (PHENERGAN) 25 MG tablet Take by mouth.     SUMAtriptan (IMITREX) 100 MG tablet Take 1 tablet (100 mg total) by mouth once as needed for up to 1 dose for migraine. May repeat once in 2 hours if headache persists or recurs. PLEASE KEEP UPCOMING APPT FOR CONTINUED REFILLS 12 tablet 11   topiramate (TOPAMAX) 50 MG tablet Take 3 tablets (150 mg total) by mouth at bedtime. 270 tablet 3   Vitamin D, Ergocalciferol, (DRISDOL) 1.25 MG (50000 UNIT) CAPS capsule Take 50,000 Units by mouth once a week.     No current  facility-administered medications on file prior to visit.    Allergies  Allergen Reactions   Bentyl [Dicyclomine] Other (See Comments)    Patient states it caused leg and arm spasms.   Ivp Dye [Iodinated Contrast Media] Anaphylaxis   Antihistamines, Loratadine-Type Other (See Comments)    Sleepy   Ciprofloxacin Nausea And Vomiting   Dymista [Azelastine-Fluticasone] Other (See Comments)    Heachache   Gluten Meal    Lortab [Hydrocodone-Acetaminophen] Nausea And Vomiting   Macrobid [Nitrofurantoin] Other (See Comments)    Unknown   Montelukast Other (See Comments)    Sleep   Nsaids     Pt can tolerate small amounts of Aleve "mess my stomach up, have had ulcer in the past"   Nurtec [Rimegepant Sulfate] Nausea Only   Other     Patient not sure about these allergies  Darvocet-   Mermantin- "makes me sick on my stomach"  Bio Citrate Calcium with Vitamin D- "hurt stomach"      Pepcid [Famotidine] Other (See Comments)    Headache   Prednisone     "makes me hyper and gives me a headache"   Prilosec [Omeprazole] Other (See Comments)    Headache   Rosuvastatin Calcium Other (See Comments)    Headaches.   Vantin [Cefpodoxime] Nausea And Vomiting   Zofran [Ondansetron] Other (See Comments)    Causes headaches per pt    Physical exam:  Today's Vitals   04/20/22 1502  BP: 122/69  Pulse: 82  Weight: 143 lb 8 oz (65.1 kg)  Height: 5\' 4"  (1.626 m)   Body mass index is 24.63 kg/m.   Wt Readings from Last 3 Encounters:  04/20/22 143 lb 8 oz (65.1 kg)  12/15/21 140 lb (63.5 kg)  08/22/21 142 lb (64.4 kg)     Ht Readings  from Last 3 Encounters:  04/20/22 5\' 4"  (1.626 m)  12/15/21 5\' 4"  (1.626 m)  08/22/21 5\' 4"  (1.626 m)      General: The patient is awake, alert and appears not in acute distress. The patient is well groomed. Head: Normocephalic, atraumatic. Neck is supple.  Mallampati 2,  neck circumference:14 inches .  Nasal airflow  patent.  Retrognathia is   seen.  Dental status: biological  Cardiovascular:  Regular rate and cardiac rhythm by pulse,  without distended neck veins. Respiratory: Lungs are clear to auscultation.  Skin:  Without evidence of ankle edema, or rash. Trunk: The patient's posture is erect.   Neurologic exam : The patient is awake and alert, oriented to place and time.   Memory subjective described as intact.  Attention span & concentration ability appears normal.  Speech is fluent,  without  dysarthria, dysphonia or aphasia.  Mood and affect are appropriate.   Cranial nerves: no loss of smell or taste reported  Pupils are equal and briskly reactive to light. Funduscopic exam deferred. .  Extraocular movements in vertical and horizontal planes were intact and without nystagmus. No Diplopia.  Facial motor strength is symmetric and tongue and uvula move midline.  Neck ROM : rotation, tilt and flexion extension were normal for age and shoulder shrug was symmetrical.    Motor exam:  Symmetric bulk, tone and ROM.   Normal tone without cog-wheeling, symmetric grip strength .   Sensory:  Fine touch, pinprick and vibration were tested  and  normal.  Proprioception tested in the upper extremities was normal.   Coordination: Rapid alternating movements in the fingers/hands were of normal speed.  The Finger-to-nose maneuver was intact without evidence of ataxia, dysmetria or tremor.   Gait and station: Patient could rise unassisted from a seated position, walked without assistive device.  Stance is of normal width/ base and the patient turned with 3 steps.  Toe and heel walk were deferred.  Deep tendon reflexes: in the upper and lower extremities are symmetric and intact.  Babinski response was deferred.       After spending a total time of  40  minutes face to face and additional time for physical and neurologic examination, review of laboratory studies,  personal review of imaging studies, reports and results of other  testing and review of referral information / records as far as provided in visit, I have established the following assessments:  1)  excessive daytime sleepiness 2)  high degree of fatigue  3) snoring  4) morning headaches.    My Plan is to proceed with:  1) Screening for Sleep Apnea, hypoxia as cause of morning headaches, fatigue and sleepiness.  2) stay 8 hours in bed, set a rise time and bedtime.  3) depression and fatigue go hand in hand.   I would like to thank Group, [facility] and CrownLomax, Amy, Np [address] [place],  [place] [id number] for allowing me to meet with and to take care of this pleasant patient.   In short, Sabrina Duke is presenting with morning headaches, a symptom that can be attributed to OSA. I plan to follow up either personally or through our NP within 4-5 months.   CC: I will share my notes with PCP and dr Lucia GaskinsAhern. .  Electronically signed by: Melvyn Novasarmen Ante Arredondo, MD 04/20/2022 3:09 PM  [facility] certified by The [employer] of Sleep Medicine and Diplomate of the [employer] Academy  of Sleep Medicine. Board certified In Neurology through the [place], Fellow of the [employer] of Neurology. Medical Director of [employer].

## 2022-05-02 ENCOUNTER — Telehealth: Payer: Self-pay | Admitting: Neurology

## 2022-05-02 NOTE — Telephone Encounter (Signed)
[employer] medicare [employer] pending faxed notes

## 2022-06-06 NOTE — Telephone Encounter (Signed)
LVM for pt to call back to schedule   [employer] medicare auth: [id number] (exp. 05/02/22 to 10/28/22)

## 2022-06-20 NOTE — Telephone Encounter (Signed)
LVM for pt to call back to schedule sleep study.   We have attempted to call the patient two times to schedule sleep study.  Patient has been unavailable at the phone numbers we have on file and has not returned our calls.  If patient calls back we will schedule them for their sleep study.  

## 2022-07-17 ENCOUNTER — Telehealth: Payer: Self-pay | Admitting: Neurology

## 2022-07-17 ENCOUNTER — Other Ambulatory Visit: Payer: Self-pay

## 2022-07-17 MED ORDER — NARATRIPTAN HCL 2.5 MG PO TABS: 2.5000 mg | ORAL_TABLET | ORAL | 0 refills | Status: DC | PRN

## 2022-07-17 NOTE — Telephone Encounter (Signed)
Pt is requesting a refill for naratriptan (AMERGE) 2.5 MG tablet.  Pharmacy: [facility]

## 2022-07-17 NOTE — Telephone Encounter (Signed)
Refill sent.

## 2022-07-19 ENCOUNTER — Telehealth: Payer: Self-pay | Admitting: Family Medicine

## 2022-07-19 NOTE — Telephone Encounter (Signed)
LVM and sent mychart msg informing pt of need to reschedule 08/23/22 appointment - NP out

## 2022-07-20 ENCOUNTER — Other Ambulatory Visit: Payer: Self-pay | Admitting: Psychiatry

## 2022-07-20 DIAGNOSIS — F5101 Primary insomnia: Secondary | ICD-10-CM

## 2022-07-21 ENCOUNTER — Other Ambulatory Visit: Payer: Self-pay | Admitting: Psychiatry

## 2022-07-21 DIAGNOSIS — F419 Anxiety disorder, unspecified: Secondary | ICD-10-CM

## 2022-07-21 NOTE — Telephone Encounter (Signed)
Next visit is 07/27/22. Requesting refill on her Lunesta. Please call to:  [facility], [place] [address]   Phone: [phone]  Fax: [phone]

## 2022-07-26 ENCOUNTER — Ambulatory Visit: Payer: Medicare Other | Admitting: Psychiatry

## 2022-07-27 ENCOUNTER — Ambulatory Visit: Payer: Medicare Other | Admitting: Psychiatry

## 2022-07-27 ENCOUNTER — Encounter: Payer: Self-pay | Admitting: Psychiatry

## 2022-07-27 DIAGNOSIS — F419 Anxiety disorder, unspecified: Secondary | ICD-10-CM | POA: Diagnosis not present

## 2022-07-27 DIAGNOSIS — F3342 Major depressive disorder, recurrent, in full remission: Secondary | ICD-10-CM | POA: Diagnosis not present

## 2022-07-27 DIAGNOSIS — F330 Major depressive disorder, recurrent, mild: Secondary | ICD-10-CM

## 2022-07-27 DIAGNOSIS — F5101 Primary insomnia: Secondary | ICD-10-CM | POA: Diagnosis not present

## 2022-07-27 MED ORDER — LORAZEPAM 0.5 MG PO TABS: ORAL_TABLET | ORAL | 5 refills | Status: DC

## 2022-07-27 MED ORDER — ESZOPICLONE 3 MG PO TABS: ORAL_TABLET | ORAL | 5 refills | Status: DC

## 2022-07-27 MED ORDER — BUPROPION HCL ER (XL) 150 MG PO TB24: 150.0000 mg | ORAL_TABLET | Freq: Every day | ORAL | 1 refills | Status: DC

## 2022-07-27 MED ORDER — BUPROPION HCL ER (SR) 100 MG PO TB12: ORAL_TABLET | ORAL | 1 refills | Status: DC

## 2022-07-27 NOTE — Progress Notes (Signed)
Sabrina Duke [id number] [birth year] 67 y.o.  Subjective:   Patient ID:  Sabrina Duke is a 67 y.o. (DOB 02/08/1956) female.  Chief Complaint:  Chief Complaint  Patient presents with   Follow-up    Depression, Anxiety ,and insomnia    HPI Sabrina Duke presents to the office today for follow-up of depression, anxiety, and insomnia. She reports that she was found to have low T3. Her dermatologist started her on Liothyronine and hair loss improved. Hair loss re-occurred and Liothyronine increased to 10 mcg daily a week ago and hair loss improved. She reports that her headaches have improved. She reports that she is feeling better in general. She reports improved energy and motivation. She reports that she is more productive and no longer getting fatigued during errands. Mood has improved. Denies depressed mood. Anxiety has been "good." Concentration is adequate. Denies SI.   She reports that she gained about 20 lbs in 2 years. She reports that her appetite has been good. Sleeping well. Typically sleeps from 1 pm-9:30 am.   She reports that her mother had altered mental status with hallucinations after a medication. Mother's mental status has improved somewhat.   Taking Ativan mid-day and 8 pm. Takes Lunesta before bedtime around 11 pm-midnight.   Lorazepam was last filled 07/24/22 Lunesta last filled 07/21/22.  Past medication trials: Wellbutrin- Effective, well tolerated Trintellix-GI side effects Pristiq- somewhat helpful Lexapro Celexa Prozac- Helped, caused headaches Cymbalta Sertraline Rexulti Vraylar-jittery, hallucinations Latuda-joint aches Abilify-weight gain Lamictal BuSpar- Weight gain Topamax Trazodone-excessive somnolence Lunesta Ambien-ineffective Adderall XR Ativan-Effective   PHQ2-9    Flowsheet Row Nutrition from 05/11/2020 in [facility] at [facility] Total Score 0      [facility] from 08/30/2020 in  [facility] No Risk        Review of Systems:  Review of Systems  Musculoskeletal:  Negative for gait problem.  Neurological:  Negative for tremors.       Improved headaches  Psychiatric/Behavioral:         Please refer to HPI     She reports that she had a severe virus for several weeks in December.   Medications: I have reviewed the patient's current medications.  Current Outpatient Medications  Medication Sig Dispense Refill   levothyroxine (SYNTHROID) 50 MCG tablet Take 50 mcg by mouth daily before breakfast.     Magnesium 200 MG TABS Take 200 mg by mouth daily.      naratriptan (AMERGE) 2.5 MG tablet Take 1 tablet (2.5 mg total) by mouth as needed for migraine. Take one (1) tablet at onset of headache; if returns or does not resolve, may repeat after 4 hours; do not exceed five (5) mg in 24 hours. 10 tablet 0   promethazine (PHENERGAN) 25 MG tablet Take 1 tablet (25 mg total) by mouth every 6 (six) hours as needed for nausea or vomiting. 12 tablet 0   SUMAtriptan (IMITREX) 100 MG tablet Take 1 tablet (100 mg total) by mouth once as needed for up to 1 dose for migraine. May repeat once in 2 hours if headache persists or recurs. PLEASE KEEP UPCOMING APPT FOR CONTINUED REFILLS 12 tablet 11   topiramate (TOPAMAX) 50 MG tablet Take 3 tablets (150 mg total) by mouth at bedtime. 270 tablet 3   Vitamin D, Ergocalciferol, (DRISDOL) 1.25 MG (50000 UNIT) CAPS capsule Take 50,000 Units by mouth once  a week.     buPROPion (WELLBUTRIN XL) 150 MG 24 hr tablet Take 1 tablet (150 mg total) by mouth daily. 90 tablet 1   buPROPion ER (WELLBUTRIN SR) 100 MG 12 hr tablet TAKE 1 TABLET BY MOUTH EVERY AFTERNOON 90 tablet 1   [START ON 08/18/2022] Eszopiclone 3 MG TABS TAKE 1 TABLET BY MOUTH IMMEDIATELY BEFORE BEDTIME 30 tablet 5   [START ON 08/21/2022] LORazepam (ATIVAN) 0.5 MG tablet Take 1/2-1 tab twice daily as needed 60 tablet 5    promethazine (PHENERGAN) 25 MG tablet Take by mouth.     No current facility-administered medications for this visit.    Medication Side Effects: None  Allergies:  Allergies  Allergen Reactions   Bentyl [Dicyclomine] Other (See Comments)    Patient states it caused leg and arm spasms.   Ivp Dye [Iodinated Contrast Media] Anaphylaxis   Antihistamines, Loratadine-Type Other (See Comments)    Sleepy   Ciprofloxacin Nausea And Vomiting   Dymista [Azelastine-Fluticasone] Other (See Comments)    Heachache   Gluten Meal    Lortab [Hydrocodone-Acetaminophen] Nausea And Vomiting   Macrobid [Nitrofurantoin] Other (See Comments)    Unknown   Montelukast Other (See Comments)    Sleep   Nsaids     Pt can tolerate small amounts of Aleve "mess my stomach up, have had ulcer in the past"   Nurtec [Rimegepant Sulfate] Nausea Only   Other     Patient not sure about these allergies  Darvocet-   Mermantin- "makes me sick on my stomach"  Bio Citrate Calcium with Vitamin D- "hurt stomach"      Pepcid [Famotidine] Other (See Comments)    Headache   Prednisone     "makes me hyper and gives me a headache"   Prilosec [Omeprazole] Other (See Comments)    Headache   Rosuvastatin Calcium Other (See Comments)    Headaches.   Vantin [Cefpodoxime] Nausea And Vomiting   Zofran [Ondansetron] Other (See Comments)    Causes headaches per pt    Past Medical History:  Diagnosis Date   Allergic rhinitis    Chronic kidney disease    Decreased bone density    Depression    GERD (gastroesophageal reflux disease)    Hypothyroid    Iron deficiency anemia    Low vitamin D level    Migraine    Osteoporosis     Past Medical History, Surgical history, Social history, and Family history were reviewed and updated as appropriate.   Please see review of systems for further details on the patient's review from today.   Objective:   Physical Exam:  There were no vitals taken for this  visit.  Physical Exam Constitutional:      General: She is not in acute distress. Musculoskeletal:        General: No deformity.  Neurological:     Mental Status: She is alert and oriented to person, place, and time.     Coordination: Coordination normal.  Psychiatric:        Attention and Perception: Attention and perception normal. She does not perceive auditory or visual hallucinations.        Mood and Affect: Mood normal. Mood is not anxious or depressed. Affect is not labile, blunt, angry or inappropriate.        Speech: Speech normal.        Behavior: Behavior normal.        Thought Content: Thought content normal. Thought content is not paranoid or  delusional. Thought content does not include homicidal or suicidal ideation. Thought content does not include homicidal or suicidal plan.        Cognition and Memory: Cognition and memory normal.        Judgment: Judgment normal.     Comments: Insight intact     Lab Review:     Component Value Date/Time   NA 139 03/08/2020 2241   K 4.0 03/08/2020 2241   CL 109 03/08/2020 2241   CO2 22 03/08/2020 2241   GLUCOSE 97 03/08/2020 2241   BUN 17 03/08/2020 2241   CREATININE 0.96 03/08/2020 2241   CALCIUM 9.3 03/08/2020 2241   PROT 6.7 03/08/2020 2241   ALBUMIN 4.3 03/08/2020 2241   AST 20 03/08/2020 2241   ALT 14 03/08/2020 2241   ALKPHOS 78 03/08/2020 2241   BILITOT 0.5 03/08/2020 2241   GFRNONAA >60 03/08/2020 2241   GFRAA >60 03/08/2020 2241       Component Value Date/Time   WBC 3.8 (L) 03/08/2020 2241   RBC 4.92 03/08/2020 2241   HGB 14.6 03/08/2020 2241   HCT 43.5 03/08/2020 2241   PLT 141 (L) 03/08/2020 2241   MCV 88.4 03/08/2020 2241   MCH 29.7 03/08/2020 2241   MCHC 33.6 03/08/2020 2241   RDW 12.5 03/08/2020 2241   LYMPHSABS 0.9 01/20/2020 0354   MONOABS 0.3 01/20/2020 0354   EOSABS 0.1 01/20/2020 0354   BASOSABS 0.0 01/20/2020 0354    No results found for: "POCLITH", "LITHIUM"   No results found for:  "PHENYTOIN", "PHENOBARB", "VALPROATE", "CBMZ"   .res Assessment: Plan:    Pt seen for 25 minutes and time spent discussing symptoms in response to treatment of thyroid disorder. She reports that her depression is currently in remission. Discussed attempting to gradually reduce Lorazepam prn to minimize risk of dependence and tolerance. Recommend reducing Lorazepam by 1/2 of a 0.5 mg tab every 2 weeks.  Continue Wellbutrin XL 150 mg po qd for depression.  Continue Wellbutrin SR 100 mg po q afternoon for depression.  Continue Lunesta 3 mg po QHS for insomnia.  Pt to follow-up in 6 months or sooner if clinically indicated.  Patient advised to contact office with any questions, adverse effects, or acute worsening in signs and symptoms.   Sabrina Duke was seen today for follow-up.  Diagnoses and all orders for this visit:  Recurrent major depressive disorder, in full remission ([place]) -     buPROPion ER (WELLBUTRIN SR) 100 MG 12 hr tablet; TAKE 1 TABLET BY MOUTH EVERY AFTERNOON -     buPROPion (WELLBUTRIN XL) 150 MG 24 hr tablet; Take 1 tablet (150 mg total) by mouth daily.  Primary insomnia -     Eszopiclone 3 MG TABS; TAKE 1 TABLET BY MOUTH IMMEDIATELY BEFORE BEDTIME  Anxiety -     LORazepam (ATIVAN) 0.5 MG tablet; Take 1/2-1 tab twice daily as needed     Please see After Visit Summary for patient specific instructions.  Future Appointments  Date Time Provider [place]  01/25/2023  2:00 PM Thayer Headings, PMHNP CP-CP None    No orders of the defined types were placed in this encounter.   -------------------------------

## 2022-08-21 ENCOUNTER — Other Ambulatory Visit: Payer: Self-pay

## 2022-08-21 DIAGNOSIS — G43709 Chronic migraine without aura, not intractable, without status migrainosus: Secondary | ICD-10-CM

## 2022-08-21 MED ORDER — SUMATRIPTAN SUCCINATE 100 MG PO TABS: 100.0000 mg | ORAL_TABLET | Freq: Once | ORAL | 2 refills | Status: DC | PRN

## 2022-08-23 ENCOUNTER — Ambulatory Visit: Payer: Medicare Other | Admitting: Family Medicine

## 2022-08-25 ENCOUNTER — Other Ambulatory Visit: Payer: Self-pay | Admitting: Psychiatry

## 2022-08-25 DIAGNOSIS — F419 Anxiety disorder, unspecified: Secondary | ICD-10-CM

## 2022-08-30 ENCOUNTER — Telehealth: Payer: Self-pay | Admitting: *Deleted

## 2022-08-30 MED ORDER — TOPIRAMATE 50 MG PO TABS: 150.0000 mg | ORAL_TABLET | Freq: Every day | ORAL | 1 refills | Status: DC

## 2022-08-30 NOTE — Telephone Encounter (Signed)
Pt last seen on 04/20/22 Follow up visit scheduled on 02/28/23

## 2022-09-27 ENCOUNTER — Other Ambulatory Visit: Payer: Self-pay | Admitting: Psychiatry

## 2022-09-27 DIAGNOSIS — F419 Anxiety disorder, unspecified: Secondary | ICD-10-CM

## 2022-09-28 ENCOUNTER — Telehealth: Payer: Self-pay | Admitting: Psychiatry

## 2022-09-29 NOTE — Telephone Encounter (Signed)
error 

## 2022-11-06 ENCOUNTER — Telehealth: Payer: Self-pay | Admitting: Psychiatry

## 2022-11-06 NOTE — Telephone Encounter (Signed)
Sabrina Duke called to say that her thyroid medication was increased recently and she has been having crying spells. She is not sure if that med is responsible but wonders about increasing the antidepressant she takes.

## 2022-11-07 ENCOUNTER — Other Ambulatory Visit: Payer: Self-pay

## 2022-11-07 NOTE — Telephone Encounter (Signed)
Does she want to try increasing Wellbutrin XL to 300 mg? She may also move up her appointment to discuss other treatment options.

## 2022-11-07 NOTE — Telephone Encounter (Signed)
LVM to [facility] 

## 2022-11-07 NOTE — Telephone Encounter (Signed)
Patient reporting daily episodes of uncontrollable crying spells, usually occur in the afternoon. No new stressors. Has hip and back pain and had been having PT, but that ends tomorrow and does not have F/U with ortho until July. Reports pain makes it difficult to sleep and is limiting her activities. She also reported her hair falling out and she labs revealed a low TSH. In addition to the levothyroxine is taking Cytomel 10 mg M-W-F. She said she sure she has some depression due to the above.

## 2022-11-08 NOTE — Telephone Encounter (Signed)
Moved her to June 6.

## 2022-11-08 NOTE — Telephone Encounter (Signed)
Please call patient to schedule an earlier appt with [name].  

## 2022-11-09 ENCOUNTER — Telehealth (INDEPENDENT_AMBULATORY_CARE_PROVIDER_SITE_OTHER): Payer: Medicare Other | Admitting: Psychiatry

## 2022-11-09 ENCOUNTER — Encounter: Payer: Self-pay | Admitting: Psychiatry

## 2022-11-09 DIAGNOSIS — F5101 Primary insomnia: Secondary | ICD-10-CM

## 2022-11-09 DIAGNOSIS — F419 Anxiety disorder, unspecified: Secondary | ICD-10-CM

## 2022-11-09 DIAGNOSIS — F331 Major depressive disorder, recurrent, moderate: Secondary | ICD-10-CM | POA: Diagnosis not present

## 2022-11-09 MED ORDER — BUPROPION HCL ER (XL) 150 MG PO TB24: 150.0000 mg | ORAL_TABLET | Freq: Every day | ORAL | 1 refills | Status: DC

## 2022-11-09 MED ORDER — BUPROPION HCL ER (SR) 150 MG PO TB12: ORAL_TABLET | ORAL | 1 refills | Status: DC

## 2022-11-09 NOTE — Progress Notes (Signed)
Sabrina Duke [id number] 1955-12-10 67 y.o.  Virtual Visit via Video Note  I connected with pt @ on 11/09/22 at 11:30 AM EDT by a video enabled telemedicine application and verified that I am speaking with the correct person using two identifiers.   I discussed the limitations of evaluation and management by telemedicine and the availability of in person appointments. The patient expressed understanding and agreed to proceed.  I discussed the assessment and treatment plan with the patient. The patient was provided an opportunity to ask questions and all were answered. The patient agreed with the plan and demonstrated an understanding of the instructions.   The patient was advised to call back or seek an in-person evaluation if the symptoms worsen or if the condition fails to improve as anticipated.  I provided 40 minutes of non-face-to-face time during this encounter.  The patient was located at home.  The provider was located at home.   Sabrina Duke, PMHNP   Subjective:   Patient ID:  Sabrina Duke is a 67 y.o. (DOB [birth year]) female.  Chief Complaint:  Chief Complaint  Patient presents with   Depression    Depression        Sabrina Duke presents for follow-up of depression, anxiety, and insomnia. She reports that she has been having hip pain and went to PT. She reports that now she is having back pain. She is unable to see medical provider until 12/05/22. She reports that she cannot take anti-inflammatories or opiates, and tylenol is no longer as effective. She has been having difficulty with her thyroid and medications have been adjusted. Liothyronine was increased about 2 weeks ago. She reports that she has had hair loss and fatigue.    She reports that last week she started having uncontrolled crying episodes- "I don't know why I'm crying." She reports that she has been more tearful. She reports that she has not had crying episodes in the past with thyroid abnormalities. She  reports that she has been having more sadness recently. She reports sadness and frustration in response to being physically limited with hip and back pain. She reports that she had difficulty getting up the stairs after grandson's baseball game and this was "discouraging." Had to hire someone to clean her house recently since she was not able to physically do it. She reports that she does not want others to know that she is not feeling well "because I am sick and tired of being sick" and does not want people to ask how she is doing. She reports some irritability. Low energy. Sleep is occasionally disrupted due to pain. She reports that her anxiety has been "ok." Denies any "anxious feelings" or chest tightness. Denies excessive worry. Appetite has been ok. She reports that she is not sure if her weight has changed. She avoids fried foods and red meats. She reports poor concentration and focus. She reports that it was severely impaired before her thyroid medication was adjusted, and that concentration has been improving "but it's still not where it used to be." Denies anhedonia. Denies SI.    She reports that she notices worsening TMJ when she has attempted to reduce Lorazepam. She has continued to take Lorazepam 0.5 mg twice daily.    Husband has been traveling to do volunteer work and is sometimes out of state for 2-3 week periods. She reports that she spends more time alone. She will periodically go to lunch with a friend and visit a cousin.  Past medication trials: Wellbutrin- Effective, well tolerated Trintellix-GI side effects Pristiq- somewhat helpful Lexapro Celexa Prozac- Helped, caused headaches Cymbalta Sertraline Rexulti Vraylar-jittery, hallucinations Latuda-joint aches Abilify-weight gain Lamictal BuSpar- Weight gain Topamax Trazodone-excessive somnolence Lunesta Ambien-ineffective Adderall XR Ativan-Effective Gabapentin- excessive somnolence  Review of Systems:  Review  of Systems  Musculoskeletal:  Positive for back pain. Negative for gait problem.       Hip pain  Neurological:  Negative for tremors.  Psychiatric/Behavioral:  Positive for depression.        Please refer to HPI    Medications: I have reviewed the patient's current medications.  Current Outpatient Medications  Medication Sig Dispense Refill   Astaxanthin 4 MG CAPS Take by mouth.     B-12 METHYLCOBALAMIN PO Take by mouth.     Eszopiclone 3 MG TABS TAKE 1 TABLET BY MOUTH IMMEDIATELY BEFORE BEDTIME 30 tablet 5   levothyroxine (SYNTHROID) 50 MCG tablet Take 50 mcg by mouth daily before breakfast.     liothyronine (CYTOMEL) 5 MCG tablet Take by mouth. Takes 2 tablets Monday, Wednesday and Fridays and 1 tablet the remaining days of the week     LORazepam (ATIVAN) 0.5 MG tablet TAKE 1 TABLET BY MOUTH EVERY 8 HOURS 60 tablet 3   Magnesium 200 MG TABS Take 200 mg by mouth daily.      naratriptan (AMERGE) 2.5 MG tablet Take 1 tablet (2.5 mg total) by mouth as needed for migraine. Take one (1) tablet at onset of headache; if returns or does not resolve, may repeat after 4 hours; do not exceed five (5) mg in 24 hours. 10 tablet 0   promethazine (PHENERGAN) 25 MG tablet Take 1 tablet (25 mg total) by mouth every 6 (six) hours as needed for nausea or vomiting. 12 tablet 0   SUMAtriptan (IMITREX) 100 MG tablet Take 1 tablet (100 mg total) by mouth once as needed for up to 1 dose for migraine. May repeat once in 2 hours if headache persists or recurs. Must make an Appointment for Future Refill Request 12 tablet 2   topiramate (TOPAMAX) 50 MG tablet Take 3 tablets (150 mg total) by mouth at bedtime. 270 tablet 1   Vitamin D, Ergocalciferol, (DRISDOL) 1.25 MG (50000 UNIT) CAPS capsule Take 50,000 Units by mouth once a week.     buPROPion (WELLBUTRIN XL) 150 MG 24 hr tablet Take 1 tablet (150 mg total) by mouth daily. 90 tablet 1   buPROPion ER (WELLBUTRIN SR) 150 MG 12 hr tablet TAKE 1 TABLET BY MOUTH EVERY  AFTERNOON 30 tablet 1   promethazine (PHENERGAN) 25 MG tablet Take by mouth.     No current facility-administered medications for this visit.    Medication Side Effects: None  Allergies:  Allergies  Allergen Reactions   Bentyl [Dicyclomine] Other (See Comments)    Patient states it caused leg and arm spasms.   Ivp Dye [Iodinated Contrast Media] Anaphylaxis   Antihistamines, Loratadine-Type Other (See Comments)    Sleepy   Ciprofloxacin Nausea And Vomiting   Dymista [Azelastine-Fluticasone] Other (See Comments)    Heachache   Gluten Meal    Lortab [Hydrocodone-Acetaminophen] Nausea And Vomiting   Macrobid [Nitrofurantoin] Other (See Comments)    Unknown   Montelukast Other (See Comments)    Sleep   Nsaids     Pt can tolerate small amounts of Aleve "mess my stomach up, have had ulcer in the past"   Nurtec [Rimegepant Sulfate] Nausea Only   Other  Patient not sure about these allergies  Darvocet-   Mermantin- "makes me sick on my stomach"  Bio Citrate Calcium with Vitamin D- "hurt stomach"      Pepcid [Famotidine] Other (See Comments)    Headache   Prednisone     "makes me hyper and gives me a headache"   Prilosec [Omeprazole] Other (See Comments)    Headache   Rosuvastatin Calcium Other (See Comments)    Headaches.   Vantin [Cefpodoxime] Nausea And Vomiting   Zofran [Ondansetron] Other (See Comments)    Causes headaches per pt    Past Medical History:  Diagnosis Date   Allergic rhinitis    Chronic kidney disease    Decreased bone density    Depression    GERD (gastroesophageal reflux disease)    Hypothyroid    Iron deficiency anemia    Low vitamin D level    Migraine    Osteoporosis     Family History  Problem Relation Age of Onset   Anxiety disorder Mother    Depression Mother    Bone cancer Mother    OCD Mother    Cerebral aneurysm Maternal Uncle    Drug abuse Brother    Depression Brother     Social History   Socioeconomic History    Marital status: Married    Spouse name: Not on file   Number of children: 1   Years of education: Not on file   Highest education level: High school graduate  Occupational History   Occupation: disabled  Tobacco Use   Smoking status: Former   Smokeless tobacco: Never  Building services engineer Use: Never used  Substance and Sexual Activity   Alcohol use: No   Drug use: No   Sexual activity: Not on file  Other Topics Concern   Not on file  Social History Narrative   Lives at home with husband   Right handed   No caffeine   Social Determinants of Corporate investment banker Strain: Not on file  Food Insecurity: Not on file  Transportation Needs: Not on file  Physical Activity: Not on file  Stress: Not on file  Social Connections: Not on file  Intimate Partner Violence: Not on file    Past Medical History, Surgical history, Social history, and Family history were reviewed and updated as appropriate.   Please see review of systems for further details on the patient's review from today.   Objective:   Physical Exam:  There were no vitals taken for this visit.  Physical Exam Neurological:     Mental Status: She is alert and oriented to person, place, and time.     Cranial Nerves: No dysarthria.  Psychiatric:        Attention and Perception: Attention and perception normal.        Mood and Affect: Mood is depressed. Mood is not anxious. Affect is tearful.        Speech: Speech normal.        Behavior: Behavior normal. Behavior is cooperative.        Thought Content: Thought content normal. Thought content is not paranoid or delusional. Thought content does not include homicidal or suicidal ideation. Thought content does not include homicidal or suicidal plan.        Cognition and Memory: Cognition and memory normal.        Judgment: Judgment normal.     Comments: Insight intact     Lab Review:  Component Value Date/Time   NA 139 03/08/2020 2241   K 4.0 03/08/2020  2241   CL 109 03/08/2020 2241   CO2 22 03/08/2020 2241   GLUCOSE 97 03/08/2020 2241   BUN 17 03/08/2020 2241   CREATININE 0.96 03/08/2020 2241   CALCIUM 9.3 03/08/2020 2241   PROT 6.7 03/08/2020 2241   ALBUMIN 4.3 03/08/2020 2241   AST 20 03/08/2020 2241   ALT 14 03/08/2020 2241   ALKPHOS 78 03/08/2020 2241   BILITOT 0.5 03/08/2020 2241   GFRNONAA >60 03/08/2020 2241   GFRAA >60 03/08/2020 2241       Component Value Date/Time   WBC 3.8 (L) 03/08/2020 2241   RBC 4.92 03/08/2020 2241   HGB 14.6 03/08/2020 2241   HCT 43.5 03/08/2020 2241   PLT 141 (L) 03/08/2020 2241   MCV 88.4 03/08/2020 2241   MCH 29.7 03/08/2020 2241   MCHC 33.6 03/08/2020 2241   RDW 12.5 03/08/2020 2241   LYMPHSABS 0.9 01/20/2020 0354   MONOABS 0.3 01/20/2020 0354   EOSABS 0.1 01/20/2020 0354   BASOSABS 0.0 01/20/2020 0354    No results found for: "POCLITH", "LITHIUM"   No results found for: "PHENYTOIN", "PHENOBARB", "VALPROATE", "CBMZ"   .res Assessment: Plan:   Discussed that recent depression may be due to underlying thyroid dysfunction and that medication could be changed or increased to possibly improve depression while thyroid medication becomes effective.  Discussed potential benefits, risks, and side effects of Auvelity. Pt reports that she would prefer to possibly increase Wellbutrin before switching medication, and would consider Auvelity if depression does not improve. Discussed that she has not tolerated increase in Wellbutrin XL to 300 mg twice in the past due to worsening headaches, which was reason for trial of Wellbutrin SR mid-day. Discussed option of increasing Wellbutrin SR from 100 mg to 150 mg mid-day to possibly improve depressive symptoms. Pt is in agreement with increase in Wellbutrin SR 150 mg mid-day and continuing Wellbutrin XL 150 mg in the morning.  Will continue Lunesta 3 mg at bedtime for insomnia.  Continue Lorazepam 0.5 mg po BID for anxiety and TMJ.  Pt reports that she  would like to follow-up on 01/25/23 as planned and will schedule earlier apt and/or contact office sooner if needed.    Sabrina Duke was seen today for depression.  Diagnoses and all orders for this visit:  Moderate episode of recurrent major depressive disorder (HCC) -     buPROPion ER (WELLBUTRIN SR) 150 MG 12 hr tablet; TAKE 1 TABLET BY MOUTH EVERY AFTERNOON -     buPROPion (WELLBUTRIN XL) 150 MG 24 hr tablet; Take 1 tablet (150 mg total) by mouth daily.  Anxiety  Primary insomnia     Please see After Visit Summary for patient specific instructions.  Future Appointments  Date Time Provider Department Center  01/25/2023  2:00 PM Sabrina Duke, PMHNP [facility] None  02/28/2023  2:00 PM Lomax, Amy, NP [facility] None    No orders of the defined types were placed in this encounter.     -------------------------------

## 2022-11-16 ENCOUNTER — Telehealth: Payer: Self-pay | Admitting: Neurology

## 2022-11-16 DIAGNOSIS — G43709 Chronic migraine without aura, not intractable, without status migrainosus: Secondary | ICD-10-CM

## 2022-11-16 MED ORDER — SUMATRIPTAN SUCCINATE 100 MG PO TABS
100.0000 mg | ORAL_TABLET | Freq: Once | ORAL | 2 refills | Status: DC | PRN
Start: 2022-11-16 — End: 2023-02-28

## 2022-11-16 NOTE — Telephone Encounter (Signed)
Pt called needing a refill request for her  SUMAtriptan (IMITREX) 100 MG tablet sent to the [facility] before the weekend.

## 2022-11-16 NOTE — Telephone Encounter (Signed)
Patient has a upcoming appt and refill was sent to her pharmacy.

## 2022-11-22 NOTE — Telephone Encounter (Signed)
Patient left a voicemail on my phone stating she had some questions about the SS that Dr. Vickey Huger has ordered for you.  I called her back but she did not pick up I left a voicemail with my direct number.

## 2022-11-27 NOTE — Telephone Encounter (Signed)
Patient called back and stated she would like to proceed on having a SS now, she stated that she would like to do the HST.  [employer] medicare pending faxed notes for the PA for the HST.

## 2022-11-29 NOTE — Telephone Encounter (Signed)
HST- [employer] medic[employer] no auth req via fax form. Patient is requesting the HST

## 2022-12-06 ENCOUNTER — Telehealth: Payer: Self-pay | Admitting: Psychiatry

## 2022-12-06 NOTE — Telephone Encounter (Signed)
Authorized early RF for 7/6.

## 2022-12-06 NOTE — Telephone Encounter (Signed)
Yes, please authorize early refill.

## 2022-12-06 NOTE — Telephone Encounter (Signed)
Pt called requesting early refill on Lunesta. Need to pick up 7/6. Pharmacy closed on Sunday. Out of town 7/7-7/14. [facility]. Apt 8/22

## 2022-12-06 NOTE — Telephone Encounter (Signed)
Last filled 6/11, due 7/9

## 2022-12-06 NOTE — Telephone Encounter (Signed)
Patient is asking for early RF on Lunesta. She is going out of town for 2 weeks on 7/7, which is a Sunday, and the pharmacy is not open. Wants to be pick up 7/6.  RF due 7/9. Is it ok to approve an early RF?

## 2022-12-18 NOTE — Telephone Encounter (Signed)
Patient cancelled HST appt that was scheduled for Wednesday 12/20/22.

## 2023-01-01 ENCOUNTER — Telehealth: Payer: Self-pay | Admitting: Psychiatry

## 2023-01-01 DIAGNOSIS — F331 Major depressive disorder, recurrent, moderate: Secondary | ICD-10-CM

## 2023-01-01 NOTE — Telephone Encounter (Signed)
Yes, please authorize early refill due to pt going on vacation.

## 2023-01-01 NOTE — Telephone Encounter (Addendum)
Patient is leaving for vacation 8/3-8/10, not the previously stated dates. Wants to pick up medication on 8/2. Has Rx for Lunesta, needs new Rx for Wellbutrin.

## 2023-01-01 NOTE — Telephone Encounter (Signed)
LV Lunesta 7/6, due 8/3.

## 2023-01-01 NOTE — Telephone Encounter (Signed)
Sabrina Duke called at 11:30 to request early refills of her Alfonso Patten and Wellbutrin SR 150mg .  She is going on vacation 7/27 and will be need to pick up her medication on 7/26 to have them during vacation.  Next appt 8/22.  Send refills with approval for early refill to  [facility] - [place], [place] - [address]

## 2023-01-02 MED ORDER — BUPROPION HCL ER (SR) 150 MG PO TB12
ORAL_TABLET | ORAL | 1 refills | Status: DC
Start: 2023-01-02 — End: 2023-01-25

## 2023-01-02 NOTE — Telephone Encounter (Signed)
Authorized early [place] for 8/2. Pharmacist said she had asked for early RF for July fill also. Rx for Wellbutrin sent.

## 2023-01-09 ENCOUNTER — Telehealth: Payer: Self-pay | Admitting: Neurology

## 2023-01-09 ENCOUNTER — Other Ambulatory Visit: Payer: Self-pay | Admitting: Anesthesiology

## 2023-01-09 MED ORDER — NARATRIPTAN HCL 2.5 MG PO TABS: 2.5000 mg | ORAL_TABLET | ORAL | 0 refills | Status: DC | PRN

## 2023-01-09 NOTE — Telephone Encounter (Signed)
Pt request refill for naratriptan (AMERGE) 2.5 MG tablet sent to a one time pharmacy due to being out of town [facility] [address] [place], [place] [address] Phone: [phone]

## 2023-01-22 ENCOUNTER — Encounter: Payer: Self-pay | Admitting: Family Medicine

## 2023-01-25 ENCOUNTER — Telehealth (INDEPENDENT_AMBULATORY_CARE_PROVIDER_SITE_OTHER): Payer: Medicare Other | Admitting: Psychiatry

## 2023-01-25 ENCOUNTER — Encounter: Payer: Self-pay | Admitting: Psychiatry

## 2023-01-25 DIAGNOSIS — F331 Major depressive disorder, recurrent, moderate: Secondary | ICD-10-CM | POA: Diagnosis not present

## 2023-01-25 DIAGNOSIS — F419 Anxiety disorder, unspecified: Secondary | ICD-10-CM

## 2023-01-25 DIAGNOSIS — F5101 Primary insomnia: Secondary | ICD-10-CM

## 2023-01-25 MED ORDER — LORAZEPAM 0.5 MG PO TABS
ORAL_TABLET | ORAL | 5 refills | Status: DC
Start: 2023-01-27 — End: 2023-07-17

## 2023-01-25 MED ORDER — BUPROPION HCL ER (XL) 300 MG PO TB24
300.0000 mg | ORAL_TABLET | Freq: Every day | ORAL | 2 refills | Status: DC
Start: 2023-01-25 — End: 2023-03-15

## 2023-01-25 MED ORDER — ESZOPICLONE 3 MG PO TABS
ORAL_TABLET | ORAL | 5 refills | Status: AC
Start: 2023-02-02 — End: ?

## 2023-01-25 NOTE — Progress Notes (Signed)
Sabrina Duke [id number] 1955/12/09 67 y.o.  Virtual Visit via Video Note  I connected with pt @ on 01/25/23 at  2:00 PM EDT by a video enabled telemedicine application and verified that I am speaking with the correct person using two identifiers.   I discussed the limitations of evaluation and management by telemedicine and the availability of in person appointments. The patient expressed understanding and agreed to proceed.  I discussed the assessment and treatment plan with the patient. The patient was provided an opportunity to ask questions and all were answered. The patient agreed with the plan and demonstrated an understanding of the instructions.   The patient was advised to call back or seek an in-person evaluation if the symptoms worsen or if the condition fails to improve as anticipated.  I provided 32 minutes of non-face-to-face time during this encounter.  The patient was located at home.  The provider was located at [facility].   Sabrina Duke, PMHNP   Subjective:   Patient ID:  Sabrina Duke is a 67 y.o. (DOB 04-02-1956) female.  Chief Complaint:  Chief Complaint  Patient presents with   Depression   Anxiety    HPI Sabrina Duke presents for follow-up of depression, anxiety, and insomnia. She reports, "I'm not doing too good." She reports that her thyroid function has been off. Medical provider has been adjusting Liothyronine and it was increased again last week after she told medical provider that she continues to have "brain fog," difficulty with her memory, and fatigue. She reports that she has noticed some slight improvement in energy after increase in Liothyronine. She reports, "I have been depressed lately." She reports, "things get on my nerves that usually would not get on my nerves." She reports irritability. She reports that she is sleeping ok. She reports that she had to cancel a sleep study because she "felt so bad." Limited enjoyment in things. She  reports occasional passive death wishes. Denies SI.   She reports that her mother's roommate died recently and mother has dementia and is frequently asking about her roommate. Mother has been in SNF for 6 years. Her niece has been helping some with care giving for her.   She reports that her husband retired and does not want to go anywhere.  She reports, "I feel good in the mornings and then around 3 pm I just crash." Takes Wellbutrin XL around 11 am and  SR around 3 pm.  Lorazepam last filled 12/30/22 x  4. Lunesta last filled 01/05/23.  Past medication trials: Wellbutrin- Effective, well tolerated Trintellix-GI side effects Pristiq- somewhat helpful Lexapro Celexa Prozac- Helped, caused headaches Cymbalta Sertraline Rexulti Vraylar-jittery, hallucinations Latuda-joint aches Abilify-weight gain Lamictal BuSpar- Weight gain Topamax Trazodone-excessive somnolence Lunesta Ambien-ineffective Adderall XR Ativan-Effective Gabapentin- excessive somnolence  Review of Systems:  Review of Systems  Constitutional:  Positive for fatigue.  Gastrointestinal:        She reports that she has been avoiding gluten  Musculoskeletal:  Negative for gait problem.  Psychiatric/Behavioral:         Please refer to HPI    Medications: I have reviewed the patient's current medications.  Current Outpatient Medications  Medication Sig Dispense Refill   Astaxanthin 4 MG CAPS Take by mouth.     B-12 METHYLCOBALAMIN PO Take by mouth.     buPROPion (WELLBUTRIN XL) 300 MG 24 hr tablet Take 1 tablet (300 mg total) by mouth daily. 30 tablet 2   [START ON 02/02/2023] Eszopiclone 3  MG TABS TAKE 1 TABLET BY MOUTH IMMEDIATELY BEFORE BEDTIME 30 tablet 5   levothyroxine (SYNTHROID) 50 MCG tablet Take 50 mcg by mouth daily before breakfast.     liothyronine (CYTOMEL) 5 MCG tablet Take 10 mcg by mouth daily.     LORazepam (ATIVAN) 0.5 MG tablet TAKE 1 TABLET BY MOUTH EVERY 8 HOURS 60 tablet 5   Magnesium 200  MG TABS Take 200 mg by mouth daily.      naratriptan (AMERGE) 2.5 MG tablet Take 1 tablet (2.5 mg total) by mouth as needed for migraine. Take one (1) tablet at onset of headache; if returns or does not resolve, may repeat after 4 hours; do not exceed five (5) mg in 24 hours. 10 tablet 0   promethazine (PHENERGAN) 25 MG tablet Take 1 tablet (25 mg total) by mouth every 6 (six) hours as needed for nausea or vomiting. 12 tablet 0   promethazine (PHENERGAN) 25 MG tablet Take by mouth.     SUMAtriptan (IMITREX) 100 MG tablet Take 1 tablet (100 mg total) by mouth once as needed for up to 1 dose for migraine. May repeat once in 2 hours if headache persists or recurs. Must make an Appointment for Future Refill Request 12 tablet 2   topiramate (TOPAMAX) 50 MG tablet Take 3 tablets (150 mg total) by mouth at bedtime. 270 tablet 1   Vitamin D, Ergocalciferol, (DRISDOL) 1.25 MG (50000 UNIT) CAPS capsule Take 50,000 Units by mouth once a week.     No current facility-administered medications for this visit.    Medication Side Effects: None  Allergies:  Allergies  Allergen Reactions   Bentyl [Dicyclomine] Other (See Comments)    Patient states it caused leg and arm spasms.   Ivp Dye [Iodinated Contrast Media] Anaphylaxis   Antihistamines, Loratadine-Type Other (See Comments)    Sleepy   Ciprofloxacin Nausea And Vomiting   Dymista [Azelastine-Fluticasone] Other (See Comments)    Heachache   Gluten Meal    Lortab [Hydrocodone-Acetaminophen] Nausea And Vomiting   Macrobid [Nitrofurantoin] Other (See Comments)    Unknown   Montelukast Other (See Comments)    Sleep   Nsaids     Pt can tolerate small amounts of Aleve "mess my stomach up, have had ulcer in the past"   Nurtec [Rimegepant Sulfate] Nausea Only   Other     Patient not sure about these allergies  Darvocet-   Mermantin- "makes me sick on my stomach"  Bio Citrate Calcium with Vitamin D- "hurt stomach"      Pepcid [Famotidine] Other  (See Comments)    Headache   Prednisone     "makes me hyper and gives me a headache"   Prilosec [Omeprazole] Other (See Comments)    Headache   Rosuvastatin Calcium Other (See Comments)    Headaches.   Vantin [Cefpodoxime] Nausea And Vomiting   Zofran [Ondansetron] Other (See Comments)    Causes headaches per pt    Past Medical History:  Diagnosis Date   Allergic rhinitis    Chronic kidney disease    Decreased bone density    Depression    GERD (gastroesophageal reflux disease)    Hypothyroid    Iron deficiency anemia    Low vitamin D level    Migraine    Osteoporosis     Family History  Problem Relation Age of Onset   Anxiety disorder Mother    Depression Mother    Bone cancer Mother    OCD Mother  Cerebral aneurysm Maternal Uncle    Drug abuse Brother    Depression Brother     Social History   Socioeconomic History   Marital status: Married    Spouse name: Not on file   Number of children: 1   Years of education: Not on file   Highest education level: High school graduate  Occupational History   Occupation: disabled  Tobacco Use   Smoking status: Former   Smokeless tobacco: Never  Advertising account planner   Vaping status: Never Used  Substance and Sexual Activity   Alcohol use: No   Drug use: No   Sexual activity: Not on file  Other Topics Concern   Not on file  Social History Narrative   Lives at home with husband   Right handed   No caffeine   Social Determinants of Corporate investment banker Strain: Not on file  Food Insecurity: Not on file  Transportation Needs: Not on file  Physical Activity: Not on file  Stress: Not on file  Social Connections: Not on file  Intimate Partner Violence: Not on file    Past Medical History, Surgical history, Social history, and Family history were reviewed and updated as appropriate.   Please see review of systems for further details on the patient's review from today.   Objective:   Physical Exam:  There  were no vitals taken for this visit.  Physical Exam Neurological:     Mental Status: She is alert and oriented to person, place, and time.     Cranial Nerves: No dysarthria.  Psychiatric:        Attention and Perception: Attention and perception normal.        Mood and Affect: Mood is depressed.        Speech: Speech normal.        Behavior: Behavior is cooperative.        Thought Content: Thought content normal. Thought content is not paranoid or delusional. Thought content does not include homicidal or suicidal ideation. Thought content does not include homicidal or suicidal plan.        Cognition and Memory: Cognition and memory normal.        Judgment: Judgment normal.     Comments: Insight intact Mildly anxious     Lab Review:     Component Value Date/Time   NA 139 03/08/2020 2241   K 4.0 03/08/2020 2241   CL 109 03/08/2020 2241   CO2 22 03/08/2020 2241   GLUCOSE 97 03/08/2020 2241   BUN 17 03/08/2020 2241   CREATININE 0.96 03/08/2020 2241   CALCIUM 9.3 03/08/2020 2241   PROT 6.7 03/08/2020 2241   ALBUMIN 4.3 03/08/2020 2241   AST 20 03/08/2020 2241   ALT 14 03/08/2020 2241   ALKPHOS 78 03/08/2020 2241   BILITOT 0.5 03/08/2020 2241   GFRNONAA >60 03/08/2020 2241   GFRAA >60 03/08/2020 2241       Component Value Date/Time   WBC 3.8 (L) 03/08/2020 2241   RBC 4.92 03/08/2020 2241   HGB 14.6 03/08/2020 2241   HCT 43.5 03/08/2020 2241   PLT 141 (L) 03/08/2020 2241   MCV 88.4 03/08/2020 2241   MCH 29.7 03/08/2020 2241   MCHC 33.6 03/08/2020 2241   RDW 12.5 03/08/2020 2241   LYMPHSABS 0.9 01/20/2020 0354   MONOABS 0.3 01/20/2020 0354   EOSABS 0.1 01/20/2020 0354   BASOSABS 0.0 01/20/2020 0354    No results found for: "POCLITH", "LITHIUM"   No results  found for: "PHENYTOIN", "PHENOBARB", "VALPROATE", "CBMZ"   .res Assessment: Plan:    35 minutes spent dedicated to the care of this patient on the date of this encounter to include pre-visit review of  records, ordering of medication, post visit documentation, and face-to-face time with the patient discussing treatment options for depression and her response to increase  dose of Wellbutrin SR. She reports that she notices improved mood in the morning after taking Wellbutrin XL and then mood and energy decline by early afternoon and do not improve after taking Wellbutrin SR. Pt reports that she would like to re-trial Wellbutrin XL 300 mg daily since she notices possibly more benefit from XL. Will increase Wellbutrin XL to 300 mg daily and discontinue Wellbutrin SR. Continue Lunesta 3 mg at bedtime for insomnia.  Continue Lorazepam 0.5 mg every 8 hours as needed for anxiety. Pt to follow-up in 3 months or sooner if clinically indicated.  Patient advised to contact office with any questions, adverse effects, or acute worsening in signs and symptoms.   Amandamarie was seen today for depression and anxiety.  Diagnoses and all orders for this visit:  Moderate episode of recurrent major depressive disorder (HCC) -     buPROPion (WELLBUTRIN XL) 300 MG 24 hr tablet; Take 1 tablet (300 mg total) by mouth daily.  Primary insomnia -     Eszopiclone 3 MG TABS; TAKE 1 TABLET BY MOUTH IMMEDIATELY BEFORE BEDTIME  Anxiety -     LORazepam (ATIVAN) 0.5 MG tablet; TAKE 1 TABLET BY MOUTH EVERY 8 HOURS     Please see After Visit Summary for patient specific instructions.  Future Appointments  Date Time Provider Department Center  02/28/2023  2:00 PM Lomax, Amy, NP [facility] None    No orders of the defined types were placed in this encounter.     -------------------------------

## 2023-02-06 ENCOUNTER — Other Ambulatory Visit: Payer: Self-pay | Admitting: Neurology

## 2023-02-22 ENCOUNTER — Other Ambulatory Visit: Payer: Self-pay | Admitting: Neurology

## 2023-02-22 DIAGNOSIS — G43709 Chronic migraine without aura, not intractable, without status migrainosus: Secondary | ICD-10-CM

## 2023-02-23 ENCOUNTER — Other Ambulatory Visit: Payer: Self-pay | Admitting: Neurology

## 2023-02-23 DIAGNOSIS — G43709 Chronic migraine without aura, not intractable, without status migrainosus: Secondary | ICD-10-CM

## 2023-02-23 NOTE — Telephone Encounter (Signed)
Pt said pharmacy requested confirmation from the office pt has scheduled an appt. Pt has an appt scheduled on 02/28/23 at 2pm.

## 2023-02-23 NOTE — Telephone Encounter (Signed)
Pt called stating that the pharmacy still can not refill the medication unless the on call sends in the Rx. Please advise.

## 2023-02-27 ENCOUNTER — Telehealth: Payer: Self-pay | Admitting: Neurology

## 2023-02-27 MED ORDER — TOPIRAMATE 50 MG PO TABS: 150.0000 mg | ORAL_TABLET | Freq: Every day | ORAL | 1 refills | Status: DC

## 2023-02-27 NOTE — Patient Instructions (Signed)
Below is our plan:  We will continue topiramate and sumatriptan. Can use naratriptan for intractable headaches.   Please make sure you are staying well hydrated. I recommend 50-60 ounces daily. Well balanced diet and regular exercise encouraged. Consistent sleep schedule with 6-8 hours recommended.   Please continue follow up with care team as directed.   Follow up with me in 1 year   You may receive a survey regarding today's visit. I encourage you to leave honest feed back as I do use this information to improve patient care. Thank you for seeing me today!   GENERAL HEADACHE INFORMATION:   Natural supplements: Magnesium Oxide or Magnesium Glycinate 500 mg at bed (up to 800 mg daily) Coenzyme Q10 300 mg in AM Vitamin B2- 200 mg twice a day   Add 1 supplement at a time since even natural supplements can have undesirable side effects. You can sometimes buy supplements cheaper (especially Coenzyme Q10) at www.WebmailGuide.co.za or at [facility].  Migraine with aura: There is increased risk for stroke in women with migraine with aura and a contraindication for the combined contraceptive pill for use by women who have migraine with aura. The risk for women with migraine without aura is lower. However other risk factors like smoking are far more likely to increase stroke risk than migraine. There is a recommendation for no smoking and for the use of OCPs without estrogen such as progestogen only pills particularly for women with migraine with aura.Marland Kitchen People who have migraine headaches with auras may be 3 times more likely to have a stroke caused by a blood clot, compared to migraine patients who don't see auras. Women who take hormone-replacement therapy may be 30 percent more likely to suffer a clot-based stroke than women not taking medication containing estrogen. Other risk factors like smoking and high blood pressure may be  much more important.    Vitamins and herbs that show potential:   Magnesium:  Magnesium (250 mg twice a day or 500 mg at bed) has a relaxant effect on smooth muscles such as blood vessels. Individuals suffering from frequent or daily headache usually have low magnesium levels which can be increase with daily supplementation of 400-750 mg. Three trials found 40-90% average headache reduction  when used as a preventative. Magnesium may help with headaches are aura, the best evidence for magnesium is for migraine with aura is its thought to stop the cortical spreading depression we believe is the pathophysiology of migraine aura.Magnesium also demonstrated the benefit in menstrually related migraine.  Magnesium is part of the messenger system in the serotonin cascade and it is a good muscle relaxant.  It is also useful for constipation which can be a side effect of other medications used to treat migraine. Good sources include nuts, whole grains, and tomatoes. Side Effects: loose stool/diarrhea  Riboflavin (vitamin B 2) 200 mg twice a day. This vitamin assists nerve cells in the production of ATP a principal energy storing molecule.  It is necessary for many chemical reactions in the body.  There have been at least 3 clinical trials of riboflavin using 400 mg per day all of which suggested that migraine frequency can be decreased.  All 3 trials showed significant improvement in over half of migraine sufferers.  The supplement is found in bread, cereal, milk, meat, and poultry.  Most Americans get more riboflavin than the recommended daily allowance, however riboflavin deficiency is not necessary for the supplements to help prevent headache. Side effects: energizing, green urine  Coenzyme Q10: This is present in almost all cells in the body and is critical component for the conversion of energy.  Recent studies have shown that a nutritional supplement of CoQ10 can reduce the frequency of migraine attacks by improving the energy production of cells as with riboflavin.  Doses of 150 mg twice a  day have been shown to be effective.   Melatonin: Increasing evidence shows correlation between melatonin secretion and headache conditions.  Melatonin supplementation has decreased headache intensity and duration.  It is widely used as a sleep aid.  Sleep is natures way of dealing with migraine.  A dose of 3 mg is recommended to start for headaches including cluster headache. Higher doses up to 15 mg has been reviewed for use in Cluster headache and have been used. The rationale behind using melatonin for cluster is that many theories regarding the cause of Cluster headache center around the disruption of the normal circadian rhythm in the brain.  This helps restore the normal circadian rhythm.   HEADACHE DIET: Foods and beverages which may trigger migraine Note that only 20% of headache patients are food sensitive. You will know if you are food sensitive if you get a headache consistently 20 minutes to 2 hours after eating a certain food. Only cut out a food if it causes headaches, otherwise you might remove foods you enjoy! What matters most for diet is to eat a well balanced healthy diet full of vegetables and low fat protein, and to not miss meals.   Chocolate, other sweets ALL cheeses except cottage and cream cheese Dairy products, yogurt, sour cream, ice cream Liver Meat extracts (Bovril, Marmite, meat tenderizers) Meats or fish which have undergone aging, fermenting, pickling or smoking. These include: Hotdogs,salami,Lox,sausage, mortadellas,smoked salmon, pepperoni, Pickled herring Pods of broad bean (English beans, Chinese pea pods, [place] (fava) beans, lima and navy beans Ripe avocado, ripe banana Yeast extracts or active yeast preparations such as Brewer's or Fleishman's (commercial bakes goods are permitted) Tomato based foods, pizza (lasagna, etc.)   MSG (monosodium glutamate) is disguised as many things; look for these common aliases: Monopotassium glutamate Autolysed  yeast Hydrolysed protein Sodium caseinate "flavorings" "all natural preservatives" Nutrasweet   Avoid all other foods that convincingly provoke headaches.   Resources: The Dizzy Apphia, Plagman Your Headache Diet, migrainestrong.com  https://zamora-andrews.com/   Caffeine and Migraine For patients that have migraine, caffeine intake more than 3 days per week can lead to dependency and increased migraine frequency. I would recommend cutting back on your caffeine intake as best you can. The recommended amount of caffeine is 200-300 mg daily, although migraine patients may experience dependency at even lower doses. While you may notice an increase in headache temporarily, cutting back will be helpful for headaches in the long run. For more information on caffeine and migraine, visit: https://americanmigrainefoundation.org/resource-library/caffeine-and-migraine/   Headache Prevention Strategies:   1. Maintain a headache diary; learn to identify and avoid triggers.  - This can be a simple note where you log when you had a headache, associated symptoms, and medications used - There are several smartphone apps developed to help track migraines: Migraine Buddy, Migraine Monitor, Curelator N1-Headache App   Common triggers include: Emotional triggers: Emotional/Upset family or friends Emotional/Upset occupation Business reversal/success Anticipation anxiety Crisis-serious Post-crisis periodNew job/position   Physical triggers: Vacation Day Weekend Strenuous Exercise High Altitude Location New Move Menstrual Day Physical Illness Oversleep/Not enough sleep Weather changes Light: Photophobia or light sesnitivity treatment involves a balance between desensitization and reduction  in overly strong input. Use dark polarized glasses outside, but not inside. Avoid bright or fluorescent light, but do not dim environment to the point that going into a  normally lit room hurts. Consider FL-41 tint lenses, which reduce the most irritating wavelengths without blocking too much light.  These can be obtained at axonoptics.com or theraspecs.com Foods: see list above.   2. Limit use of acute treatments (over-the-counter medications, triptans, etc.) to no more than 2 days per week or 10 days per month to prevent medication overuse headache (rebound headache).     3. Follow a regular schedule (including weekends and holidays): Don't skip meals. Eat a balanced diet. 8 hours of sleep nightly. Minimize stress. Exercise 30 minutes per day. Being overweight is associated with a 5 times increased risk of chronic migraine. Keep well hydrated and drink 6-8 glasses of water per day.   4. Initiate non-pharmacologic measures at the earliest onset of your headache. Rest and quiet environment. Relax and reduce stress. Breathe2Relax is a free app that can instruct you on    some simple relaxtion and breathing techniques. Http://Dawnbuse.com is a    free website that provides teaching videos on relaxation.  Also, there are  many apps that   can be downloaded for "mindful" relaxation.  An app called YOGA NIDRA will help walk you through mindfulness. Another app called Calm can be downloaded to give you a structured mindfulness guide with daily reminders and skill development. Headspace for guided meditation Mindfulness Based Stress Reduction Online Course: www.palousemindfulness.com Cold compresses.   5. Don't wait!! Take the maximum allowable dosage of prescribed medication at the first sign of migraine.   6. Compliance:  Take prescribed medication regularly as directed and at the first sign of a migraine.   7. Communicate:  Call your physician when problems arise, especially if your headaches change, increase in frequency/severity, or become associated with neurological symptoms (weakness, numbness, slurred speech, etc.). Proceed to emergency room if you experience  new or worsening symptoms or symptoms do not resolve, if you have new neurologic symptoms or if headache is severe, or for any concerning symptom.   8. Headache/pain management therapies: Consider various complementary methods, including medication, behavioral therapy, psychological counselling, biofeedback, massage therapy, acupuncture, dry needling, and other modalities.  Such measures may reduce the need for medications. Counseling for pain management, where patients learn to function and ignore/minimize their pain, seems to work very well.   9. Recommend changing family's attention and focus away from patient's headaches. Instead, emphasize daily activities. If first question of day is 'How are your headaches/Do you have a headache today?', then patient will constantly think about headaches, thus making them worse. Goal is to re-direct attention away from headaches, toward daily activities and other distractions.   10. Helpful Websites: www.AmericanHeadacheSociety.org PatentHood.ch www.headaches.org TightMarket.nl www.achenet.org

## 2023-02-27 NOTE — Telephone Encounter (Signed)
Pt request refill for topiramate (TOPAMAX) 50 MG tablet sent to [facility]

## 2023-02-27 NOTE — Progress Notes (Unsigned)
PATIENT: Sabrina Duke DOB: 1955-07-12  REASON FOR VISIT: follow up HISTORY FROM: patient  No chief complaint on file.    HISTORY OF PRESENT ILLNESS:  02/27/23 ALL: Sabrina Duke returns for follow up for migraines. She was last seen by me 12/2021. We continued topiramate 150mg  at bedtime and sumatriptan and or naratriptan as needed.   She was seen by Dr D 04/2022. Sleep study ordered but she has not scheduled.   12/15/2021 ALL: Sabrina Duke returns for worsening migraines. She was last seen 08/2021 and having about 8 migraine days a month. We continues topiramate 150mg  daily and sumatriptan 100mg . She reports that about 1.5 months ago she started having more headaches. Headaches are different than regular migraines. She describes bilateral maxillary and frontal pain. It is a nagging headache that starts around 11am. Usually worsens during the day. Sometimes she wakes up with headache. She reports being very nauseated, sensitive to light and sound. She stays hoarse. Sumatriptan does knock out headache. She is going through a full rx every month. Amerge usually helps break cycle. She had MRI in [place] on 11/29/2021 that was normal. She has chronic sinus symptoms. She reports antihistamines cause leg cramps. No obvious concerns of TMJ. We discussed possible sleep referral at last visit due to snoring, some morning headaches and dry mouth. She had a brother with sleep apnea. ESS 8.  Tried and failed: Topiramate (on now), Inderal, gabapentin, nortriptyline, Depakote, Maxalt, Emerge, Botox, Amovig (constipation), sumatriptan (on now), Nurtec (nausea)   08/22/2021 ALL: Sabrina Duke returns for follow up for migraines. She continues topiramate 150mg  daily at bedtime and sumatriptan 100mg  as needed. She feels that migraines are about the same. She usually has about 8 migraine days per month. Migraines worsen during season changes. Sumatriptan usually works well. Occasionally she will take a round of Amerge that will break a  bad cycle. She has recently started Ativan for anxiety and feels this has helped migraines. She is followed closely by psychiatry. She does note that she snores. She will wake herself at times snoring or coughing. Most migraines come on through the day but 1-2 per month are present in the morning when she wakes. She does wake with dry mouth. She denies nocturia. Some daytime sleepiness but does not normally fall asleep. ESS 8.   06/15/20 ALL (Mychart): Sabrina Duke is a 67 y.o. female here today for follow up for migraines. She was last seen by [facility] in 09/2019. She continues topiramate 150mg  at bedtime and sumatriptan for abortive therapy. She was unable to tolerate Amovig due to constipation. Emgality or Ajovy was not started do to fears of side effects.    She reports that headaches have improved over the past 3-4 months. She was diagnosed with Celiac disease and has made significant diet changes. She does not eat gluten. She has about 8 headache days per month. Usually easily aborted with sumatriptan. She is no longer needing a full prescription of sumatriptan. She has had difficulty with constipation and is followed closely by GI.    Tried and failed: Topiramate (on now), Inderal, gabapentin, nortriptyline, Depakote, Maxalt, Emerge, Botox, Amovig (constipation), sumatriptan (on now), Nurtec (nausea)   09/09/19 MM: Sabrina Duke is a 67 year old female with a history of migraine headaches. She returns today for follow-up.  She reports that she never tried Ajovy. Reports in the last month she has had 15 headaches.  She states prior to she was having 7-8 headaches a month.  She states that sumatriptan normally  resolves her headache in about 30 minutes.  Her headaches always occur on the left side.  She does have photophobia, phonophobia, nausea and vomiting.  She has Phenergan for nausea. The patient reports that she never tried Ajovy because she was worried about the potential side effects.  She has  continued on Topamax 150 mg daily.   Tried and failed: Inderal, gabapentin, nortriptyline, Depakote, Maxalt, Emerge, Botox  02/06/2019 ALL:  Sabrina Duke is a 67 y.o. female here today for follow up for migraines. She continues to have regular headaches with migrainous features of pounding, nausea, light and sound sensitivity. She has been seen 4 times this month for acute management. She was started on Amovig at last visit in 11/2018. She was not able to tolerate this due to increased constipation. She did not feel that it helped headache. She continues topiramate 150mg  at bedtime. She has been unable to tolerate Inderal, gabapentin, nortriptyline, Depakote, and Imitrex in the past. She has used Maxalt and Emerge for abortive therapy but is not sure they help. She also uses phenergan and compazine for nausea as needed.   11/12/2018 ALL: Sabrina Duke is a 67 y.o. female here today for follow up for migraines.  She continues to have regular migraines.  She states that she is going through a full prescription of Maxalt every month.  She does feel that Maxalt tends to help abort migraine.  She does continue topiramate 150 mg at bedtime without adverse effects.  She is uncertain if this is helping.  She reports that migraines are typical to those in the past.  She has no new symptoms.  She has tried multiple medications in the past but has never taken Aimovig.   02/26/2018 MM: Sabrina Duke is a 67 year old female with a history of migraine headaches.  She returns today for follow-up.  He states that her headaches have remained relatively the same.  She states that she has 10 severe headaches a month but has at least 15 headache days a month.  Her headaches always occur on the left side.  She does have photophobia and phonophobia as well as nausea and vomiting.  She takes Maxalt but it does not always resolve the headache.  She typically has to take Phenergan and occasionally Compazine to make her go to sleep for  headache to resolve.  In the past she has tried multiple medications including  Inderal, Depakote, gabapentin, nortriptyline and Botox without benefit.  She returns today for evaluation.   HISTORY Sabrina Duke is a 67 y.o. female here as a referral from Dr. Clarisse Gouge for migraines.  Past medical history of migraines, chronic low back pain, depression.  She has migraines for decades. She uses 12 maxalt a month, does not use Tylenol or ibuprofen or other medications. She takes Demerol for severe headaches. In a month she has 20 headache days a month on average and 12 are migrainous. Barometric pressure is a trigger. No known other triggers, maybe stress or sleeping. She endorses photophobia/phonophobia. Migraines can last > 24 hours. No medication overuse. No aura.  Headaches are unilateral, pulsating and pounding. Can be severe. No other focal neurologic deficits, associated symptoms, inciting events or modifiable factors.   Numbness in the leg started 2 months ago, hurts more with leg in a recliner, tingling from the knee to the toe. She has spasms in the back, radicular symptoms, bending over makes it worse with spasms. She has tried conservative measures for 2 months, heat, stretching,  exercise, PT, NSAIDs. No relief. Has weakness. It is worsening slowly. No inciting events or trauma.   Reviewed notes, labs and imaging from outside physicians, which showed:   2013 Myelogram: FINDINGS:  AGAIN NOTED PATIENT HAS 6 LUMBAR VERTEBRAL BODIES. THERE IS A MINIMAL VENTRAL DEFECT AT THE LEVEL OF L5/6 SEEN ON LATERAL FILM. THE NERVE ROOTS ARE WELL FILLED WITHOUT EVIDENCE OF COMPRESSION OR DISPLACEMENT. THE CONUS IS NORMAL IN APPEARANCE.  CONCLUSIONS:  1.  SIX LUMBAR VERTEBRAL BODIES. 2.  NO EVIDENCE OF NERVE ROOT COMPRESSION OR DISPLACEMENT. 3.  SMALL VENTRAL BULGE AT L5/6. APPROVING MD: Murtis Sink   Reviewed prior notes, patient was seen in the emergency room in November 2015 complaining of  intermittent severe migraine headaches.  Localized in her left temple and left cheek described as throbbing.  She reported nausea, vomiting, blurred vision, photophobia, phonophobia.  She tried Demerol and Compazine with no relief.  Felt as though there was fluid in her ears.  Headaches improved after IV fluids and medications and she was discharged home.    REVIEW OF SYSTEMS: Out of a complete 14 system review of symptoms, the patient complains only of the following symptoms, dizziness, headache, snoring, and all other reviewed systems are negative.     08/22/2021    2:03 PM  Results of the Epworth flowsheet  Sitting and reading 1  Watching TV 2  Sitting, inactive in a public place (e.g. a theatre or a meeting) 1  As a passenger in a car for an hour without a break 0  Lying down to rest in the afternoon when circumstances permit 2  Sitting and talking to someone 1  Sitting quietly after a lunch without alcohol 1  In a car, while stopped for a few minutes in traffic 0  Total score 8     ALLERGIES: Allergies  Allergen Reactions   Bentyl [Dicyclomine] Other (See Comments)    Patient states it caused leg and arm spasms.   Ivp Dye [Iodinated Contrast Media] Anaphylaxis   Antihistamines, Loratadine-Type Other (See Comments)    Sleepy   Ciprofloxacin Nausea And Vomiting   Dymista [Azelastine-Fluticasone] Other (See Comments)    Heachache   Gluten Meal    Lortab [Hydrocodone-Acetaminophen] Nausea And Vomiting   Macrobid [Nitrofurantoin] Other (See Comments)    Unknown   Montelukast Other (See Comments)    Sleep   Nsaids     Pt can tolerate small amounts of Aleve "mess my stomach up, have had ulcer in the past"   Nurtec [Rimegepant Sulfate] Nausea Only   Other     Patient not sure about these allergies  Darvocet-   Mermantin- "makes me sick on my stomach"  Bio Citrate Calcium with Vitamin D- "hurt stomach"      Pepcid [Famotidine] Other (See Comments)    Headache    Prednisone     "makes me hyper and gives me a headache"   Prilosec [Omeprazole] Other (See Comments)    Headache   Rosuvastatin Calcium Other (See Comments)    Headaches.   Vantin [Cefpodoxime] Nausea And Vomiting   Zofran [Ondansetron] Other (See Comments)    Causes headaches per pt    HOME MEDICATIONS: Outpatient Medications Prior to Visit  Medication Sig Dispense Refill   Astaxanthin 4 MG CAPS Take by mouth.     B-12 METHYLCOBALAMIN PO Take by mouth.     buPROPion (WELLBUTRIN XL) 300 MG 24 hr tablet Take 1 tablet (300 mg total) by mouth daily.  30 tablet 2   Eszopiclone 3 MG TABS TAKE 1 TABLET BY MOUTH IMMEDIATELY BEFORE BEDTIME 30 tablet 5   levothyroxine (SYNTHROID) 50 MCG tablet Take 50 mcg by mouth daily before breakfast.     liothyronine (CYTOMEL) 5 MCG tablet Take 10 mcg by mouth daily.     LORazepam (ATIVAN) 0.5 MG tablet TAKE 1 TABLET BY MOUTH EVERY 8 HOURS 60 tablet 5   Magnesium 200 MG TABS Take 200 mg by mouth daily.      naratriptan (AMERGE) 2.5 MG tablet TAKE 1 TABLET (2.5 MG TOTAL) BY MOUTH AS NEEDED FOR MIGRAINE. TAKE ONE (1) TABLET AT ONSET OF HEADACHE IF RETURNS OR DOES NOT RESOLVE, MAY REPEAT AFTER 4 HOURS DO NOT EXCEED FIVE (5) MG IN 24 HOURS. 10 tablet 0   promethazine (PHENERGAN) 25 MG tablet Take 1 tablet (25 mg total) by mouth every 6 (six) hours as needed for nausea or vomiting. 12 tablet 0   promethazine (PHENERGAN) 25 MG tablet Take by mouth.     SUMAtriptan (IMITREX) 100 MG tablet Take 1 tablet (100 mg total) by mouth once as needed for up to 1 dose for migraine. May repeat once in 2 hours if headache persists or recurs. Must make an Appointment for Future Refill Request 12 tablet 2   topiramate (TOPAMAX) 50 MG tablet Take 3 tablets (150 mg total) by mouth at bedtime. 270 tablet 1   Vitamin D, Ergocalciferol, (DRISDOL) 1.25 MG (50000 UNIT) CAPS capsule Take 50,000 Units by mouth once a week.     No facility-administered medications prior to visit.    PAST  MEDICAL HISTORY: Past Medical History:  Diagnosis Date   Allergic rhinitis    Chronic kidney disease    Decreased bone density    Depression    GERD (gastroesophageal reflux disease)    Hypothyroid    Iron deficiency anemia    Low vitamin D level    Migraine    Osteoporosis     PAST SURGICAL HISTORY: Past Surgical History:  Procedure Laterality Date   BIOPSY  03/10/2019   Procedure: BIOPSY;  Surgeon: Malissa Hippo, MD;  [facility]: [facility];  Service: Endoscopy;;  gastric    BIOPSY  03/24/2020   Procedure: BIOPSY;  Surgeon: Malissa Hippo, MD;  Location: [facility];  Service: Endoscopy;;  duodenum gastric   COLONOSCOPY WITH PROPOFOL N/A 03/24/2020   Procedure: COLONOSCOPY WITH PROPOFOL;  Surgeon: Malissa Hippo, MD;  Location: [facility];  Service: Endoscopy;  Laterality: N/A;  1225   ESOPHAGOGASTRODUODENOSCOPY N/A 03/10/2019   Procedure: ESOPHAGOGASTRODUODENOSCOPY (EGD);  Surgeon: Malissa Hippo, MD;  Location: [facility];  Service: Endoscopy;  Laterality: N/A;  2:40-rescheduled 10/5 @11 :55am per office   ESOPHAGOGASTRODUODENOSCOPY (EGD) WITH PROPOFOL N/A 03/24/2020   Procedure: ESOPHAGOGASTRODUODENOSCOPY (EGD) WITH PROPOFOL;  Surgeon: Malissa Hippo, MD;  Location: [facility];  Service: Endoscopy;  Laterality: N/A;   hemroidectomy  1990   OVARIAN CYST SURGERY  1972   SHOULDER SURGERY Left 11/2017   TUBAL LIGATION  1997   uterine polyp removal  05/2015    FAMILY HISTORY: Family History  Problem Relation Age of Onset   Anxiety disorder Mother    Depression Mother    Bone cancer Mother    OCD Mother    Cerebral aneurysm Maternal Uncle    Drug abuse Brother    Depression Brother     SOCIAL HISTORY: Social History   Socioeconomic History   Marital status: Married  Spouse name: Not on file   Number of children: 1   Years of education: Not on file   Highest education level: High school graduate  Occupational History   Occupation:  disabled  Tobacco Use   Smoking status: Former   Smokeless tobacco: Never  Vaping Use   Vaping status: Never Used  Substance and Sexual Activity   Alcohol use: No   Drug use: No   Sexual activity: Not on file  Other Topics Concern   Not on file  Social History Narrative   Lives at home with husband   Right handed   No caffeine   Social Determinants of Health   Financial Resource Strain: Not on file  Food Insecurity: Not on file  Transportation Needs: Not on file  Physical Activity: Not on file  Stress: Not on file  Social Connections: Not on file  Intimate Partner Violence: Not on file      PHYSICAL EXAM  There were no vitals filed for this visit.    There is no height or weight on file to calculate BMI.  Generalized: Well developed, in no acute distress  Cardiology: normal rate and rhythm, no murmur noted Neurological examination  Mentation: Alert oriented to time, place, history taking. Follows all commands speech and language fluent Cranial nerve II-XII: Pupils were equal round reactive to light. Extraocular movements were full, visual field were full on confrontational test. Facial sensation and strength were normal. Head turning and shoulder shrug  were normal and symmetric. Motor: The motor testing reveals 5 over 5 strength of all 4 extremities. Good symmetric motor tone is noted throughout.  Gait and station: Gait is normal.   DIAGNOSTIC DATA (LABS, IMAGING, TESTING) - I reviewed patient records, labs, notes, testing and imaging myself where available.      No data to display           Lab Results  Component Value Date   WBC 3.8 (L) 03/08/2020   HGB 14.6 03/08/2020   HCT 43.5 03/08/2020   MCV 88.4 03/08/2020   PLT 141 (L) 03/08/2020      Component Value Date/Time   NA 139 03/08/2020 2241   K 4.0 03/08/2020 2241   CL 109 03/08/2020 2241   CO2 22 03/08/2020 2241   GLUCOSE 97 03/08/2020 2241   BUN 17 03/08/2020 2241   CREATININE 0.96  03/08/2020 2241   CALCIUM 9.3 03/08/2020 2241   PROT 6.7 03/08/2020 2241   ALBUMIN 4.3 03/08/2020 2241   AST 20 03/08/2020 2241   ALT 14 03/08/2020 2241   ALKPHOS 78 03/08/2020 2241   BILITOT 0.5 03/08/2020 2241   GFRNONAA >60 03/08/2020 2241   GFRAA >60 03/08/2020 2241   No results found for: "CHOL", "HDL", "LDLCALC", "LDLDIRECT", "TRIG", "CHOLHDL" No results found for: "HGBA1C" No results found for: "VITAMINB12" Lab Results  Component Value Date   TSH 1.911 01/17/2020       ASSESSMENT AND PLAN 67 y.o. year old female  has a past medical history of Allergic rhinitis, Chronic kidney disease, Decreased bone density, Depression, GERD (gastroesophageal reflux disease), Hypothyroid, Iron deficiency anemia, Low vitamin D level, Migraine, and Osteoporosis. here with   No diagnosis found.   Ms. Saeger reports headaches have worsened over the past 1.5 months. She has a history of intractable migraines. She does have morning headaches, dry mouth and snoring. Family history of sleep apnea. We have tried and failed multiple migraine preventatives. I have recommended she proceed with a sleep evaluation. Referral  placed, today. She will continue topiramate 150mg  QHS and sumatriptan 100mg  as needed. She was advised against regular use of abortive medications. We have discussed option of trying Emgality versus Ajovy (Amovig caused constipation) or adding Qulipta daily for prevention if headaches worsen. She is hesitant stating that she is very sensitive to meds and has significant constipation. She was encouraged to continue healthy lifestyle habits. She will follow-up with me in pending sleep eval.  She verbalizes understanding and agreement with this plan.   No orders of the defined types were placed in this encounter.    No orders of the defined types were placed in this encounter.      Shawnie Dapper, FNP-C 02/27/2023, 2:36 PM [facility] [address], [address] [place],  [place] [address] [phone]

## 2023-02-27 NOTE — Telephone Encounter (Signed)
Rx refill was completed.

## 2023-02-28 ENCOUNTER — Ambulatory Visit: Payer: Medicare Other | Admitting: Family Medicine

## 2023-02-28 ENCOUNTER — Encounter: Payer: Self-pay | Admitting: Family Medicine

## 2023-02-28 DIAGNOSIS — G43709 Chronic migraine without aura, not intractable, without status migrainosus: Secondary | ICD-10-CM | POA: Diagnosis not present

## 2023-02-28 MED ORDER — TOPIRAMATE 50 MG PO TABS: 150.0000 mg | ORAL_TABLET | Freq: Every day | ORAL | 3 refills | Status: DC

## 2023-02-28 MED ORDER — SUMATRIPTAN SUCCINATE 100 MG PO TABS: 100.0000 mg | ORAL_TABLET | Freq: Once | ORAL | 11 refills | Status: DC | PRN

## 2023-03-07 ENCOUNTER — Telehealth (HOSPITAL_BASED_OUTPATIENT_CLINIC_OR_DEPARTMENT_OTHER): Payer: Self-pay | Admitting: Orthopaedic Surgery

## 2023-03-07 ENCOUNTER — Ambulatory Visit (INDEPENDENT_AMBULATORY_CARE_PROVIDER_SITE_OTHER): Payer: Medicare Other | Admitting: Orthopaedic Surgery

## 2023-03-07 ENCOUNTER — Telehealth: Payer: Self-pay | Admitting: Orthopaedic Surgery

## 2023-03-07 ENCOUNTER — Encounter: Payer: Self-pay | Admitting: Orthopaedic Surgery

## 2023-03-07 DIAGNOSIS — S73192D Other sprain of left hip, subsequent encounter: Secondary | ICD-10-CM

## 2023-03-07 NOTE — Progress Notes (Signed)
The patient is a very pleasant and active 67 year old female who comes to [place] for a second opinion as a relates to her left hip.  She has been dealing with chronic left hip pain for many years now.  This is been some in the groin but a lot over the lateral aspect of her left hip.  She has been seen by another orthopedic practice in town who is physiatrist is placed numerous injections around the trochanteric area of her left hip as well as intra-articular injections in that left hip.  She does walk with a slight limp.  Her pain has become daily with that left hip and it is detrimentally affecting her mobility, her quality of life and her actives daily living.  It is very frustrating for her.  She has been through countless rounds of physical therapy and taking anti-inflammatories as well.  This is just not coming down for her at this point and she sought [place] for another opinion.  She does have a MRI on the canopy system for me to evaluate as well.  On examination of her left hip does move smoothly and fluidly with no blocks to rotation at all.  She is a thin individual.  There is no muscle atrophy that I can see.  She does walk with a slight limp but not a true Trendelenburg gait.  There is pain to palpation along the tip of the trochanteric area on that left side.  Her right hip exam is entirely normal.  The imaging studies are reviewed.  She does have partial tearing of the superior labrum on that left hip and there is tendinosis involving the gluteus medius and minimus tendons.  The cartilage is entirely normal of the hip femoral head and acetabulum and there is no significant joint effusion at all.  I did give her reassurance that I do not think that a hip replacement is warranted based on what we are seeing on clinical exam and imaging findings.  However I do feel that she needs to be seen by a fellowship trained sports medicine orthopedic surgeon such as my partner Dr.Bokshan for his critical evaluation as  to whether or not she would benefit from arthroscopic intervention of her left hip or even surgery involving the trochanteric area of that hip.  We will work on making that referral for her and she would like to see him as well.

## 2023-03-07 NOTE — Telephone Encounter (Signed)
Per Dr. Magnus Ivan would like for pt to see Dr. Steward Drone instead. Can this pt be worked in anytime before the end of 2024.

## 2023-03-07 NOTE — Telephone Encounter (Signed)
A slot just previously opened 03/21/23 @1 :15pm. Pt is booked at that time.

## 2023-03-07 NOTE — Telephone Encounter (Signed)
Left a message for patient to call and get sch.Patient is a Banker

## 2023-03-13 ENCOUNTER — Ambulatory Visit (INDEPENDENT_AMBULATORY_CARE_PROVIDER_SITE_OTHER): Payer: Medicare Other | Admitting: Neurology

## 2023-03-13 DIAGNOSIS — G4733 Obstructive sleep apnea (adult) (pediatric): Secondary | ICD-10-CM | POA: Diagnosis not present

## 2023-03-13 DIAGNOSIS — R0683 Snoring: Secondary | ICD-10-CM

## 2023-03-13 DIAGNOSIS — F329 Major depressive disorder, single episode, unspecified: Secondary | ICD-10-CM

## 2023-03-13 DIAGNOSIS — R519 Headache, unspecified: Secondary | ICD-10-CM

## 2023-03-14 ENCOUNTER — Other Ambulatory Visit: Payer: Self-pay | Admitting: Psychiatry

## 2023-03-14 DIAGNOSIS — F331 Major depressive disorder, recurrent, moderate: Secondary | ICD-10-CM

## 2023-03-15 ENCOUNTER — Other Ambulatory Visit: Payer: Self-pay

## 2023-03-15 ENCOUNTER — Telehealth: Payer: Self-pay | Admitting: Psychiatry

## 2023-03-15 DIAGNOSIS — F331 Major depressive disorder, recurrent, moderate: Secondary | ICD-10-CM

## 2023-03-15 MED ORDER — BUPROPION HCL ER (XL) 300 MG PO TB24: 300.0000 mg | ORAL_TABLET | Freq: Every day | ORAL | 1 refills | Status: DC

## 2023-03-15 NOTE — Telephone Encounter (Signed)
Sabrina Duke called to check status of the refill of her Wellbutrin.  The pharmacy doesn't show refills. Appt 08/28/23/  Please approve the refill.

## 2023-03-15 NOTE — Telephone Encounter (Signed)
pended

## 2023-03-16 ENCOUNTER — Other Ambulatory Visit: Payer: Self-pay | Admitting: Psychiatry

## 2023-03-16 DIAGNOSIS — F331 Major depressive disorder, recurrent, moderate: Secondary | ICD-10-CM

## 2023-03-19 ENCOUNTER — Other Ambulatory Visit: Payer: Self-pay

## 2023-03-19 ENCOUNTER — Telehealth: Payer: Self-pay | Admitting: Psychiatry

## 2023-03-19 MED ORDER — BUPROPION HCL ER (SR) 150 MG PO TB12
150.0000 mg | ORAL_TABLET | Freq: Two times a day (BID) | ORAL | 0 refills | Status: DC
Start: 2023-03-19 — End: 2023-04-27

## 2023-03-19 NOTE — Telephone Encounter (Signed)
Pended.

## 2023-03-19 NOTE — Telephone Encounter (Signed)
Patient called in regarding her Bupropion prescription. States that 300mg  was sent in and that isn't what she takes and would like prescription for 150mg  sent instead. She hasn't taken any of the 300mg  and didn't get from pharmacy. Please send correct dosage to [facility] [address] [place] Ph: [phone] Appt 3/12

## 2023-03-19 NOTE — Telephone Encounter (Signed)
LVM RTC  OFFICE NOTE FROM 01/25/23  She reports that she notices improved mood in the morning after taking Wellbutrin XL and then mood and energy decline by early afternoon and do not improve after taking Wellbutrin SR. Pt reports that she would like to re-trial Wellbutrin XL 300 mg daily since she notices possibly more benefit from XL. Will increase Wellbutrin XL to 300 mg daily and discontinue Wellbutrin SR.

## 2023-03-19 NOTE — Telephone Encounter (Signed)
Spoke with patient she reports that the 300mg  Bupropion caused her to have migraines. She did not pick up the rx.

## 2023-03-21 ENCOUNTER — Other Ambulatory Visit (HOSPITAL_BASED_OUTPATIENT_CLINIC_OR_DEPARTMENT_OTHER): Payer: Self-pay

## 2023-03-21 ENCOUNTER — Ambulatory Visit (HOSPITAL_BASED_OUTPATIENT_CLINIC_OR_DEPARTMENT_OTHER): Payer: Medicare Other | Admitting: Orthopaedic Surgery

## 2023-03-21 DIAGNOSIS — M67959 Unspecified disorder of synovium and tendon, unspecified thigh: Secondary | ICD-10-CM | POA: Diagnosis not present

## 2023-03-21 MED ORDER — HYDROMORPHONE HCL 2 MG PO TABS
2.0000 mg | ORAL_TABLET | ORAL | 0 refills | Status: DC | PRN
  Filled 2023-03-21: qty 30, 5d supply, fill #0

## 2023-03-21 MED ORDER — ASPIRIN 81 MG PO TBEC
81.0000 mg | DELAYED_RELEASE_TABLET | Freq: Every day | ORAL | 12 refills | Status: DC
  Filled 2023-03-21: qty 30, 30d supply, fill #0

## 2023-03-21 MED ORDER — ACETAMINOPHEN 500 MG PO TABS
500.0000 mg | ORAL_TABLET | Freq: Three times a day (TID) | ORAL | 0 refills | Status: AC
  Filled 2023-03-21: qty 30, 10d supply, fill #0

## 2023-03-21 NOTE — Progress Notes (Signed)
Chief Complaint: Left hip pain     History of Present Illness:    Sabrina Duke is a 67 y.o. female presents today with ongoing left hip pain as a referral from my partner Dr. Magnus Ivan.  She has been having lateral based hip pain for the last several years.  She does walk with a Trendelenburg gait and a limp on the side.  She has been taking Tylenol.  She has had multiple injections including to the lateral trochanter which gave her several months of relief.  She has pain with longer periods of walking.  She is not able to lay directly on the side.  She has been doing physical therapy for the last 3 months.    Surgical History:   None  PMH/PSH/Family History/Social History/Meds/Allergies:    Past Medical History:  Diagnosis Date   Allergic rhinitis    Chronic kidney disease    Decreased bone density    Depression    GERD (gastroesophageal reflux disease)    Hypothyroid    Iron deficiency anemia    Low vitamin D level    Migraine    Osteoporosis    Past Surgical History:  Procedure Laterality Date   BIOPSY  03/10/2019   Procedure: BIOPSY;  Surgeon: Malissa Hippo, MD;  [facility];  Service: Endoscopy;;  gastric    BIOPSY  03/24/2020   Procedure: BIOPSY;  Surgeon: Malissa Hippo, MD;  [facility]: [facility];  Service: Endoscopy;;  duodenum gastric   COLONOSCOPY WITH PROPOFOL N/A 03/24/2020   Procedure: COLONOSCOPY WITH PROPOFOL;  Surgeon: Malissa Hippo, MD;  [facility]: [facility];  Service: Endoscopy;  Laterality: N/A;  1225   ESOPHAGOGASTRODUODENOSCOPY N/A 03/10/2019   Procedure: ESOPHAGOGASTRODUODENOSCOPY (EGD);  Surgeon: Malissa Hippo, MD;  [facility]: [facility];  Service: Endoscopy;  Laterality: N/A;  2:40-rescheduled 10/5 @11 :55am per office   ESOPHAGOGASTRODUODENOSCOPY (EGD) WITH PROPOFOL N/A 03/24/2020   Procedure: ESOPHAGOGASTRODUODENOSCOPY (EGD) WITH PROPOFOL;  Surgeon: Malissa Hippo, MD;  [facility]: [facility];  Service: Endoscopy;  Laterality: N/A;   hemroidectomy  1990   OVARIAN CYST SURGERY  1972   SHOULDER SURGERY Left 11/2017   TUBAL LIGATION  1997   uterine polyp removal  05/2015   Social History   Socioeconomic History   Marital status: Married    Spouse name: Not on file   Number of children: 1   Years of education: Not on file   Highest education level: High school graduate  Occupational History   Occupation: disabled  Tobacco Use   Smoking status: Former   Smokeless tobacco: Never  Advertising account planner   Vaping status: Never Used  Substance and Sexual Activity   Alcohol use: No   Drug use: No   Sexual activity: Not on file  Other Topics Concern   Not on file  Social History Narrative   Lives at home with husband   Right handed   No caffeine   Social Determinants of Health   Financial Resource Strain: Not on file  Food Insecurity: Not on file  Transportation Needs: Not on file  Physical Activity: Not on file  Stress: Not on file  Social Connections: Not on file   Family History  Problem Relation Age of Onset   Anxiety disorder Mother  Depression Mother    Bone cancer Mother    OCD Mother    Cerebral aneurysm Maternal Uncle    Drug abuse Brother    Depression Brother    Allergies  Allergen Reactions   Bentyl [Dicyclomine] Other (See Comments)    Patient states it caused leg and arm spasms.   Ivp Dye [Iodinated Contrast Media] Anaphylaxis   Antihistamines, Loratadine-Type Other (See Comments)    Sleepy   Ciprofloxacin Nausea And Vomiting   Dymista [Azelastine-Fluticasone] Other (See Comments)    Heachache   Gluten Meal    Lortab [Hydrocodone-Acetaminophen] Nausea And Vomiting   Macrobid [Nitrofurantoin] Other (See Comments)    Unknown   Montelukast Other (See Comments)    Sleep   Nsaids     Pt can tolerate small amounts of Aleve "mess my stomach up, have had ulcer in the past"   Nurtec [Rimegepant Sulfate] Nausea Only   Other     Patient  not sure about these allergies  Darvocet-   Mermantin- "makes me sick on my stomach"  Bio Citrate Calcium with Vitamin D- "hurt stomach"      Pepcid [Famotidine] Other (See Comments)    Headache   Prednisone     "makes me hyper and gives me a headache"   Prilosec [Omeprazole] Other (See Comments)    Headache   Rosuvastatin Calcium Other (See Comments)    Headaches.   Vantin [Cefpodoxime] Nausea And Vomiting   Zofran [Ondansetron] Other (See Comments)    Causes headaches per pt   Current Outpatient Medications  Medication Sig Dispense Refill   acetaminophen (TYLENOL) 500 MG tablet Take 1 tablet (500 mg total) by mouth every 8 (eight) hours for 10 days. 30 tablet 0   aspirin EC 81 MG tablet Take 1 tablet (81 mg total) by mouth daily. Swallow whole. 30 tablet 12   HYDROmorphone (DILAUDID) 2 MG tablet Take 1 tablet (2 mg total) by mouth every 4 (four) hours as needed for severe pain (pain score 7-10). 30 tablet 0   Astaxanthin 4 MG CAPS Take by mouth.     B-12 METHYLCOBALAMIN PO Take by mouth.     buPROPion (WELLBUTRIN SR) 150 MG 12 hr tablet Take 1 tablet (150 mg total) by mouth 2 (two) times daily. 60 tablet 0   Eszopiclone 3 MG TABS TAKE 1 TABLET BY MOUTH IMMEDIATELY BEFORE BEDTIME 30 tablet 5   levothyroxine (SYNTHROID) 50 MCG tablet Take 50 mcg by mouth daily before breakfast. Take 2 50 mg tablets daily     liothyronine (CYTOMEL) 5 MCG tablet Take 10 mcg by mouth daily. (Patient not taking: Reported on 02/28/2023)     LORazepam (ATIVAN) 0.5 MG tablet TAKE 1 TABLET BY MOUTH EVERY 8 HOURS 60 tablet 5   Magnesium 200 MG TABS Take 200 mg by mouth daily.      naratriptan (AMERGE) 2.5 MG tablet TAKE 1 TABLET (2.5 MG TOTAL) BY MOUTH AS NEEDED FOR MIGRAINE. TAKE ONE (1) TABLET AT ONSET OF HEADACHE IF RETURNS OR DOES NOT RESOLVE, MAY REPEAT AFTER 4 HOURS DO NOT EXCEED FIVE (5) MG IN 24 HOURS. 10 tablet 0   promethazine (PHENERGAN) 25 MG tablet Take 1 tablet (25 mg total) by mouth every 6  (six) hours as needed for nausea or vomiting. 12 tablet 0   promethazine (PHENERGAN) 25 MG tablet Take by mouth.     SUMAtriptan (IMITREX) 100 MG tablet Take 1 tablet (100 mg total) by mouth once as needed for up to  1 dose for migraine. May repeat once in 2 hours if headache persists or recurs. Must make an Appointment for Future Refill Request 10 tablet 11   topiramate (TOPAMAX) 50 MG tablet Take 3 tablets (150 mg total) by mouth at bedtime. 270 tablet 3   Vitamin D, Ergocalciferol, (DRISDOL) 1.25 MG (50000 UNIT) CAPS capsule Take 50,000 Units by mouth once a week.     No current facility-administered medications for this visit.   No results found.  Review of Systems:   A ROS was performed including pertinent positives and negatives as documented in the HPI.  Physical Exam :   Constitutional: NAD and appears stated age Neurological: Alert and oriented Psych: Appropriate affect and cooperative There were no vitals taken for this visit.   Comprehensive Musculoskeletal Exam:    Inspection Right Left  Skin No atrophy or gross abnormalities appreciated No atrophy or gross abnormalities appreciated  Palpation    Tenderness None Greater trochanter  Crepitus None None  Range of Motion    Flexion (passive) 120 120  Extension 30 30  IR 30 30  ER 45 45  Strength    Flexion  5/5 5/5  Extension 5/5 5/5  Special Tests    FABER Negative Negative  FADIR Negative Negative  ER Lag/Capsular Insufficiency Negative Negative  Instability Negative Negative  Sacroiliac pain Negative  Negative   Instability    Generalized Laxity No No  Neurologic    sciatic, femoral, obturator nerves intact to light sensation  Vascular/Lymphatic    DP pulse 2+ 2+  Lumbar Exam    Patient has symmetric lumbar range of motion with negative pain referral to hip   Positive Trendelenburg left hip  Imaging:   Xray (3 views left hip): Normal  MRI (left hip): Full-thickness tear of the gluteus medius  I  personally reviewed and interpreted the radiographs.   Assessment:   67 y.o. female Presents with a left hip gluteus medius tendon.  This is causing significant pain and difficulty with ambulation.  At this time she is having a hard time laying on the side.  She has been doing physical therapy now for the last several months.  She has had multiple trochanteric injections.  Given the fact that she has tried multiple conservative therapies we did discuss the possibility of a left hip gluteus medius repair.  I discussed the risks and limitations associated with this.  We discussed the rehab and she would like to participate in rehab at [facility] given her residence in [place].  Will plan to proceed with this.  Plan :    -Plan for left hip gluteus medius repair with collagen patch augmentation    After a lengthy discussion of treatment options, including risks, benefits, alternatives, complications of surgical and nonsurgical conservative options, the patient elected surgical repair.   The patient  is aware of the material risks  and complications including, but not limited to injury to adjacent structures, neurovascular injury, infection, numbness, bleeding, implant failure, thermal burns, stiffness, persistent pain, failure to heal, disease transmission from allograft, need for further surgery, dislocation, anesthetic risks, blood clots, risks of death,and others. The probabilities of surgical success and failure discussed with patient given their particular co-morbidities.The time and nature of expected rehabilitation and recovery was discussed.The patient's questions were all answered preoperatively.  No barriers to understanding were noted. I explained the natural history of the disease process and Rx rationale.  I explained to the patient what I considered to be reasonable  expectations given their personal situation.  The final treatment plan was arrived at through a shared patient decision making  process model.      I personally saw and evaluated the patient, and participated in the management and treatment plan.  Huel Cote, MD Attending Physician, Orthopedic Surgery  This document was dictated using Dragon voice recognition software. A reasonable attempt at proof reading has been made to minimize errors.

## 2023-03-26 ENCOUNTER — Encounter: Payer: Self-pay | Admitting: Family Medicine

## 2023-03-26 ENCOUNTER — Telehealth: Payer: Self-pay | Admitting: Family Medicine

## 2023-03-26 NOTE — Telephone Encounter (Signed)
Pt called stating that she was given 10 of her SUMAtriptan (IMITREX) 100 MG tablet instead of her usual 12. Pt would like to know why was she only given 10. Please advise.

## 2023-03-26 NOTE — Telephone Encounter (Signed)
I called pt back. Relayed Amy Lomax,NP message. She states she does not take naratriptan.  She asked why NP did not discuss with her at appt. I reiterated she should be limiting to 10 per month and reasons why. She states "that's fine" and ended call on her end.

## 2023-03-26 NOTE — Telephone Encounter (Signed)
Amy- I do see where she was getting qty 12 of sumatriptan in the past. Was there a reason the recent script was changed to 10? I looked back and could not find anything specific

## 2023-03-29 NOTE — Progress Notes (Signed)
[facility] Sabrina Duke 67 year old female Mar 30, 1956 Comm Pref:     HOME SLEEP TEST REPORT ( mail out by Watch PAT)   STUDY DATA:  03-28-2023    ORDERING CLINICIAN: Shawnie Dapper, NP for Naomie Dean, MD  REFERRING CLINICIANShawnee Knapp, NP ,  CC : PCP Jonathon Bellows   CLINICAL INFORMATION/HISTORY:  Here for migraine follow up. Pt states she averaging about 15 migraine monthly. Pt reports at times migraines are better, then weather causes most migraines.      HISTORY OF PRESENT ILLNESS:   02/28/23 ALL: Seen in follow up for migraines. She is a headache patient of Dr Trevor Mace was seen in [facility] from Dr Asa Lente in 2019. Has been followed by NPs ever since, the first follow up was on 02-06-2019 with Shawnie Dapper, NP. She was last seen 12/2021. We continued topiramate 150mg  at bedtime and sumatriptan and or naratriptan as needed. Since, she reports migraines are stable. She averages about 15 migraines headache days a month. She usually takes sumatriptan and this seems to abort migraines. She uses naratriptan for intractable headaches.Headaches are different than regular migraines. She describes bilateral maxillary and frontal pain. It is a nagging headache that starts around 11am. Usually worsens during the day. Sometimes she wakes up with headache. She reports being very nauseated, sensitive to light and sound. She stays hoarse. Sumatriptan does knock out headache. She is going through a full rx every month. Amerge usually helps break cycle. She had MRI in [place] on 11/29/2021 that was normal. She has chronic sinus symptoms. She reports antihistamines cause leg cramps. No obvious concerns of TMJ. We discussed possible sleep referral at last visit due to snoring, some morning headaches and dry mouth. She had a brother with sleep apnea. ESS 8.    Dr Dohmeier had seen this patient in a Sleep Consult in 2023 but the patient had not scheduled the HST.      Epworth  sleepiness score: /24.   BMI:  24.5 kg/m   Neck Circumference: na    FINDINGS:   Sleep Summary:   Total Recording Time (hours, min):  7 h 53      Total Sleep Time (hours, min):   5 h 53               Percent REM (%):   20.2                                     AASM based Respiratory Indices:   Calculated pAHI (per hour):   6.5/h    ( CMS 4.5/h)                       REM pAHI:    CMS 14.5/h                                              NREM pAHI:  CMS 2.0/h                            Supine AHI:  298 minutes recorded were in supine sleep, AHI at 4 % desaturation was 5.4/h  .  In prone sleep, the  AHI was 2.4/h and right lateral sleep position AHI by CMS criteria was 6.3/h                                                  Oxygen Saturation Statistics:    O2 Saturation Range (%):   d max saturation of 100 % was a mean saturation of 96%.                                     O2 Saturation (minutes) <89%: 0.4 minutes          Pulse Rate Statistics:   Pulse Mean (bpm):     70 bpm            Pulse Range:  from 63 to 96 bpm               IMPRESSION:  This HST confirms that a clinically significant degree of sleep apnea is not present, the AHI of less then 5/h doesn't  warrant intervention.  There was no bradycardia and no hypoxia present.    RECOMMENDATION: No clinically significant degree of sleep apnea / hypoxia was present and is therefor not a contributory factor to the headache disorder.      INTERPRETING PHYSICIAN:   Melvyn Novas, MD   Cc Dr Lucia Gaskins, Shawnie Dapper, NP

## 2023-04-04 ENCOUNTER — Encounter: Payer: Self-pay | Admitting: Family Medicine

## 2023-04-04 NOTE — Procedures (Signed)
[facility] Sabrina Duke 67 year old female 07-30-1955 Comm Pref:     HOME SLEEP TEST REPORT ( mail out by Watch PAT)   STUDY DATA:  03-28-2023    ORDERING CLINICIAN: Shawnie Dapper, NP for Naomie Dean, MD  REFERRING CLINICIANShawnee Knapp, NP ,  CC : PCP Jonathon Bellows   CLINICAL INFORMATION/HISTORY:  Here for migraine follow up. Pt states she averaging about 15 migraine monthly. Pt reports at times migraines are better, then weather causes most migraines.      HISTORY OF PRESENT ILLNESS:   02/28/23 ALL: Seen in follow up for migraines. She is a headache patient of Dr Trevor Mace was seen in [facility] from Dr Asa Lente in 2019. Has been followed by NPs ever since, the first follow up was on 02-06-2019 with Shawnie Dapper, NP. She was last seen 12/2021. We continued topiramate 150mg  at bedtime and sumatriptan and or naratriptan as needed. Since, she reports migraines are stable. She averages about 15 migraines headache days a month. She usually takes sumatriptan and this seems to abort migraines. She uses naratriptan for intractable headaches.Headaches are different than regular migraines. She describes bilateral maxillary and frontal pain. It is a nagging headache that starts around 11am. Usually worsens during the day. Sometimes she wakes up with headache. She reports being very nauseated, sensitive to light and sound. She stays hoarse. Sumatriptan does knock out headache. She is going through a full rx every month. Amerge usually helps break cycle. She had MRI in [place] on 11/29/2021 that was normal. She has chronic sinus symptoms. She reports antihistamines cause leg cramps. No obvious concerns of TMJ. We discussed possible sleep referral at last visit due to snoring, some morning headaches and dry mouth. She had a brother with sleep apnea. ESS 8.    Dr Denae Zulueta had seen this patient in a Sleep Consult in 2023 but the patient had not scheduled the HST.      Epworth sleepiness  score: /24.   BMI:  24.5 kg/m   Neck Circumference: na    FINDINGS:   Sleep Summary:   Total Recording Time (hours, min):  7 h 53      Total Sleep Time (hours, min):   5 h 53               Percent REM (%):   20.2                                     AASM based Respiratory Indices:   Calculated pAHI (per hour):   6.5/h    ( CMS 4.5/h)                       REM pAHI:    CMS 14.5/h                                              NREM pAHI:  CMS 2.0/h                            Supine AHI:  298 minutes recorded were in supine sleep, AHI at 4 % desaturation was 5.4/h  . In prone  sleep, the  AHI was 2.4/h and right lateral sleep position AHI by CMS criteria was 6.3/h      Snoring was recorded through a vibration detector on the patients chest wall and indicated a mean volume of 41 db. Mild snoring .                                               Oxygen Saturation Statistics:    O2 Saturation Range (%):   Nadir was at 83%,  max saturation was at 100 %  and the mean  02 saturation at 96%.                                     O2 Saturation (minutes) <89%: 0.4 minutes          Pulse Rate Statistics:   Pulse Mean (bpm):     70 bpm            Pulse Range:  from 63 to 96 bpm               IMPRESSION:  This HST confirms that a clinically significant degree of sleep apnea is not present, the AHI of less then 5/h doesn't  warrant intervention.  There was no bradycardia and no hypoxia present. The patient slept mostly in supine and experienced more apneas and hypopneas in that position. She can also reduce the mild snoring further by avoiding supine sleep.     RECOMMENDATION: No clinically significant degree of sleep apnea / hypoxia was present and is therefor not a contributory factor to the headache disorder.      INTERPRETING PHYSICIAN:   Melvyn Novas, MD   Cc Dr Lucia Gaskins, Shawnie Dapper, NP

## 2023-04-09 ENCOUNTER — Encounter: Payer: Self-pay | Admitting: *Deleted

## 2023-04-18 ENCOUNTER — Encounter: Payer: Self-pay | Admitting: Psychiatry

## 2023-04-24 ENCOUNTER — Other Ambulatory Visit: Payer: Self-pay | Admitting: Psychiatry

## 2023-04-25 NOTE — Telephone Encounter (Signed)
What dose is she taking? 

## 2023-04-25 NOTE — Telephone Encounter (Signed)
Pt called at 3:50p for refill of Buproprion to [facility].  She asked if Sabrina Duke would send in a few refills until she's able to get transferred to [name]'s new employment.  No upcoming appt scheduled.

## 2023-04-27 ENCOUNTER — Other Ambulatory Visit (HOSPITAL_BASED_OUTPATIENT_CLINIC_OR_DEPARTMENT_OTHER): Payer: Self-pay | Admitting: Orthopaedic Surgery

## 2023-04-27 DIAGNOSIS — M67959 Unspecified disorder of synovium and tendon, unspecified thigh: Secondary | ICD-10-CM

## 2023-04-27 DIAGNOSIS — S73192D Other sprain of left hip, subsequent encounter: Secondary | ICD-10-CM

## 2023-05-08 ENCOUNTER — Encounter (HOSPITAL_BASED_OUTPATIENT_CLINIC_OR_DEPARTMENT_OTHER): Payer: Self-pay | Admitting: Orthopaedic Surgery

## 2023-05-08 NOTE — Progress Notes (Signed)
Reviewed chart with Dr. Glade Stanford with chronic cough (> month) and pulm work up in care everywhere. Okay to proceed with surgery as scheduled

## 2023-05-14 ENCOUNTER — Ambulatory Visit: Payer: Self-pay | Admitting: Orthopaedic Surgery

## 2023-05-14 DIAGNOSIS — M67959 Unspecified disorder of synovium and tendon, unspecified thigh: Secondary | ICD-10-CM

## 2023-05-14 DIAGNOSIS — S73192D Other sprain of left hip, subsequent encounter: Secondary | ICD-10-CM

## 2023-05-15 ENCOUNTER — Ambulatory Visit (HOSPITAL_BASED_OUTPATIENT_CLINIC_OR_DEPARTMENT_OTHER)
Admission: RE | Admit: 2023-05-15 | Discharge: 2023-05-15 | Disposition: A | Discharge status: Home / Self Care | Payer: Medicare Other | Attending: Orthopaedic Surgery | Admitting: Orthopaedic Surgery

## 2023-05-15 ENCOUNTER — Telehealth: Payer: Self-pay | Admitting: Orthopaedic Surgery

## 2023-05-15 ENCOUNTER — Telehealth (HOSPITAL_BASED_OUTPATIENT_CLINIC_OR_DEPARTMENT_OTHER): Payer: Self-pay | Admitting: Physical Therapy

## 2023-05-15 ENCOUNTER — Other Ambulatory Visit: Payer: Self-pay

## 2023-05-15 ENCOUNTER — Encounter (HOSPITAL_BASED_OUTPATIENT_CLINIC_OR_DEPARTMENT_OTHER): Payer: Self-pay | Admitting: Orthopaedic Surgery

## 2023-05-15 ENCOUNTER — Encounter (HOSPITAL_BASED_OUTPATIENT_CLINIC_OR_DEPARTMENT_OTHER)
Admission: RE | Disposition: A | Discharge status: Home / Self Care | Payer: Self-pay | Source: Home / Self Care | Attending: Orthopaedic Surgery

## 2023-05-15 ENCOUNTER — Ambulatory Visit (HOSPITAL_BASED_OUTPATIENT_CLINIC_OR_DEPARTMENT_OTHER): Payer: Medicare Other | Admitting: Anesthesiology

## 2023-05-15 DIAGNOSIS — K219 Gastro-esophageal reflux disease without esophagitis: Secondary | ICD-10-CM | POA: Insufficient documentation

## 2023-05-15 DIAGNOSIS — M67952 Unspecified disorder of synovium and tendon, left thigh: Secondary | ICD-10-CM | POA: Diagnosis not present

## 2023-05-15 DIAGNOSIS — S76012A Strain of muscle, fascia and tendon of left hip, initial encounter: Secondary | ICD-10-CM | POA: Insufficient documentation

## 2023-05-15 DIAGNOSIS — F32A Depression, unspecified: Secondary | ICD-10-CM | POA: Insufficient documentation

## 2023-05-15 DIAGNOSIS — F419 Anxiety disorder, unspecified: Secondary | ICD-10-CM | POA: Diagnosis not present

## 2023-05-15 DIAGNOSIS — N189 Chronic kidney disease, unspecified: Secondary | ICD-10-CM | POA: Diagnosis not present

## 2023-05-15 DIAGNOSIS — X58XXXA Exposure to other specified factors, initial encounter: Secondary | ICD-10-CM | POA: Diagnosis not present

## 2023-05-15 DIAGNOSIS — Z87891 Personal history of nicotine dependence: Secondary | ICD-10-CM | POA: Insufficient documentation

## 2023-05-15 DIAGNOSIS — Z01818 Encounter for other preprocedural examination: Secondary | ICD-10-CM

## 2023-05-15 DIAGNOSIS — G43909 Migraine, unspecified, not intractable, without status migrainosus: Secondary | ICD-10-CM | POA: Diagnosis not present

## 2023-05-15 DIAGNOSIS — E039 Hypothyroidism, unspecified: Secondary | ICD-10-CM | POA: Insufficient documentation

## 2023-05-15 DIAGNOSIS — S73192D Other sprain of left hip, subsequent encounter: Secondary | ICD-10-CM | POA: Diagnosis not present

## 2023-05-15 DIAGNOSIS — M67959 Unspecified disorder of synovium and tendon, unspecified thigh: Secondary | ICD-10-CM

## 2023-05-15 HISTORY — PX: GLUTEUS MINIMUS REPAIR: SHX5843

## 2023-05-15 SURGERY — REPAIR, TENDON, GLUTEUS MINIMUS
Anesthesia: General | Site: Hip | Laterality: Left

## 2023-05-15 MED ORDER — MEPERIDINE HCL 25 MG/ML IJ SOLN: 6.2500 mg | INTRAMUSCULAR | Status: DC | PRN

## 2023-05-15 MED ORDER — VANCOMYCIN HCL IN DEXTROSE 1-5 GM/200ML-% IV SOLN
INTRAVENOUS | Status: AC
  Filled 2023-05-15: qty 200

## 2023-05-15 MED ORDER — ACETAMINOPHEN 160 MG/5ML PO SOLN: 325.0000 mg | ORAL | Status: DC | PRN

## 2023-05-15 MED ORDER — FENTANYL CITRATE (PF) 100 MCG/2ML IJ SOLN
25.0000 ug | INTRAMUSCULAR | Status: DC | PRN
  Administered 2023-05-15 (×3): 50 ug via INTRAVENOUS

## 2023-05-15 MED ORDER — GABAPENTIN 300 MG PO CAPS
300.0000 mg | ORAL_CAPSULE | Freq: Once | ORAL | Status: AC
  Administered 2023-05-15: 300 mg via ORAL

## 2023-05-15 MED ORDER — ROCURONIUM BROMIDE 10 MG/ML (PF) SYRINGE
PREFILLED_SYRINGE | INTRAVENOUS | Status: AC
  Filled 2023-05-15: qty 10

## 2023-05-15 MED ORDER — OXYCODONE HCL 5 MG PO TABS: 5.0000 mg | ORAL_TABLET | ORAL | 0 refills | Status: DC | PRN

## 2023-05-15 MED ORDER — TRANEXAMIC ACID-NACL 1000-0.7 MG/100ML-% IV SOLN
1000.0000 mg | INTRAVENOUS | Status: AC
  Administered 2023-05-15: 1000 mg via INTRAVENOUS

## 2023-05-15 MED ORDER — ONDANSETRON HCL 4 MG/2ML IJ SOLN
INTRAMUSCULAR | Status: DC | PRN
  Administered 2023-05-15: 4 mg via INTRAVENOUS

## 2023-05-15 MED ORDER — ROCURONIUM BROMIDE 100 MG/10ML IV SOLN
INTRAVENOUS | Status: DC | PRN
  Administered 2023-05-15: 50 mg via INTRAVENOUS

## 2023-05-15 MED ORDER — VANCOMYCIN HCL IN DEXTROSE 1-5 GM/200ML-% IV SOLN
1000.0000 mg | INTRAVENOUS | Status: AC
  Administered 2023-05-15: 1000 mg via INTRAVENOUS

## 2023-05-15 MED ORDER — FENTANYL CITRATE (PF) 100 MCG/2ML IJ SOLN
INTRAMUSCULAR | Status: AC
  Filled 2023-05-15: qty 2

## 2023-05-15 MED ORDER — ACETAMINOPHEN 500 MG PO TABS
ORAL_TABLET | ORAL | Status: AC
Start: 2023-05-15 — End: ?
  Filled 2023-05-15: qty 2

## 2023-05-15 MED ORDER — TRANEXAMIC ACID-NACL 1000-0.7 MG/100ML-% IV SOLN
INTRAVENOUS | Status: AC
  Filled 2023-05-15: qty 100

## 2023-05-15 MED ORDER — DEXAMETHASONE SODIUM PHOSPHATE 4 MG/ML IJ SOLN
INTRAMUSCULAR | Status: DC | PRN
  Administered 2023-05-15: 5 mg via INTRAVENOUS

## 2023-05-15 MED ORDER — BUPIVACAINE HCL (PF) 0.25 % IJ SOLN
INTRAMUSCULAR | Status: AC
  Filled 2023-05-15: qty 30

## 2023-05-15 MED ORDER — LIDOCAINE 2% (20 MG/ML) 5 ML SYRINGE
INTRAMUSCULAR | Status: AC
  Filled 2023-05-15: qty 10

## 2023-05-15 MED ORDER — HYDROMORPHONE HCL 1 MG/ML IJ SOLN
INTRAMUSCULAR | Status: AC
  Filled 2023-05-15: qty 0.5

## 2023-05-15 MED ORDER — ONDANSETRON HCL 4 MG/2ML IJ SOLN: 4.0000 mg | Freq: Once | INTRAMUSCULAR | Status: DC | PRN

## 2023-05-15 MED ORDER — MIDAZOLAM HCL 5 MG/5ML IJ SOLN
INTRAMUSCULAR | Status: DC | PRN
  Administered 2023-05-15: 2 mg via INTRAVENOUS

## 2023-05-15 MED ORDER — PHENYLEPHRINE HCL (PRESSORS) 10 MG/ML IV SOLN
INTRAVENOUS | Status: DC | PRN
  Administered 2023-05-15: 160 ug via INTRAVENOUS

## 2023-05-15 MED ORDER — ACETAMINOPHEN 500 MG PO TABS
1000.0000 mg | ORAL_TABLET | Freq: Once | ORAL | Status: AC
Start: 2023-05-15 — End: 2023-05-15
  Administered 2023-05-15: 1000 mg via ORAL

## 2023-05-15 MED ORDER — BUPIVACAINE HCL 0.25 % IJ SOLN
INTRAMUSCULAR | Status: DC | PRN
  Administered 2023-05-15: 20 mL

## 2023-05-15 MED ORDER — SUGAMMADEX SODIUM 200 MG/2ML IV SOLN
INTRAVENOUS | Status: DC | PRN
  Administered 2023-05-15: 100 mg via INTRAVENOUS

## 2023-05-15 MED ORDER — ONDANSETRON HCL 4 MG/2ML IJ SOLN
INTRAMUSCULAR | Status: AC
  Filled 2023-05-15: qty 2

## 2023-05-15 MED ORDER — OXYCODONE HCL 5 MG/5ML PO SOLN: 5.0000 mg | Freq: Once | ORAL | Status: DC | PRN

## 2023-05-15 MED ORDER — SODIUM CHLORIDE 0.9 % IV SOLN: INTRAVENOUS | Status: DC | PRN

## 2023-05-15 MED ORDER — LACTATED RINGERS IV SOLN
INTRAVENOUS | Status: DC
Start: 2023-05-15 — End: 2023-05-15

## 2023-05-15 MED ORDER — GENTAMICIN SULFATE 40 MG/ML IJ SOLN
5.0000 mg/kg | INTRAVENOUS | Status: DC
  Filled 2023-05-15: qty 7.5

## 2023-05-15 MED ORDER — ASPIRIN 325 MG PO TBEC: 325.0000 mg | DELAYED_RELEASE_TABLET | Freq: Every day | ORAL | 0 refills | Status: DC

## 2023-05-15 MED ORDER — ACETAMINOPHEN 500 MG PO TABS: 500.0000 mg | ORAL_TABLET | Freq: Three times a day (TID) | ORAL | 0 refills | Status: AC

## 2023-05-15 MED ORDER — MIDAZOLAM HCL 2 MG/2ML IJ SOLN
INTRAMUSCULAR | Status: AC
  Filled 2023-05-15: qty 2

## 2023-05-15 MED ORDER — FENTANYL CITRATE (PF) 100 MCG/2ML IJ SOLN
INTRAMUSCULAR | Status: DC | PRN
  Administered 2023-05-15: 50 ug via INTRAVENOUS
  Administered 2023-05-15 (×2): 25 ug via INTRAVENOUS
  Administered 2023-05-15: 100 ug via INTRAVENOUS

## 2023-05-15 MED ORDER — PROPOFOL 10 MG/ML IV BOLUS
INTRAVENOUS | Status: AC
  Filled 2023-05-15: qty 20

## 2023-05-15 MED ORDER — OXYCODONE HCL 5 MG PO TABS: 5.0000 mg | ORAL_TABLET | Freq: Once | ORAL | Status: DC | PRN

## 2023-05-15 MED ORDER — LIDOCAINE HCL (CARDIAC) PF 100 MG/5ML IV SOSY
PREFILLED_SYRINGE | INTRAVENOUS | Status: DC | PRN
  Administered 2023-05-15: 100 mg via INTRAVENOUS

## 2023-05-15 MED ORDER — HYDROMORPHONE HCL 1 MG/ML IJ SOLN
0.5000 mg | INTRAMUSCULAR | Status: DC | PRN
Start: 2023-05-15 — End: 2023-05-15
  Administered 2023-05-15: 0.5 mg via INTRAVENOUS

## 2023-05-15 MED ORDER — ACETAMINOPHEN 325 MG PO TABS: 325.0000 mg | ORAL_TABLET | ORAL | Status: DC | PRN

## 2023-05-15 MED ORDER — 0.9 % SODIUM CHLORIDE (POUR BTL) OPTIME
TOPICAL | Status: DC | PRN
  Administered 2023-05-15: 300 mL

## 2023-05-15 MED ORDER — DEXAMETHASONE SODIUM PHOSPHATE 10 MG/ML IJ SOLN
INTRAMUSCULAR | Status: AC
  Filled 2023-05-15: qty 1

## 2023-05-15 MED ORDER — CEFAZOLIN SODIUM-DEXTROSE 2-3 GM-%(50ML) IV SOLR
INTRAVENOUS | Status: DC | PRN
  Administered 2023-05-15: 2 g via INTRAVENOUS

## 2023-05-15 MED ORDER — PROPOFOL 10 MG/ML IV BOLUS
INTRAVENOUS | Status: DC | PRN
  Administered 2023-05-15: 150 mg via INTRAVENOUS
  Administered 2023-05-15: 100 mg via INTRAVENOUS

## 2023-05-15 MED ORDER — GABAPENTIN 300 MG PO CAPS
ORAL_CAPSULE | ORAL | Status: AC
  Filled 2023-05-15: qty 1

## 2023-05-15 SURGICAL SUPPLY — 49 items
ANCHOR JUGGERKNOT SOFT 1.5 (Anchor) ×2 IMPLANT
ANCHOR JUGGERKNOT SOFT 2.9 (Anchor) IMPLANT
ANCHOR SUT QUATTRO KNTLS 4.5 (Anchor) IMPLANT
BLADE SURG 10 STRL SS (BLADE) ×1 IMPLANT
BLADE SURG 15 STRL LF DISP TIS (BLADE) ×1 IMPLANT
CANISTER SUCT 1200ML W/VALVE (MISCELLANEOUS) ×1 IMPLANT
CHLORAPREP W/TINT 26 (MISCELLANEOUS) ×1 IMPLANT
CLSR STERI-STRIP ANTIMIC 1/2X4 (GAUZE/BANDAGES/DRESSINGS) IMPLANT
COOLER ICEMAN CLASSIC (MISCELLANEOUS) ×1 IMPLANT
COVER BACK TABLE 60X90IN (DRAPES) ×1 IMPLANT
COVER MAYO STAND STRL (DRAPES) ×1 IMPLANT
DERMABOND ADVANCED .7 DNX12 (GAUZE/BANDAGES/DRESSINGS) IMPLANT
DRAPE STERI IOBAN 125X83 (DRAPES) ×1 IMPLANT
DRAPE U-SHAPE 47X51 STRL (DRAPES) ×2 IMPLANT
DRSG AQUACEL AG ADV 3.5X 6 (GAUZE/BANDAGES/DRESSINGS) ×1 IMPLANT
DRSG AQUACEL AG ADV 3.5X10 (GAUZE/BANDAGES/DRESSINGS) IMPLANT
ELECT BLADE 4.0 EZ CLEAN MEGAD (MISCELLANEOUS)
ELECT REM PT RETURN 9FT ADLT (ELECTROSURGICAL) ×1
ELECTRODE BLDE 4.0 EZ CLN MEGD (MISCELLANEOUS) IMPLANT
ELECTRODE REM PT RTRN 9FT ADLT (ELECTROSURGICAL) ×1 IMPLANT
GAUZE PAD ABD 8X10 STRL (GAUZE/BANDAGES/DRESSINGS) IMPLANT
GAUZE XEROFORM 1X8 LF (GAUZE/BANDAGES/DRESSINGS) IMPLANT
GLOVE BIO SURGEON STRL SZ 6 (GLOVE) ×2 IMPLANT
GLOVE BIO SURGEON STRL SZ7.5 (GLOVE) ×2 IMPLANT
GLOVE BIOGEL PI IND STRL 6.5 (GLOVE) ×1 IMPLANT
GLOVE BIOGEL PI IND STRL 8 (GLOVE) ×1 IMPLANT
GOWN STRL REUS W/ TWL LRG LVL3 (GOWN DISPOSABLE) ×2 IMPLANT
GOWN STRL REUS W/TWL XL LVL3 (GOWN DISPOSABLE) ×1 IMPLANT
MANIFOLD NEPTUNE II (INSTRUMENTS) ×1 IMPLANT
NDL HYPO 22X1.5 SAFETY MO (MISCELLANEOUS) ×1 IMPLANT
NEEDLE HYPO 22X1.5 SAFETY MO (MISCELLANEOUS) ×1 IMPLANT
NS IRRIG 1000ML POUR BTL (IV SOLUTION) ×1 IMPLANT
PACK BASIN DAY SURGERY FS (CUSTOM PROCEDURE TRAY) ×1 IMPLANT
PAD COLD SHLDR WRAP-ON (PAD) ×1 IMPLANT
PENCIL SMOKE EVACUATOR (MISCELLANEOUS) ×1 IMPLANT
SLEEVE SCD COMPRESS KNEE MED (STOCKING) ×1 IMPLANT
SPIKE FLUID TRANSFER (MISCELLANEOUS) IMPLANT
SPONGE T-LAP 18X18 ~~LOC~~+RFID (SPONGE) ×1 IMPLANT
SUCTION TUBE FRAZIER 10FR DISP (SUCTIONS) ×1 IMPLANT
SUT ETHILON 3 0 PS 1 (SUTURE) IMPLANT
SUT MNCRL AB 3-0 PS2 27 (SUTURE) IMPLANT
SUT VIC AB 0 CT1 27XBRD ANBCTR (SUTURE) ×1 IMPLANT
SUT VIC AB 2-0 CT1 TAPERPNT 27 (SUTURE) ×1 IMPLANT
SYR 20ML LL LF (SYRINGE) ×1 IMPLANT
SYR BULB EAR ULCER 3OZ GRN STR (SYRINGE) ×1 IMPLANT
TOWEL GREEN STERILE FF (TOWEL DISPOSABLE) ×2 IMPLANT
TUBE CONNECTING 20X1/4 (TUBING) ×1 IMPLANT
UNDERPAD 30X36 HEAVY ABSORB (UNDERPADS AND DIAPERS) IMPLANT
YANKAUER SUCT BULB TIP NO VENT (SUCTIONS) ×1 IMPLANT

## 2023-05-15 NOTE — Op Note (Signed)
   Date of Surgery: 05/15/2023  INDICATIONS: Sabrina Duke is a 67 y.o.-year-old female with left hip gluteus medius tear.  The risk and benefits of the procedure were discussed in detail and documented in the pre-operative evaluation.   PREOPERATIVE DIAGNOSIS: 1. Left hip gluteus medius tear  POSTOPERATIVE DIAGNOSIS: Same.  PROCEDURE: 1. Left hip gluteus medius repair Left hip trochanteric bursectomy  SURGEON: Benancio Deeds MD  ASSISTANT: Kerby Less, ATC  ANESTHESIA:  general  IV FLUIDS AND URINE: See anesthesia record.  ANTIBIOTICS: Ancef  ESTIMATED BLOOD LOSS: 10 mL.  IMPLANTS:  Implant Name Type Inv. Item Serial No. Manufacturer Lot No. LRB No. Used Action  ANCHOR JUGGERKNOT SOFT 1.5 - [id number] Anchor ANCHOR JUGGERKNOT SOFT 1.5  [employer]IMMER [employer](ORTH,TRAU,BIO,SG) [id number] Left 1 Implanted  ANCHOR JUGGERKNOT SOFT 1.5 - [id number] Anchor ANCHOR JUGGERKNOT SOFT 1.5  [employer]IMMER [employer]ON(ORTH,TRAU,BIO,SG) [id number] Left 1 Implanted  ANCHOR SUT QUATTRO KNTLS 4.5 - [id number] Anchor ANCHOR SUT QUATTRO KNTLS 4.5  [employer]IMMER [employer]ON(ORTH,TRAU,BIO,SG) [id number] Left 1 Implanted  ANCHOR SUT QUATTRO KNTLS 4.5 - [id number] Anchor ANCHOR SUT QUATTRO KNTLS 4.5  [employer]IMMER [employer]ON(ORTH,TRAU,BIO,SG) [id number] Left 1 Implanted    DRAINS: None  CULTURES: None  COMPLICATIONS: none  DESCRIPTION OF PROCEDURE:  The patient was identified in the preoperative holding area.  The correct site was marked and confirmed according to nursing.  The patient was subsequently taken back to the operating room.  Antibiotics were given 1 hour prior to incision.  Anesthesia was induced.  He was placed in the lateral position with a beanbag positioner with care to pad the down leg and peroneal nerve.   Patient was prepped and draped in the usual sterile fashion.  Again timeout was performed confirming the correct side.  An approach was made over the lateral aspect of the greater trochanter.  Dissection was initially carried down  sharply with 15 blade and electrocautery was used to perform hemostasis.  A 15 blade was used to make a nick in the IT band which was completed with Mayo scissors.  At this point we encountered the gluteus medius tendon. There was overlying bursa was dissected out sharply with Metzenbaum scissors and removed. This was extremely thin and then completely torn.  This was debrided and the trochanteric lateral posterior facet was prepared using a Cobb.  We then mobilized the gluteus medius abductor muscles with an Allis clamp.  Excess bursal tissue was excised sharply with Mayo scissors.  A double row type configuration was then utilized with 2 Medial Row all suture anchors (a total of 8 limbs).  These were placed through the tissue and then brought to 2 Lateral Row Quattro anchors.  This produced an anatomic footprint restoration of the gluteus tendon.   The wounds were thoroughly irrigated.  We closed in layers of 0 Vicryl for the IT band, 2-0 Vicryl, and staples for skin. The patient was awoken and taken to the PACU.  All counts were correct at the end of the case and no complications.       POSTOPERATIVE PLAN: She will be 50% weightbearing for 2 weeks.  She will be seen immediately by physical therapy at [facility].  She will place on 2 weeks for aspirin for blood clot prevention  Benancio Deeds, MD 10:22 AM

## 2023-05-15 NOTE — Telephone Encounter (Signed)
LVM requesting patient call back to confirm post-op physical therapy appts following surgery on 12.10.2024

## 2023-05-15 NOTE — Anesthesia Procedure Notes (Signed)
Procedure Name: Intubation Date/Time: 05/15/2023 10:13 AM  Performed by: Karen Kitchens, CRNAPre-anesthesia Checklist: Patient identified, Emergency Drugs available, Suction available and Patient being monitored Patient Re-evaluated:Patient Re-evaluated prior to induction Oxygen Delivery Method: Circle system utilized Preoxygenation: Pre-oxygenation with 100% oxygen Induction Type: IV induction Ventilation: Mask ventilation without difficulty Laryngoscope Size: Mac and 4 Grade View: Grade I Tube type: Oral Tube size: 7.0 mm Number of attempts: 1 Airway Equipment and Method: Stylet and Oral airway Placement Confirmation: ETT inserted through vocal cords under direct vision, positive ETCO2, breath sounds checked- equal and bilateral and CO2 detector Secured at: 23 cm Tube secured with: Tape Dental Injury: Teeth and Oropharynx as per pre-operative assessment

## 2023-05-15 NOTE — Transfer of Care (Signed)
Immediate Anesthesia Transfer of Care Note  Patient: Sabrina Duke  Procedure(s) Performed: LEFT GLUTEUS MEDIUS  REPAIR (Left: Hip)  Patient Location: PACU  Anesthesia Type:General and Regional  Level of Consciousness: awake, alert , and patient cooperative  Airway & Oxygen Therapy: Patient Spontanous Breathing and Patient connected to face mask oxygen  Post-op Assessment: Report given to RN and Post -op Vital signs reviewed and stable  Post vital signs: Reviewed and stable  Last Vitals:  Vitals Value Taken Time  BP 120/53 05/15/23 1038  Temp    Pulse 94 05/15/23 1042  Resp 15 05/15/23 1042  SpO2 99 % 05/15/23 1042  Vitals shown include unfiled device data.  Last Pain:  Vitals:   05/15/23 0753  TempSrc: Temporal  PainSc: 0-No pain         Complications: No notable events documented.

## 2023-05-15 NOTE — Telephone Encounter (Signed)
Patient called and wants to know if it ok for her to keep taking Ativan after surgery? CB#[phone]

## 2023-05-15 NOTE — Anesthesia Preprocedure Evaluation (Addendum)
Anesthesia Evaluation  Patient identified by MRN, date of birth, ID band Patient awake    Reviewed: Allergy & Precautions, H&P , NPO status , Patient's Chart, lab work & pertinent test results, reviewed documented beta blocker date and time   Airway Mallampati: II  TM Distance: >3 FB Neck ROM: full    Dental no notable dental hx. (+) Teeth Intact, Dental Advisory Given, Caps   Pulmonary former smoker   Pulmonary exam normal breath sounds clear to auscultation       Cardiovascular Exercise Tolerance: Good negative cardio ROS Normal cardiovascular exam Rhythm:regular Rate:Normal     Neuro/Psych  Headaches PSYCHIATRIC DISORDERS Anxiety Depression       GI/Hepatic Neg liver ROS,GERD  Medicated,,  Endo/Other  Hypothyroidism    Renal/GU Renal diseasenegative Renal ROS  negative genitourinary   Musculoskeletal   Abdominal   Peds  Hematology  (+) Blood dyscrasia, anemia   Anesthesia Other Findings Current migraine  Reproductive/Obstetrics negative OB ROS                             Anesthesia Physical Anesthesia Plan  ASA: 3  Anesthesia Plan: General   Post-op Pain Management: Minimal or no pain anticipated   Induction: Intravenous  PONV Risk Score and Plan: 3 and Ondansetron, Dexamethasone and Treatment may vary due to age or medical condition  Airway Management Planned: Oral ETT and LMA  Additional Equipment: None  Intra-op Plan:   Post-operative Plan: Extubation in OR  Informed Consent: I have reviewed the patients History and Physical, chart, labs and discussed the procedure including the risks, benefits and alternatives for the proposed anesthesia with the patient or authorized representative who has indicated his/her understanding and acceptance.     Dental Advisory Given  Plan Discussed with: CRNA and Anesthesiologist  Anesthesia Plan Comments:         Anesthesia  Quick Evaluation

## 2023-05-15 NOTE — Progress Notes (Signed)
Pt has po dilaudid at home, last dose of dilaudid received here written on dc paperwork - educated pt and husband next dose should be at least 4 hours from dose received here. Educated pt and husband to call surgeons office for further guidance on taking prescribed ativan and sleep medication with dilaudid - educated that these should not be taken at the same time as dilaudid. Pt stated she has walker at home.

## 2023-05-15 NOTE — Telephone Encounter (Signed)
Called and advised.

## 2023-05-15 NOTE — Brief Op Note (Signed)
   Brief Op Note  Date of Surgery: 05/15/2023  Preoperative Diagnosis: LEFT GLUTEUS MEDIUS TEAR  Postoperative Diagnosis: same  Procedure: Procedure(s): LEFT GLUTEUS MEDIUS  REPAIR WITH POSSIBLE COLLAGEN PATCH AUGMENTATION  Implants: Implant Name Type Inv. Item Serial No. Manufacturer Lot No. LRB No. Used Action  ANCHOR JUGGERKNOT SOFT 1.5 - [id number] Anchor ANCHOR JUGGERKNOT SOFT 1.5  [employer](ORTH,TRAU,BIO,SG) [id number] Left 1 Implanted  ANCHOR JUGGERKNOT SOFT 1.5 - [id number] Anchor ANCHOR JUGGERKNOT SOFT 1.5  [employer](ORTH,TRAU,BIO,SG) [id number] Left 1 Implanted  ANCHOR SUT QUATTRO KNTLS 4.5 - [id number] Anchor ANCHOR SUT QUATTRO KNTLS 4.5  [employer](ORTH,TRAU,BIO,SG) [id number] Left 1 Implanted  ANCHOR SUT QUATTRO KNTLS 4.5 - [id number] Anchor ANCHOR SUT QUATTRO KNTLS 4.5  [employer](ORTH,TRAU,BIO,SG) [id number] Left 1 Implanted    Surgeons: Surgeon(s): Huel Cote, MD  Anesthesia: General    Estimated Blood Loss: See anesthesia record  Complications: None  Condition to PACU: Stable  Benancio Deeds, MD 05/15/2023 10:22 AM

## 2023-05-15 NOTE — Progress Notes (Signed)
Called Dr Steward Drone to clarify home medications and prescriptions for pain. Pt has dilaudid on home list, pt states it was previously prescribed for surgery by Dr Steward Drone. Script was sent for oxycodone by Dr Steward Drone, pt states she cannot take oxycodone, instructed patient not to pick up oxycodone per Dr Steward Drone.

## 2023-05-15 NOTE — H&P (Signed)
Chief Complaint: Left hip pain        History of Present Illness:      Sabrina Duke is a 67 y.o. female presents today with ongoing left hip pain as a referral from my partner Dr. Magnus Ivan.  She has been having lateral based hip pain for the last several years.  She does walk with a Trendelenburg gait and a limp on the side.  She has been taking Tylenol.  She has had multiple injections including to the lateral trochanter which gave her several months of relief.  She has pain with longer periods of walking.  She is not able to lay directly on the side.  She has been doing physical therapy for the last 3 months.       Surgical History:   None   PMH/PSH/Family History/Social History/Meds/Allergies:         Past Medical History:  Diagnosis Date   Allergic rhinitis     Chronic kidney disease     Decreased bone density     Depression     GERD (gastroesophageal reflux disease)     Hypothyroid     Iron deficiency anemia     Low vitamin D level     Migraine     Osteoporosis               Past Surgical History:  Procedure Laterality Date   BIOPSY   03/10/2019    Procedure: BIOPSY;  Surgeon: Malissa Hippo, MD;  [facility]: [facility];  Service: Endoscopy;;  gastric     BIOPSY   03/24/2020    Procedure: BIOPSY;  Surgeon: Malissa Hippo, MD;  Location: [facility];  Service: Endoscopy;;  duodenum gastric   COLONOSCOPY WITH PROPOFOL N/A 03/24/2020    Procedure: COLONOSCOPY WITH PROPOFOL;  Surgeon: Malissa Hippo, MD;  Location: [facility];  Service: Endoscopy;  Laterality: N/A;  1225   ESOPHAGOGASTRODUODENOSCOPY N/A 03/10/2019    Procedure: ESOPHAGOGASTRODUODENOSCOPY (EGD);  Surgeon: Malissa Hippo, MD;  Location: [facility];  Service: Endoscopy;  Laterality: N/A;  2:40-rescheduled 10/5 @11 :55am per office   ESOPHAGOGASTRODUODENOSCOPY (EGD) WITH PROPOFOL N/A 03/24/2020    Procedure: ESOPHAGOGASTRODUODENOSCOPY (EGD) WITH PROPOFOL;  Surgeon:  Malissa Hippo, MD;  Location: [facility];  Service: Endoscopy;  Laterality: N/A;   hemroidectomy   1990   OVARIAN CYST SURGERY   1972   SHOULDER SURGERY Left 11/2017   TUBAL LIGATION   1997   uterine polyp removal   05/2015        Social History         Socioeconomic History   Marital status: Married      Spouse name: Not on file   Number of children: 1   Years of education: Not on file   Highest education level: High school graduate  Occupational History   Occupation: disabled  Tobacco Use   Smoking status: Former   Smokeless tobacco: Never  Advertising account planner   Vaping status: Never Used  Substance and Sexual Activity   Alcohol use: No   Drug use: No   Sexual activity: Not on file  Other Topics Concern   Not on file  Social History Narrative    Lives at home with husband    Right handed    No caffeine    Social Determinants of Manufacturing engineer Strain: Not on file  Food Insecurity: Not on file  Transportation Needs:  Not on file  Physical Activity: Not on file  Stress: Not on file  Social Connections: Not on file         Family History  Problem Relation Age of Onset   Anxiety disorder Mother     Depression Mother     Bone cancer Mother     OCD Mother     Cerebral aneurysm Maternal Uncle     Drug abuse Brother     Depression Brother          Allergies       Allergies  Allergen Reactions   Bentyl [Dicyclomine] Other (See Comments)      Patient states it caused leg and arm spasms.   Ivp Dye [Iodinated Contrast Media] Anaphylaxis   Antihistamines, Loratadine-Type Other (See Comments)      Sleepy   Ciprofloxacin Nausea And Vomiting   Dymista [Azelastine-Fluticasone] Other (See Comments)      Heachache   Gluten Meal     Lortab [Hydrocodone-Acetaminophen] Nausea And Vomiting   Macrobid [Nitrofurantoin] Other (See Comments)      Unknown   Montelukast Other (See Comments)      Sleep   Nsaids        Pt can tolerate small amounts of  Aleve "mess my stomach up, have had ulcer in the past"   Nurtec [Rimegepant Sulfate] Nausea Only   Other        Patient not sure about these allergies   Darvocet-    Mermantin- "makes me sick on my stomach"   Bio Citrate Calcium with Vitamin D- "hurt stomach"         Pepcid [Famotidine] Other (See Comments)      Headache   Prednisone        "makes me hyper and gives me a headache"   Prilosec [Omeprazole] Other (See Comments)      Headache   Rosuvastatin Calcium Other (See Comments)      Headaches.   Vantin [Cefpodoxime] Nausea And Vomiting   Zofran [Ondansetron] Other (See Comments)      Causes headaches per pt            Current Outpatient Medications  Medication Sig Dispense Refill   acetaminophen (TYLENOL) 500 MG tablet Take 1 tablet (500 mg total) by mouth every 8 (eight) hours for 10 days. 30 tablet 0   aspirin EC 81 MG tablet Take 1 tablet (81 mg total) by mouth daily. Swallow whole. 30 tablet 12   HYDROmorphone (DILAUDID) 2 MG tablet Take 1 tablet (2 mg total) by mouth every 4 (four) hours as needed for severe pain (pain score 7-10). 30 tablet 0   Astaxanthin 4 MG CAPS Take by mouth.       B-12 METHYLCOBALAMIN PO Take by mouth.       buPROPion (WELLBUTRIN SR) 150 MG 12 hr tablet Take 1 tablet (150 mg total) by mouth 2 (two) times daily. 60 tablet 0   Eszopiclone 3 MG TABS TAKE 1 TABLET BY MOUTH IMMEDIATELY BEFORE BEDTIME 30 tablet 5   levothyroxine (SYNTHROID) 50 MCG tablet Take 50 mcg by mouth daily before breakfast. Take 2 50 mg tablets daily       liothyronine (CYTOMEL) 5 MCG tablet Take 10 mcg by mouth daily. (Patient not taking: Reported on 02/28/2023)       LORazepam (ATIVAN) 0.5 MG tablet TAKE 1 TABLET BY MOUTH EVERY 8 HOURS 60 tablet 5   Magnesium 200 MG TABS Take 200 mg by mouth daily.  naratriptan (AMERGE) 2.5 MG tablet TAKE 1 TABLET (2.5 MG TOTAL) BY MOUTH AS NEEDED FOR MIGRAINE. TAKE ONE (1) TABLET AT ONSET OF HEADACHE IF RETURNS OR DOES NOT RESOLVE,  MAY REPEAT AFTER 4 HOURS DO NOT EXCEED FIVE (5) MG IN 24 HOURS. 10 tablet 0   promethazine (PHENERGAN) 25 MG tablet Take 1 tablet (25 mg total) by mouth every 6 (six) hours as needed for nausea or vomiting. 12 tablet 0   promethazine (PHENERGAN) 25 MG tablet Take by mouth.       SUMAtriptan (IMITREX) 100 MG tablet Take 1 tablet (100 mg total) by mouth once as needed for up to 1 dose for migraine. May repeat once in 2 hours if headache persists or recurs. Must make an Appointment for Future Refill Request 10 tablet 11   topiramate (TOPAMAX) 50 MG tablet Take 3 tablets (150 mg total) by mouth at bedtime. 270 tablet 3   Vitamin D, Ergocalciferol, (DRISDOL) 1.25 MG (50000 UNIT) CAPS capsule Take 50,000 Units by mouth once a week.          No current facility-administered medications for this visit.      Imaging Results (Last 48 hours)  No results found.     Review of Systems:   A ROS was performed including pertinent positives and negatives as documented in the HPI.   Physical Exam :   Constitutional: NAD and appears stated age Neurological: Alert and oriented Psych: Appropriate affect and cooperative There were no vitals taken for this visit.    Comprehensive Musculoskeletal Exam:     Inspection Right Left  Skin No atrophy or gross abnormalities appreciated No atrophy or gross abnormalities appreciated  Palpation      Tenderness None Greater trochanter  Crepitus None None  Range of Motion      Flexion (passive) 120 120  Extension 30 30  IR 30 30  ER 45 45  Strength      Flexion  5/5 5/5  Extension 5/5 5/5  Special Tests      FABER Negative Negative  FADIR Negative Negative  ER Lag/Capsular Insufficiency Negative Negative  Instability Negative Negative  Sacroiliac pain Negative  Negative   Instability      Generalized Laxity No No  Neurologic      sciatic, femoral, obturator nerves intact to light sensation  Vascular/Lymphatic      DP pulse 2+ 2+  Lumbar Exam       Patient has symmetric lumbar range of motion with negative pain referral to hip    Positive Trendelenburg left hip   Imaging:   Xray (3 views left hip): Normal   MRI (left hip): Full-thickness tear of the gluteus medius   I personally reviewed and interpreted the radiographs.     Assessment:   67 y.o. female Presents with a left hip gluteus medius tendon.  This is causing significant pain and difficulty with ambulation.  At this time she is having a hard time laying on the side.  She has been doing physical therapy now for the last several months.  She has had multiple trochanteric injections.  Given the fact that she has tried multiple conservative therapies we did discuss the possibility of a left hip gluteus medius repair.  I discussed the risks and limitations associated with this.  We discussed the rehab and she would like to participate in rehab at [facility] given her residence in [place].  Will plan to proceed with this.   Plan :     -  Plan for left hip gluteus medius repair with collagen patch augmentation       After a lengthy discussion of treatment options, including risks, benefits, alternatives, complications of surgical and nonsurgical conservative options, the patient elected surgical repair.    The patient  is aware of the material risks  and complications including, but not limited to injury to adjacent structures, neurovascular injury, infection, numbness, bleeding, implant failure, thermal burns, stiffness, persistent pain, failure to heal, disease transmission from allograft, need for further surgery, dislocation, anesthetic risks, blood clots, risks of death,and others. The probabilities of surgical success and failure discussed with patient given their particular co-morbidities.The time and nature of expected rehabilitation and recovery was discussed.The patient's questions were all answered preoperatively.  No barriers to understanding were noted. I explained the  natural history of the disease process and Rx rationale.  I explained to the patient what I considered to be reasonable expectations given their personal situation.  The final treatment plan was arrived at through a shared patient decision making process model.           I personally saw and evaluated the patient, and participated in the management and treatment plan.   Huel Cote, MD Attending Physician, Orthopedic Surgery   This document was dictated using Dragon voice recognition software. A reasonable attempt at proof reading has been made to minimize errors.        Electronically signed by Huel Cote, MD at 03/21/2023  2:38 PM

## 2023-05-15 NOTE — Anesthesia Postprocedure Evaluation (Signed)
Anesthesia Post Note  Patient: Sabrina Duke  Procedure(s) Performed: LEFT GLUTEUS MEDIUS  REPAIR (Left: Hip)     Patient location during evaluation: PACU Anesthesia Type: General Level of consciousness: awake and alert Pain management: pain level controlled Vital Signs Assessment: post-procedure vital signs reviewed and stable Respiratory status: spontaneous breathing, nonlabored ventilation, respiratory function stable and patient connected to nasal cannula oxygen Cardiovascular status: blood pressure returned to baseline and stable Postop Assessment: no apparent nausea or vomiting Anesthetic complications: no   No notable events documented.  Last Vitals:  Vitals:   05/15/23 1217 05/15/23 1255  BP: (!) 119/91 (!) 118/59  Pulse: 86 86  Resp: 18 18  Temp: (!) 36.3 C   SpO2: 98% 98%    Last Pain:  Vitals:   05/15/23 1211  TempSrc:   PainSc: 8                  Austyn Perriello

## 2023-05-15 NOTE — Discharge Instructions (Addendum)
Discharge Instructions    Attending Surgeon: Huel Cote, MD Office Phone Number: [phone]   Diagnosis and Procedures:    Surgeries Performed: Left hip gluteus medius repair  Discharge Plan:    Diet: Resume usual diet. Begin with light or bland foods.  Drink plenty of fluids.  Activity:  50% weight bear left hip for 2 weeks. You are advised to go home directly from the hospital or surgical center. Restrict your activities.  GENERAL INSTRUCTIONS: 1.  Please apply ice to your wound to help with swelling and inflammation. This will improve your comfort and your overall recovery following surgery.     2. Please call Dr. Serena Croissant office at [phone] with questions Monday-Friday during business hours. If no one answers, please leave a message and someone should get back to the patient within 24 hours. For emergencies please call 911 or proceed to the emergency room.   3. Patient to notify surgical team if experiences any of the following: Bowel/Bladder dysfunction, uncontrolled pain, nerve/muscle weakness, incision with increased drainage or redness, nausea/vomiting and Fever greater than 101.0 F.  Be alert for signs of infection including redness, streaking, odor, fever or chills. Be alert for excessive pain or bleeding and notify your surgeon immediately.  WOUND INSTRUCTIONS:   Leave your dressing, cast, or splint in place until your post operative visit.  Keep it clean and dry.  Always keep the incision clean and dry until the staples/sutures are removed. If there is no drainage from the incision you should keep it open to air. If there is drainage from the incision you must keep it covered at all times until the drainage stops  Do not soak in a bath tub, hot tub, pool, lake or other body of water until 21 days after your surgery and your incision is completely dry and healed.  If you have removable sutures (or staples) they must be removed 10-14 days (unless  otherwise instructed) from the day of your surgery.     1)  Elevate the extremity as much as possible.  2)  Keep the dressing clean and dry.  3)  Please call us if the dressing becomes wet or dirty.  4)  If you are experiencing worsening pain or worsening swelling, please call.     MEDICATIONS: Resume all previous home medications at the previous prescribed dose and frequency unless otherwise noted Start taking the  pain medications on an as-needed basis as prescribed  Please taper down pain medication over the next week following surgery.  Ideally you should not require a refill of any narcotic pain medication.  Take pain medication with food to minimize nausea. In addition to the prescribed pain medication, you may take over-the-counter pain relievers such as Tylenol.  Do NOT take additional tylenol if your pain medication already has tylenol in it.  Aspirin 325mg  daily per instructions on bottle. Narcotic policy: Per [facility] clinic policy, our goal is ensure optimal postoperative pain control with a multimodal pain management strategy. For all [facility] patients, our goal is to wean post-operative narcotic medications by 6 weeks post-operatively, and many times sooner. If this is not possible due to utilization of pain medication prior to surgery, your [facility] doctor will support your acute post-operative pain control for the first 6 weeks postoperatively, with a plan to transition you back to your primary pain team following that. Cyndia Skeeters will work to ensure a Therapist, occupational.       FOLLOWUP INSTRUCTIONS: 1. Follow up at  the Physical Therapy Clinic 3-4 days following surgery. This appointment should be scheduled unless other arrangements have been made.The Physical Therapy scheduling number is [phone] if an appointment has not already been arranged.  2. Contact Dr. Serena Croissant office during office hours at [phone] or the practice after hours line at [phone] for  non-emergencies. For medical emergencies call 911.   Discharge Location: Home   Post Anesthesia Home Care Instructions  Activity: Get plenty of rest for the remainder of the day. A responsible individual must stay with you for 24 hours following the procedure.  For the next 24 hours, DO NOT: -Drive a car -Advertising copywriter -Drink alcoholic beverages -Take any medication unless instructed by your physician -Make any legal decisions or sign important papers.  Meals: Start with liquid foods such as gelatin or soup. Progress to regular foods as tolerated. Avoid greasy, spicy, heavy foods. If nausea and/or vomiting occur, drink only clear liquids until the nausea and/or vomiting subsides. Call your physician if vomiting continues.  Special Instructions/Symptoms: Your throat may feel dry or sore from the anesthesia or the breathing tube placed in your throat during surgery. If this causes discomfort, gargle with warm salt water. The discomfort should disappear within 24 hours.  If you had a scopolamine patch placed behind your ear for the management of post- operative nausea and/or vomiting:  1. The medication in the patch is effective for 72 hours, after which it should be removed.  Wrap patch in a tissue and discard in the trash. Wash hands thoroughly with soap and water. 2. You may remove the patch earlier than 72 hours if you experience unpleasant side effects which may include dry mouth, dizziness or visual disturbances. 3. Avoid touching the patch. Wash your hands with soap and water after contact with the patch.     Last received tylenol at 0920am

## 2023-05-16 ENCOUNTER — Encounter (HOSPITAL_BASED_OUTPATIENT_CLINIC_OR_DEPARTMENT_OTHER): Payer: Self-pay | Admitting: Orthopaedic Surgery

## 2023-05-19 ENCOUNTER — Ambulatory Visit (HOSPITAL_BASED_OUTPATIENT_CLINIC_OR_DEPARTMENT_OTHER): Payer: Medicare Other | Admitting: Physical Therapy

## 2023-05-22 ENCOUNTER — Ambulatory Visit (HOSPITAL_COMMUNITY): Payer: Medicare Other | Attending: Orthopaedic Surgery

## 2023-05-22 ENCOUNTER — Other Ambulatory Visit: Payer: Self-pay

## 2023-05-22 DIAGNOSIS — M67959 Unspecified disorder of synovium and tendon, unspecified thigh: Secondary | ICD-10-CM | POA: Diagnosis not present

## 2023-05-22 DIAGNOSIS — R262 Difficulty in walking, not elsewhere classified: Secondary | ICD-10-CM | POA: Insufficient documentation

## 2023-05-22 DIAGNOSIS — M25552 Pain in left hip: Secondary | ICD-10-CM | POA: Insufficient documentation

## 2023-05-22 NOTE — Therapy (Signed)
OUTPATIENT PHYSICAL THERAPY LOWER EXTREMITY EVALUATION   Patient Name: Sabrina Duke MRN: [id number] DOB:[birth year], 67 y.o., female Today's Date: 05/22/2023  END OF SESSION:  PT End of Session - 05/22/23 1651     Visit Number 1    Number of Visits 24    Date for PT Re-Evaluation 08/14/23    Authorization Type BCBS Medicare (requested 24 visits)    PT Start Time 1300    PT Stop Time 1400    PT Time Calculation (min) 60 min    Activity Tolerance Patient tolerated treatment well    Behavior During Therapy WFL for tasks assessed/performed            Past Medical History:  Diagnosis Date   Allergic rhinitis    Chronic kidney disease    Decreased bone density    Depression    GERD (gastroesophageal reflux disease)    Hypothyroid    Iron deficiency anemia    Low vitamin D level    Migraine    Osteoporosis    Past Surgical History:  Procedure Laterality Date   BIOPSY  03/10/2019   Procedure: BIOPSY;  Surgeon: Malissa Hippo, MD;  [facility];  Service: Endoscopy;;  gastric    BIOPSY  03/24/2020   Procedure: BIOPSY;  Surgeon: Malissa Hippo, MD;  [facility];  Service: Endoscopy;;  duodenum gastric   COLONOSCOPY WITH PROPOFOL N/A 03/24/2020   Procedure: COLONOSCOPY WITH PROPOFOL;  Surgeon: Malissa Hippo, MD;  [facility];  Service: Endoscopy;  Laterality: N/A;  1225   ESOPHAGOGASTRODUODENOSCOPY N/A 03/10/2019   Procedure: ESOPHAGOGASTRODUODENOSCOPY (EGD);  Surgeon: Malissa Hippo, MD;  [facility];  Service: Endoscopy;  Laterality: N/A;  2:40-rescheduled 10/5 @11 :55am per office   ESOPHAGOGASTRODUODENOSCOPY (EGD) WITH PROPOFOL N/A 03/24/2020   Procedure: ESOPHAGOGASTRODUODENOSCOPY (EGD) WITH PROPOFOL;  Surgeon: Malissa Hippo, MD;  [facility];  Service: Endoscopy;  Laterality: N/A;   GLUTEUS MINIMUS REPAIR Left 05/15/2023   Procedure: LEFT GLUTEUS MEDIUS  REPAIR;  Surgeon: Huel Cote, MD;   Location: [facility];  Service: Orthopedics;  Laterality: Left;   hemroidectomy  1990   OVARIAN CYST SURGERY  1972   SHOULDER SURGERY Left 11/2017   TUBAL LIGATION  1997   uterine polyp removal  05/2015   Patient Active Problem List   Diagnosis Date Noted   Tendinopathy of gluteus medius 05/15/2023   Loud snoring 04/20/2022   Uncontrolled morning headache 04/20/2022   Reactive depression 04/20/2022   Celiac disease 06/14/2020   Abdominal pain, left lower quadrant 06/14/2020   Constipation 06/14/2020   IBS (irritable bowel syndrome) 03/15/2020   Ileus (HCC) 01/16/2020   GERD (gastroesophageal reflux disease)    Hypothyroid    Abdominal pain, epigastric 02/03/2019   Nausea without vomiting 02/03/2019   Acid reflux 02/03/2019   Anxiety state 04/09/2018   Migraine, unspecified, intractable, with status migrainosus 04/09/2018   Insomnia 04/09/2018   Chronic migraine without aura without status migrainosus, not intractable 07/02/2017    PCP: Achilles Dunk, DO  REFERRING PROVIDER: Huel Cote, MD  REFERRING DIAG: [phone] (ICD-10-CM) - Tendinopathy of gluteus medius  THERAPY DIAG:  Pain in left hip  Difficulty in walking, not elsewhere classified  Rationale for Evaluation and Treatment: Rehabilitation  ONSET DATE: 05/15/23 S/P L gluteus medius repair  SUBJECTIVE:   SUBJECTIVE STATEMENT: EVAL: Arrives to the clinic with L hip pain s/p L gluteal repair 05/15/23. Denies any drainage on  the gauze. Patient has been having L hip pain for 2 years probably as result of her lifting a bag of clothes. Patient thought that she hurt her back. Patient was then given injections on hip but it did not help. Patient was then referred to PT and had PT for around 3 months which did not help. Patient then had injections on the groin which did not help. Patient then had MRI which revealed torn labrum and gluteus medius. Patient then had L gluteus medius repair on 05/15/23 and was  then referred to outpatient PT evaluation and management. Patient is 50% PWB on the L LE until next MD appointment  PERTINENT HISTORY: Migraines PAIN:  Are you having pain? Yes: NPRS scale: 9/10 Pain location: side of the L hip Pain description: aching, throbbing Aggravating factors: sitting with the legs propped, walking for around 5 minutes Relieving factors: laying with L LE propped  PRECAUTIONS: Other: follow rehab protocol  RED FLAGS: None   WEIGHT BEARING RESTRICTIONS: Yes 50% PWB on L LE  FALLS:  Has patient fallen in last 6 months? No  LIVING ENVIRONMENT: Lives with: lives with their spouse Lives in: House/apartment Stairs: Yes: External: 3 steps; bilateral but cannot reach both Has following equipment at home: Walker - 4 wheeled and cryotherapy  OCCUPATION: retired  PLOF: Independent and Independent with basic ADLs  PATIENT GOALS: "to be relieved from the pain and be able to walk"  NEXT MD VISIT: 05/25/23  OBJECTIVE:  Note: Objective measures were completed at Evaluation unless otherwise noted.  DIAGNOSTIC FINDINGS: None performed relevant to the area recently  PATIENT SURVEYS:  LEFS to be determined next session  COGNITION: Overall cognitive status: Within functional limits for tasks assessed     SENSATION: Not tested   POSTURE: rounded shoulders and forward head    LOWER EXTREMITY MMT:   MMT Right eval Left eval  Hip flexion 4   Hip extension    Hip abduction 4   Hip adduction    Hip internal rotation    Hip external rotation    Knee flexion 4 4-  Knee extension 4 3+  Ankle dorsiflexion 4 4  Ankle plantarflexion 4 4  Ankle inversion    Ankle eversion     (Blank rows = not tested)  LOWER EXTREMITY ROM:  AROM Right eval Left eval  Hip flexion WFL   Hip extension    Hip abduction [facility]   Hip adduction    Hip internal rotation    Hip external rotation    Knee flexion [facility] WFL  Knee extension [facility]  Ankle dorsiflexion [facility] WFL   Ankle plantarflexion [facility] WFL  Ankle inversion    Ankle eversion     (Blank rows = not tested)  FUNCTIONAL TESTS:  2 minute walk test: to be determined  GAIT: Distance walked: ~ 50 ft Assistive device utilized: Walker - 4 wheeled Level of assistance: Modified independence Comments: modified 3-point gait, PWB 50% on L   TODAY'S TREATMENT:  DATE:  05/22/23 Evaluation and patient education done  Gait training with modified 3-point gait and PWB on L for around 80 ft, mod Independence Stair negotiation, 3" step, 3 steps with min assist x 1 round   PATIENT EDUCATION:  Education details: Educated on the pathoanatomy of gluteus medius repair. Educated on the goals and course of rehab. Educated on precautions that needed to be observed. Education on proper gait pattern on level surface and on stairs Person educated: Patient Education method: Explanation, Demonstration, and Handouts Education comprehension: verbalized understanding and returned demonstration  HOME EXERCISE PROGRAM: Access Code: [email or web address] URL: [email or web address] Date: 05/22/2023 Prepared by: Krystal Clark  Patient Education - Walking with a Standard Walker - Partial Weight Bearing  ASSESSMENT:  CLINICAL IMPRESSION: Patient is a 67 y.o. female who was seen today for physical therapy evaluation and treatment for s/p L gluteal repair 1 week post-op. Patient's condition is further defined by difficulty with walking and stair negotiation due to pain and weakness. Skilled PT is required to address the impairments and functional limitations listed below.    OBJECTIVE IMPAIRMENTS: Abnormal gait, decreased activity tolerance, decreased mobility, difficulty walking, decreased strength, and pain.   ACTIVITY LIMITATIONS: carrying, lifting, bending, sitting, standing, squatting,  sleeping, stairs, and transfers  PARTICIPATION LIMITATIONS: meal prep, cleaning, laundry, driving, shopping, community activity, and yard work  PERSONAL FACTORS: 1 comorbidity: migraines  are also affecting patient's functional outcome.   REHAB POTENTIAL: Good  CLINICAL DECISION MAKING: Stable/uncomplicated  EVALUATION COMPLEXITY: Low   GOALS: Goals reviewed with patient? Yes  SHORT TERM GOALS: Target date: 07/03/23 Pt will demonstrate indep in HEP to facilitate carry-over of skilled services and improve functional outcomes Goal status: INITIAL  LONG TERM GOALS: Target date: 08/14/23  Pt will be able to ambulate independently > 200 ft on level surface Baseline: ambulatory with rollator Goal status: INITIAL  2.  Pt will be able to negotiate stairs with mod independence (use of railing)  Baseline: min assist Goal status: INITIAL  3.  Pt will demonstrate increase in LE strength to 4/5 to facilitate ease and safety in ambulation Baseline: 3+/5 Goal status: INITIAL  4.  Pt will increase LEFS by at least 9 points in order to demonstrate significant improvement in lower extremity function. Baseline: to be determined Goal status: INITIAL   PLAN:  PT FREQUENCY: 2x/week  PT DURATION: 12 weeks  PLANNED INTERVENTIONS: 97164- PT Re-evaluation, 97110-Therapeutic exercises, 97530- Therapeutic activity, [id number]- Neuromuscular re-education, 97535- Self Care, [id number]- Manual therapy, [phone]- Gait training, and Patient/Family education  PLAN FOR NEXT SESSION: Provide HEP and do LEFS. Begin LE strengthening and mobility per protocol.   Tish Frederickson. Junelle Hashemi, PT, DPT, OCS Board-Certified Clinical Specialist in Orthopedic PT PT Compact Privilege # ([place]): [id number] T 05/22/2023, 4:54 PM   Managed Medicaid Authorization Request  Visit Dx Codes: D63.875  Functional Tool Score: to be detrermined  For all possible CPT codes, reference the Planned Interventions line above.     Check all  conditions that are expected to impact treatment: {Conditions expected to impact treatment:None of these apply

## 2023-05-23 ENCOUNTER — Encounter (HOSPITAL_BASED_OUTPATIENT_CLINIC_OR_DEPARTMENT_OTHER): Payer: Medicare Other | Admitting: Physical Therapy

## 2023-05-25 ENCOUNTER — Ambulatory Visit (HOSPITAL_BASED_OUTPATIENT_CLINIC_OR_DEPARTMENT_OTHER): Payer: Medicare Other | Admitting: Orthopaedic Surgery

## 2023-05-25 ENCOUNTER — Encounter (HOSPITAL_BASED_OUTPATIENT_CLINIC_OR_DEPARTMENT_OTHER): Payer: Self-pay | Admitting: Orthopaedic Surgery

## 2023-05-25 DIAGNOSIS — S73192D Other sprain of left hip, subsequent encounter: Secondary | ICD-10-CM

## 2023-05-25 NOTE — Progress Notes (Signed)
Post Operative Evaluation    Procedure/Date of Surgery: Left hip gluteus medius repair 12/10  Interval History:     Presents today 2 weeks status post above procedure.  Overall she is doing very well.  She has been progressing her weightbearing with a walker.  She does have some soreness but pain is overall improving   PMH/PSH/Family History/Social History/Meds/Allergies:    Past Medical History:  Diagnosis Date   Allergic rhinitis    Chronic kidney disease    Decreased bone density    Depression    GERD (gastroesophageal reflux disease)    Hypothyroid    Iron deficiency anemia    Low vitamin D level    Migraine    Osteoporosis    Past Surgical History:  Procedure Laterality Date   BIOPSY  03/10/2019   Procedure: BIOPSY;  Surgeon: Malissa Hippo, MD;  [facility];  Service: Endoscopy;;  gastric    BIOPSY  03/24/2020   Procedure: BIOPSY;  Surgeon: Malissa Hippo, MD;  [facility];  Service: Endoscopy;;  duodenum gastric   COLONOSCOPY WITH PROPOFOL N/A 03/24/2020   Procedure: COLONOSCOPY WITH PROPOFOL;  Surgeon: Malissa Hippo, MD;  [facility];  Service: Endoscopy;  Laterality: N/A;  1225   ESOPHAGOGASTRODUODENOSCOPY N/A 03/10/2019   Procedure: ESOPHAGOGASTRODUODENOSCOPY (EGD);  Surgeon: Malissa Hippo, MD;  [facility];  Service: Endoscopy;  Laterality: N/A;  2:40-rescheduled 10/5 @11 :55am per office   ESOPHAGOGASTRODUODENOSCOPY (EGD) WITH PROPOFOL N/A 03/24/2020   Procedure: ESOPHAGOGASTRODUODENOSCOPY (EGD) WITH PROPOFOL;  Surgeon: Malissa Hippo, MD;  [facility];  Service: Endoscopy;  Laterality: N/A;   GLUTEUS MINIMUS REPAIR Left 05/15/2023   Procedure: LEFT GLUTEUS MEDIUS  REPAIR;  Surgeon: Huel Cote, MD;  Location: [facility];  Service: Orthopedics;  Laterality: Left;   hemroidectomy  1990   OVARIAN CYST SURGERY  1972   SHOULDER SURGERY Left  11/2017   TUBAL LIGATION  1997   uterine polyp removal  05/2015   Social History   Socioeconomic History   Marital status: Married    Spouse name: Not on file   Number of children: 1   Years of education: Not on file   Highest education level: High school graduate  Occupational History   Occupation: disabled  Tobacco Use   Smoking status: Former   Smokeless tobacco: Never  Advertising account planner   Vaping status: Never Used  Substance and Sexual Activity   Alcohol use: No   Drug use: No   Sexual activity: Not on file  Other Topics Concern   Not on file  Social History Narrative   Lives at home with husband   Right handed   No caffeine   Social Drivers of Corporate investment banker Strain: Not on file  Food Insecurity: Not on file  Transportation Needs: Not on file  Physical Activity: Not on file  Stress: Not on file  Social Connections: Not on file   Family History  Problem Relation Age of Onset   Anxiety disorder Mother    Depression Mother    Bone cancer Mother    OCD Mother    Cerebral aneurysm Maternal Uncle    Drug abuse Brother    Depression Brother    Allergies  Allergen Reactions  Bentyl [Dicyclomine] Other (See Comments)    Patient states it caused leg and arm spasms.   Ivp Dye [Iodinated Contrast Media] Anaphylaxis   Antihistamines, Loratadine-Type Other (See Comments)    Sleepy   Ciprofloxacin Nausea And Vomiting   Dymista [Azelastine-Fluticasone] Other (See Comments)    [place]achache   Gluten Meal    Lortab [Hydrocodone-Acetaminophen] Nausea And Vomiting   Macrobid [Nitrofurantoin] Other (See Comments)    Unknown   Montelukast Other (See Comments)    Sleep   Nsaids     Pt can tolerate small amounts of Aleve "mess my stomach up, have had ulcer in the past"   Nurtec [Rimegepant Sulfate] Nausea Only   Other     Patient not sure about these allergies  Darvocet-   Mermantin- "makes me sick on my stomach"  Bio Citrate Calcium with Vitamin D- "hurt  stomach"      Pepcid [Famotidine] Other (See Comments)    Headache   Prednisone     "makes me hyper and gives me a headache"   Prilosec [Omeprazole] Other (See Comments)    Headache   Rosuvastatin Calcium Other (See Comments)    Headaches.   Vantin [Cefpodoxime] Nausea And Vomiting   Zofran [Ondansetron] Other (See Comments)    Causes headaches per pt   Current Outpatient Medications  Medication Sig Dispense Refill   acetaminophen (TYLENOL) 500 MG tablet Take 1 tablet (500 mg total) by mouth every 8 (eight) hours for 10 days. 30 tablet 0   aspirin EC 325 MG tablet Take 1 tablet (325 mg total) by mouth daily. 14 tablet 0   B-12 METHYLCOBALAMIN PO Take by mouth.     Budeson-Glycopyrrol-Formoterol (BREZTRI AEROSPHERE IN) Inhale into the lungs.     buPROPion (WELLBUTRIN SR) 150 MG 12 hr tablet Take 1 tablet (150 mg total) by mouth 2 (two) times daily. 60 tablet 2   Eszopiclone 3 MG TABS TAKE 1 TABLET BY MOUTH IMMEDIATELY BEFORE BEDTIME 30 tablet 5   HYDROmorphone (DILAUDID) 2 MG tablet Take 1 tablet (2 mg total) by mouth every 4 (four) hours as needed for severe pain (pain score 7-10). 30 tablet 0   levothyroxine (SYNTHROID) 50 MCG tablet Take 50 mcg by mouth daily before breakfast. Take 2 50 mg tablets daily     liothyronine (CYTOMEL) 5 MCG tablet Take 10 mcg by mouth daily.     LORazepam (ATIVAN) 0.5 MG tablet TAKE 1 TABLET BY MOUTH EVERY 8 HOURS 60 tablet 5   Magnesium 200 MG TABS Take 200 mg by mouth daily.      oxyCODONE (ROXICODONE) 5 MG immediate release tablet Take 1 tablet (5 mg total) by mouth every 4 (four) hours as needed for severe pain (pain score 7-10) or breakthrough pain. 15 tablet 0   promethazine (PHENERGAN) 25 MG tablet Take 1 tablet (25 mg total) by mouth every 6 (six) hours as needed for nausea or vomiting. 12 tablet 0   SUMAtriptan (IMITREX) 100 MG tablet Take 1 tablet (100 mg total) by mouth once as needed for up to 1 dose for migraine. May repeat once in 2 hours if  headache persists or recurs. Must make an Appointment for Future Refill Request 10 tablet 11   topiramate (TOPAMAX) 50 MG tablet Take 3 tablets (150 mg total) by mouth at bedtime. 270 tablet 3   Vitamin D, Ergocalciferol, (DRISDOL) 1.25 MG (50000 UNIT) CAPS capsule Take 50,000 Units by mouth once a week.     No current facility-administered medications  for this visit.   No results found.  Review of Systems:   A ROS was performed including pertinent positives and negatives as documented in the HPI.   Musculoskeletal Exam:    There were no vitals taken for this visit.  Left hip incision is well-appearing without erythema or drainage.  She has 3 degrees internal/external rotation of the left hip without pain.  Walks with mild antalgic gait with walker.  Distal neurosensory exam is intact  Imaging:      I personally reviewed and interpreted the radiographs.   Assessment:   2 weeks status post left hip gluteus medius tendon repair overall doing well.  At this time she will advance her weightbearing as tolerated.  I have advised that she may switch to a 4 point cane.  I will plan to see her back in 4 weeks at which point expectation is that she is off of all assistive devices  Plan :    -Return to clinic in 4 weeks for reassessment      I personally saw and evaluated the patient, and participated in the management and treatment plan.  Huel Cote, MD Attending Physician, Orthopedic Surgery  This document was dictated using Dragon voice recognition software. A reasonable attempt at proof reading has been made to minimize errors.

## 2023-05-31 ENCOUNTER — Encounter (HOSPITAL_BASED_OUTPATIENT_CLINIC_OR_DEPARTMENT_OTHER): Payer: Medicare Other | Admitting: Physical Therapy

## 2023-06-05 ENCOUNTER — Ambulatory Visit (HOSPITAL_COMMUNITY): Payer: Medicare Other

## 2023-06-07 ENCOUNTER — Encounter (HOSPITAL_BASED_OUTPATIENT_CLINIC_OR_DEPARTMENT_OTHER): Payer: Medicare Other | Admitting: Physical Therapy

## 2023-06-08 ENCOUNTER — Encounter (HOSPITAL_COMMUNITY): Payer: Medicare Other

## 2023-06-12 ENCOUNTER — Encounter (HOSPITAL_BASED_OUTPATIENT_CLINIC_OR_DEPARTMENT_OTHER): Payer: Medicare Other | Admitting: Physical Therapy

## 2023-06-12 ENCOUNTER — Encounter (HOSPITAL_COMMUNITY): Payer: Medicare Other

## 2023-06-13 ENCOUNTER — Encounter (HOSPITAL_COMMUNITY): Payer: Medicare Other

## 2023-06-13 ENCOUNTER — Ambulatory Visit (HOSPITAL_COMMUNITY): Payer: Medicare Other | Attending: Orthopaedic Surgery

## 2023-06-13 DIAGNOSIS — R262 Difficulty in walking, not elsewhere classified: Secondary | ICD-10-CM | POA: Insufficient documentation

## 2023-06-13 DIAGNOSIS — M25552 Pain in left hip: Secondary | ICD-10-CM | POA: Diagnosis present

## 2023-06-13 NOTE — Therapy (Signed)
 OUTPATIENT PHYSICAL THERAPY LOWER EXTREMITY EVALUATION   Patient Name: Sabrina Duke MRN: [id number] DOB:Jun 27, 1955, 68 y.o., female Today's Date: 06/13/2023  END OF SESSION:  PT End of Session - 06/13/23 1334     Visit Number 2    Number of Visits 24    Date for PT Re-Evaluation 08/14/23    Authorization Type BCBS Medicare (requested 24 visits)    PT Start Time 1334    PT Stop Time 1418    PT Time Calculation (min) 44 min    Activity Tolerance Patient tolerated treatment well    Behavior During Therapy WFL for tasks assessed/performed            Past Medical History:  Diagnosis Date   Allergic rhinitis    Chronic kidney disease    Decreased bone density    Depression    GERD (gastroesophageal reflux disease)    Hypothyroid    Iron deficiency anemia    Low vitamin D level    Migraine    Osteoporosis    Past Surgical History:  Procedure Laterality Date   BIOPSY  03/10/2019   Procedure: BIOPSY;  Surgeon: Golda Claudis PENNER, MD;  [facility];  Service: Endoscopy;;  gastric    BIOPSY  03/24/2020   Procedure: BIOPSY;  Surgeon: Golda Claudis PENNER, MD;  [facility];  Service: Endoscopy;;  duodenum gastric   COLONOSCOPY WITH PROPOFOL  N/A 03/24/2020   Procedure: COLONOSCOPY WITH PROPOFOL ;  Surgeon: Golda Claudis PENNER, MD;  [facility];  Service: Endoscopy;  Laterality: N/A;  1225   ESOPHAGOGASTRODUODENOSCOPY N/A 03/10/2019   Procedure: ESOPHAGOGASTRODUODENOSCOPY (EGD);  Surgeon: Golda Claudis PENNER, MD;  [facility];  Service: Endoscopy;  Laterality: N/A;  2:40-rescheduled 10/5 @11 :55am per office   ESOPHAGOGASTRODUODENOSCOPY (EGD) WITH PROPOFOL  N/A 03/24/2020   Procedure: ESOPHAGOGASTRODUODENOSCOPY (EGD) WITH PROPOFOL ;  Surgeon: Golda Claudis PENNER, MD;  [facility];  Service: Endoscopy;  Laterality: N/A;   GLUTEUS MINIMUS REPAIR Left 05/15/2023   Procedure: LEFT GLUTEUS MEDIUS  REPAIR;  Surgeon: Genelle Standing, MD;  Location:  [facility];  Service: Orthopedics;  Laterality: Left;   hemroidectomy  1990   OVARIAN CYST SURGERY  1972   SHOULDER SURGERY Left 11/2017   TUBAL LIGATION  1997   uterine polyp removal  05/2015   Patient Active Problem List   Diagnosis Date Noted   Tendinopathy of gluteus medius 05/15/2023   Loud snoring 04/20/2022   Uncontrolled morning headache 04/20/2022   Reactive depression 04/20/2022   Celiac disease 06/14/2020   Abdominal pain, left lower quadrant 06/14/2020   Constipation 06/14/2020   IBS (irritable bowel syndrome) 03/15/2020   Ileus (HCC) 01/16/2020   GERD (gastroesophageal reflux disease)    Hypothyroid    Abdominal pain, epigastric 02/03/2019   Nausea without vomiting 02/03/2019   Acid reflux 02/03/2019   Anxiety state 04/09/2018   Migraine, unspecified, intractable, with status migrainosus 04/09/2018   Insomnia 04/09/2018   Chronic migraine without aura without status migrainosus, not intractable 07/02/2017    PCP: Fernand Charolett PEDLAR, DO  REFERRING PROVIDER: Genelle Standing, MD  REFERRING DIAG: [phone] (ICD-10-CM) - Tendinopathy of gluteus medius  THERAPY DIAG:  Pain in left hip  Difficulty in walking, not elsewhere classified  Rationale for Evaluation and Treatment: Rehabilitation  ONSET DATE: 05/15/23 S/P L gluteus medius repair  SUBJECTIVE:   SUBJECTIVE STATEMENT: I have been trying to get here but stuff has just been coming up; husband having back issues.  She saw surgeon 12/20 and he told her to increase her weightbearing and so she arrives with cane today. Returns to MD 06/29/23. 2/10 pain when trying to lie down to sleep.      EVAL: Arrives to the clinic with L hip pain s/p L gluteal repair 05/15/23. Denies any drainage on the gauze. Patient has been having L hip pain for 2 years probably as result of her lifting a bag of clothes. Patient thought that she hurt her back. Patient was then given injections on hip but it did not help.  Patient was then referred to PT and had PT for around 3 months which did not help. Patient then had injections on the groin which did not help. Patient then had MRI which revealed torn labrum and gluteus medius. Patient then had L gluteus medius repair on 05/15/23 and was then referred to outpatient PT evaluation and management. Patient is 50% PWB on the L LE until next MD appointment  PERTINENT HISTORY: Migraines PAIN:  Are you having pain? Yes: NPRS scale: 9/10 Pain location: side of the L hip Pain description: aching, throbbing Aggravating factors: sitting with the legs propped, walking for around 5 minutes Relieving factors: laying with L LE propped  PRECAUTIONS: Other: follow rehab protocol  RED FLAGS: None   WEIGHT BEARING RESTRICTIONS: Yes 50% PWB on L LE  FALLS:  Has patient fallen in last 6 months? No  LIVING ENVIRONMENT: Lives with: lives with their spouse Lives in: House/apartment Stairs: Yes: External: 3 steps; bilateral but cannot reach both Has following equipment at home: Walker - 4 wheeled and cryotherapy  OCCUPATION: retired  PLOF: Independent and Independent with basic ADLs  PATIENT GOALS: to be relieved from the pain and be able to walk  NEXT MD VISIT: 05/25/23  OBJECTIVE:  Note: Objective measures were completed at Evaluation unless otherwise noted.  DIAGNOSTIC FINDINGS: None performed relevant to the area recently  PATIENT SURVEYS:  LEFS to be determined next session  06/13/23 10/80; 12.5%  COGNITION: Overall cognitive status: Within functional limits for tasks assessed     SENSATION: Not tested   POSTURE: rounded shoulders and forward head    LOWER EXTREMITY MMT:   MMT Right eval Left eval  Hip flexion 4   Hip extension    Hip abduction 4   Hip adduction    Hip internal rotation    Hip external rotation    Knee flexion 4 4-  Knee extension 4 3+  Ankle dorsiflexion 4 4  Ankle plantarflexion 4 4  Ankle inversion    Ankle  eversion     (Blank rows = not tested)  LOWER EXTREMITY ROM:  AROM Right eval Left eval  Hip flexion WFL 108  Hip extension    Hip abduction [facility]   Hip adduction    Hip internal rotation    Hip external rotation    Knee flexion WFL WFL, 134  Knee extension [facility]  Ankle dorsiflexion [facility] WFL  Ankle plantarflexion [facility] WFL  Ankle inversion    Ankle eversion     (Blank rows = not tested)  FUNCTIONAL TESTS:  2 minute walk test: to be determined  GAIT: Distance walked: ~ 50 ft Assistive device utilized: Walker - 4 wheeled Level of assistance: Modified independence Comments: modified 3-point gait, PWB 50% on L   TODAY'S TREATMENT:  DATE:  06/13/23 Review of goals LEFS 10/80; 12.5% Gait training with SPC x 1 lap 2 MWT 123 ft with cane Measure of supine left knee flexion 134, and left hip flexion 108 Supine: Left Heel slides x 10 Left Quad sets 5 x 10 Hip adduction hooklying with pillow 5 hold x 10 Hip abduction hooklying with belt 5 hold x 10 HEP instruction  05/22/23 Evaluation and patient education done  Gait training with modified 3-point gait and PWB on L for around 80 ft, mod Independence Stair negotiation, 3 step, 3 steps with min assist x 1 round   PATIENT EDUCATION:  Education details: Educated on the pathoanatomy of gluteus medius repair. Educated on the goals and course of rehab. Educated on precautions that needed to be observed. Education on proper gait pattern on level surface and on stairs Person educated: Patient Education method: Explanation, Demonstration, and Handouts Education comprehension: verbalized understanding and returned demonstration  HOME EXERCISE PROGRAM: Access Code: [email or web address] URL: [email or web address] Date: 06/13/2023 Prepared by: [facility]  Exercises - Supine Heel Slide  - 2 x daily - 7 x  weekly - 1 sets - 10 reps - Supine Quad Set  - 2 x daily - 7 x weekly - 1 sets - 10 reps - 5 sec hold - Supine Hip Adduction Isometric with Ball  - 2 x daily - 7 x weekly - 1 sets - 10 reps - 5 sec hold - Hooklying Isometric Hip Abduction with Belt  - 2 x daily - 7 x weekly - 1 sets - 10 reps - 5 sec hold  Access Code: [email or web address] URL: [email or web address] Date: 05/22/2023 Prepared by: Vinie Haver  Patient Education - Walking with a Standard Walker - Partial Weight Bearing  ASSESSMENT:  CLINICAL IMPRESSION: 06/13/23 Today's session started with a review of goals; patient verbalizes agreement with goals. Patient has been sleeping on her right side letting left hip fall into adduction; instructed her on how to use a pillow to prevent that.   Performed LEFS today.  Performed 2 MWT discussed scar massage. Initiated HEP.  Patient with good mobility for this time s/p.  Patient will benefit from continued skilled therapy services to address deficits and promote return to optimal function.      Eval:Patient is a 68 y.o. female who was seen today for physical therapy evaluation and treatment for s/p L gluteal repair 1 week post-op. Patient's condition is further defined by difficulty with walking and stair negotiation due to pain and weakness. Skilled PT is required to address the impairments and functional limitations listed below.    OBJECTIVE IMPAIRMENTS: Abnormal gait, decreased activity tolerance, decreased mobility, difficulty walking, decreased strength, and pain.   ACTIVITY LIMITATIONS: carrying, lifting, bending, sitting, standing, squatting, sleeping, stairs, and transfers  PARTICIPATION LIMITATIONS: meal prep, cleaning, laundry, driving, shopping, community activity, and yard work  PERSONAL FACTORS: 1 comorbidity: migraines  are also affecting patient's functional outcome.   REHAB POTENTIAL: Good  CLINICAL DECISION MAKING: Stable/uncomplicated  EVALUATION COMPLEXITY:  Low   GOALS: Goals reviewed with patient? Yes  SHORT TERM GOALS: Target date: 07/03/23 Pt will demonstrate indep in HEP to facilitate carry-over of skilled services and improve functional outcomes Goal status: INITIAL  LONG TERM GOALS: Target date: 08/14/23  Pt will be able to ambulate independently > 200 ft on level surface Baseline: ambulatory with rollator Goal status: INITIAL  2.  Pt will be able to negotiate stairs with mod independence (use of railing)  Baseline: min assist Goal status:  INITIAL  3.  Pt will demonstrate increase in LE strength to 4/5 to facilitate ease and safety in ambulation Baseline: 3+/5 Goal status: INITIAL  4.  Pt will increase LEFS by at least 9 points in order to demonstrate significant improvement in lower extremity function. Baseline: to be determined Goal status: INITIAL   PLAN:  PT FREQUENCY: 2x/week  PT DURATION: 12 weeks  PLANNED INTERVENTIONS: 97164- PT Re-evaluation, 97110-Therapeutic exercises, 97530- Therapeutic activity, 97112- Neuromuscular re-education, 97535- Self Care, [id number]- Manual therapy, [phone]- Gait training, and Patient/Family education  PLAN FOR NEXT SESSION:   LE strengthening and mobility per protocol.  Upright bike; Returns to surgeon 1/24   2:22 PM, 06/13/23 Kallin Henk Small Arelene Moroni MPT [facility] [place] [phone]

## 2023-06-15 ENCOUNTER — Encounter (HOSPITAL_COMMUNITY): Payer: Medicare Other

## 2023-06-15 ENCOUNTER — Encounter (HOSPITAL_BASED_OUTPATIENT_CLINIC_OR_DEPARTMENT_OTHER): Payer: Medicare Other | Admitting: Physical Therapy

## 2023-06-19 ENCOUNTER — Encounter (HOSPITAL_BASED_OUTPATIENT_CLINIC_OR_DEPARTMENT_OTHER): Payer: Medicare Other | Admitting: Physical Therapy

## 2023-06-20 ENCOUNTER — Ambulatory Visit (HOSPITAL_COMMUNITY): Payer: Medicare Other

## 2023-06-20 DIAGNOSIS — M25552 Pain in left hip: Secondary | ICD-10-CM | POA: Diagnosis not present

## 2023-06-20 DIAGNOSIS — R262 Difficulty in walking, not elsewhere classified: Secondary | ICD-10-CM

## 2023-06-20 NOTE — Therapy (Signed)
 OUTPATIENT PHYSICAL THERAPY LOWER EXTREMITY TREATMENT   Patient Name: Sabrina Duke MRN: [id number] DOB:Mar 06, 1956, 68 y.o., female Today's Date: 06/20/2023  END OF SESSION:  PT End of Session - 06/20/23 1309     Visit Number 3    Number of Visits 24    Date for PT Re-Evaluation 08/14/23    Authorization Type BCBS Medicare (requested 24 visits)    PT Start Time 1305    PT Stop Time 1345    PT Time Calculation (min) 40 min    Activity Tolerance Patient tolerated treatment well    Behavior During Therapy WFL for tasks assessed/performed            Past Medical History:  Diagnosis Date   Allergic rhinitis    Chronic kidney disease    Decreased bone density    Depression    GERD (gastroesophageal reflux disease)    Hypothyroid    Iron deficiency anemia    Low vitamin D level    Migraine    Osteoporosis    Past Surgical History:  Procedure Laterality Date   BIOPSY  03/10/2019   Procedure: BIOPSY;  Surgeon: Ruby Corporal, MD;  [facility];  Service: Endoscopy;;  gastric    BIOPSY  03/24/2020   Procedure: BIOPSY;  Surgeon: Ruby Corporal, MD;  [facility];  Service: Endoscopy;;  duodenum gastric   COLONOSCOPY WITH PROPOFOL  N/A 03/24/2020   Procedure: COLONOSCOPY WITH PROPOFOL ;  Surgeon: Ruby Corporal, MD;  [facility];  Service: Endoscopy;  Laterality: N/A;  1225   ESOPHAGOGASTRODUODENOSCOPY N/A 03/10/2019   Procedure: ESOPHAGOGASTRODUODENOSCOPY (EGD);  Surgeon: Ruby Corporal, MD;  [facility];  Service: Endoscopy;  Laterality: N/A;  2:40-rescheduled 10/5 @11 :55am per office   ESOPHAGOGASTRODUODENOSCOPY (EGD) WITH PROPOFOL  N/A 03/24/2020   Procedure: ESOPHAGOGASTRODUODENOSCOPY (EGD) WITH PROPOFOL ;  Surgeon: Ruby Corporal, MD;  [facility];  Service: Endoscopy;  Laterality: N/A;   GLUTEUS MINIMUS REPAIR Left 05/15/2023   Procedure: LEFT GLUTEUS MEDIUS  REPAIR;  Surgeon: Wilhelmenia Harada, MD;  Location:  [facility];  Service: Orthopedics;  Laterality: Left;   hemroidectomy  1990   OVARIAN CYST SURGERY  1972   SHOULDER SURGERY Left 11/2017   TUBAL LIGATION  1997   uterine polyp removal  05/2015   Patient Active Problem List   Diagnosis Date Noted   Tendinopathy of gluteus medius 05/15/2023   Loud snoring 04/20/2022   Uncontrolled morning headache 04/20/2022   Reactive depression 04/20/2022   Celiac disease 06/14/2020   Abdominal pain, left lower quadrant 06/14/2020   Constipation 06/14/2020   IBS (irritable bowel syndrome) 03/15/2020   Ileus (HCC) 01/16/2020   GERD (gastroesophageal reflux disease)    Hypothyroid    Abdominal pain, epigastric 02/03/2019   Nausea without vomiting 02/03/2019   Acid reflux 02/03/2019   Anxiety state 04/09/2018   Migraine, unspecified, intractable, with status migrainosus 04/09/2018   Insomnia 04/09/2018   Chronic migraine without aura without status migrainosus, not intractable 07/02/2017    PCP: Graylon Leader, DO  REFERRING PROVIDER: Wilhelmenia Harada, MD  REFERRING DIAG: [phone] (ICD-10-CM) - Tendinopathy of gluteus medius  THERAPY DIAG:  Pain in left hip  Difficulty in walking, not elsewhere classified  Rationale for Evaluation and Treatment: Rehabilitation  ONSET DATE: 05/15/23 S/P L gluteus medius repair  SUBJECTIVE:   SUBJECTIVE STATEMENT: Returns to MD 06/29/23. Hip is feeling pretty good but her back has been bothering her since last  visit; 4/10 back pain, 2/10 left hip pain.      EVAL: Arrives to the clinic with L hip pain s/p L gluteal repair 05/15/23. Denies any drainage on the gauze. Patient has been having L hip pain for 2 years probably as result of her lifting a bag of clothes. Patient thought that she hurt her back. Patient was then given injections on hip but it did not help. Patient was then referred to PT and had PT for around 3 months which did not help. Patient then had injections on the groin which did  not help. Patient then had MRI which revealed torn labrum and gluteus medius. Patient then had L gluteus medius repair on 05/15/23 and was then referred to outpatient PT evaluation and management. Patient is 50% PWB on the L LE until next MD appointment  PERTINENT HISTORY: Migraines PAIN:  Are you having pain? Yes: NPRS scale: 9/10 Pain location: side of the L hip Pain description: aching, throbbing Aggravating factors: sitting with the legs propped, walking for around 5 minutes Relieving factors: laying with L LE propped  PRECAUTIONS: Other: follow rehab protocol  RED FLAGS: None   WEIGHT BEARING RESTRICTIONS: Yes 50% PWB on L LE  FALLS:  Has patient fallen in last 6 months? No  LIVING ENVIRONMENT: Lives with: lives with their spouse Lives in: House/apartment Stairs: Yes: External: 3 steps; bilateral but cannot reach both Has following equipment at home: Walker - 4 wheeled and cryotherapy  OCCUPATION: retired  PLOF: Independent and Independent with basic ADLs  PATIENT GOALS: "to be relieved from the pain and be able to walk"  NEXT MD VISIT: 05/25/23  OBJECTIVE:  Note: Objective measures were completed at Evaluation unless otherwise noted.  DIAGNOSTIC FINDINGS: None performed relevant to the area recently  PATIENT SURVEYS:  LEFS to be determined next session  06/13/23 10/80; 12.5%  COGNITION: Overall cognitive status: Within functional limits for tasks assessed     SENSATION: Not tested   POSTURE: rounded shoulders and forward head    LOWER EXTREMITY MMT:   MMT Right eval Left eval  Hip flexion 4   Hip extension    Hip abduction 4   Hip adduction    Hip internal rotation    Hip external rotation    Knee flexion 4 4-  Knee extension 4 3+  Ankle dorsiflexion 4 4  Ankle plantarflexion 4 4  Ankle inversion    Ankle eversion     (Blank rows = not tested)  LOWER EXTREMITY ROM:  AROM Right eval Left eval  Hip flexion WFL 108  Hip extension    Hip  abduction [facility]   Hip adduction    Hip internal rotation    Hip external rotation    Knee flexion WFL WFL, 134  Knee extension [facility]  Ankle dorsiflexion [facility] WFL  Ankle plantarflexion [facility] WFL  Ankle inversion    Ankle eversion     (Blank rows = not tested)  FUNCTIONAL TESTS:  2 minute walk test: to be determined  GAIT: Distance walked: ~ 50 ft Assistive device utilized: Walker - 4 wheeled Level of assistance: Modified independence Comments: modified 3-point gait, PWB 50% on L   TODAY'S TREATMENT:  DATE 06/20/23 Supine: Moist heat to low back in hooklying position x 5' to decrease pain and improve soft tissue extensibility Transverse abdominus 5" hold x 10 Bridge x 10 Hip adduction hooklying with pillow 5" hold x 10 Hip abduction hooklying with belt 5" hold x 10 Transverse abdominus with marching x 10 each  06/13/23 Review of goals LEFS 10/80; 12.5% Gait training with SPC x 1 lap 2 MWT 123 ft with cane Measure of supine left knee flexion 134, and left hip flexion 108 Supine: Left Heel slides x 10 Left Quad sets 5" x 10 Hip adduction hooklying with pillow 5" hold x 10 Hip abduction hooklying with belt 5" hold x 10 HEP instruction  05/22/23 Evaluation and patient education done  Gait training with modified 3-point gait and PWB on L for around 80 ft, mod Independence Stair negotiation, 3" step, 3 steps with min assist x 1 round   PATIENT EDUCATION:  Education details: Educated on the pathoanatomy of gluteus medius repair. Educated on the goals and course of rehab. Educated on precautions that needed to be observed. Education on proper gait pattern on level surface and on stairs Person educated: Patient Education method: Explanation, Demonstration, and Handouts Education comprehension: verbalized understanding and returned demonstration  HOME  EXERCISE PROGRAM: 06/20/23 Transverse abdominus bracing 5"hold x 10 Bridge x 10   Access Code: [email or web address] URL: [email or web address] Date: 06/13/2023 Prepared by: [facility] - Rehab  Exercises - Supine Heel Slide  - 2 x daily - 7 x weekly - 1 sets - 10 reps - Supine Quad Set  - 2 x daily - 7 x weekly - 1 sets - 10 reps - 5 sec hold - Supine Hip Adduction Isometric with Ball  - 2 x daily - 7 x weekly - 1 sets - 10 reps - 5 sec hold - Hooklying Isometric Hip Abduction with Belt  - 2 x daily - 7 x weekly - 1 sets - 10 reps - 5 sec hold  Access Code: [email or web address] URL: [email or web address] Date: 05/22/2023 Prepared by: Juline Ohara  Patient Education - Walking with a Standard Walker - Partial Weight Bearing  ASSESSMENT:  CLINICAL IMPRESSION: Having some low back pain today. Put patient in hooklying position with some heat to decrease tension on back; progressed exercise per protocol adding in some core strengthening today.  Likely having increased back pain due to change in walking pattern.  No increased pain with exercise today. Updated HEP.  Patient will benefit from continued skilled therapy services to address deficits and promote return to optimal function.      Eval:Patient is a 68 y.o. female who was seen today for physical therapy evaluation and treatment for s/p L gluteal repair 1 week post-op. Patient's condition is further defined by difficulty with walking and stair negotiation due to pain and weakness. Skilled PT is required to address the impairments and functional limitations listed below.    OBJECTIVE IMPAIRMENTS: Abnormal gait, decreased activity tolerance, decreased mobility, difficulty walking, decreased strength, and pain.   ACTIVITY LIMITATIONS: carrying, lifting, bending, sitting, standing, squatting, sleeping, stairs, and transfers  PARTICIPATION LIMITATIONS: meal prep, cleaning, laundry, driving, shopping, community activity, and yard  work  PERSONAL FACTORS: 1 comorbidity: migraines  are also affecting patient's functional outcome.   REHAB POTENTIAL: Good  CLINICAL DECISION MAKING: Stable/uncomplicated  EVALUATION COMPLEXITY: Low   GOALS: Goals reviewed with patient? Yes  SHORT TERM GOALS: Target date: 07/03/23 Pt will demonstrate indep in HEP to facilitate carry-over of skilled services and improve functional  outcomes Goal status: INITIAL  LONG TERM GOALS: Target date: 08/14/23  Pt will be able to ambulate independently > 200 ft on level surface Baseline: ambulatory with rollator Goal status: INITIAL  2.  Pt will be able to negotiate stairs with mod independence (use of railing)  Baseline: min assist Goal status: INITIAL  3.  Pt will demonstrate increase in LE strength to 4/5 to facilitate ease and safety in ambulation Baseline: 3+/5 Goal status: INITIAL  4.  Pt will increase LEFS by at least 9 points in order to demonstrate significant improvement in lower extremity function. Baseline: to be determined Goal status: INITIAL   PLAN:  PT FREQUENCY: 2x/week  PT DURATION: 12 weeks  PLANNED INTERVENTIONS: 97164- PT Re-evaluation, 97110-Therapeutic exercises, 97530- Therapeutic activity, 97112- Neuromuscular re-education, 97535- Self Care, 29528- Manual therapy, [phone]- Gait training, and Patient/Family education  PLAN FOR NEXT SESSION:   LE strengthening and mobility per protocol.  Upright bike; TKEs,  Returns to surgeon 1/24   1:50 PM, 06/20/23 Yoshiaki Kreuser Small Ether Wolters MPT [place] [facility] [place] [phone]

## 2023-06-22 ENCOUNTER — Ambulatory Visit (HOSPITAL_COMMUNITY): Payer: Medicare Other

## 2023-06-22 ENCOUNTER — Encounter (HOSPITAL_BASED_OUTPATIENT_CLINIC_OR_DEPARTMENT_OTHER): Payer: Medicare Other | Admitting: Physical Therapy

## 2023-06-22 ENCOUNTER — Encounter (HOSPITAL_COMMUNITY): Payer: Self-pay

## 2023-06-22 DIAGNOSIS — M25552 Pain in left hip: Secondary | ICD-10-CM | POA: Diagnosis not present

## 2023-06-22 DIAGNOSIS — R262 Difficulty in walking, not elsewhere classified: Secondary | ICD-10-CM

## 2023-06-22 NOTE — Therapy (Signed)
OUTPATIENT PHYSICAL THERAPY LOWER EXTREMITY TREATMENT   Patient Name: Sabrina Duke MRN: [id number] DOB:Oct 08, 1955, 68 y.o., female Today's Date: 06/22/2023  END OF SESSION:  PT End of Session - 06/22/23 1439     Visit Number 4    Number of Visits 24    Date for PT Re-Evaluation 08/14/23    Authorization Type BCBS Medicare    Authorization Time Period [employer] approved 12 visits from 05/22/2023-11/17/2023 [phone]    Authorization - Visit Number 4    Progress Note Due on Visit 12    PT Start Time 1345    PT Stop Time 1432    PT Time Calculation (min) 47 min    Activity Tolerance Patient tolerated treatment well    Behavior During Therapy WFL for tasks assessed/performed             Past Medical History:  Diagnosis Date   Allergic rhinitis    Chronic kidney disease    Decreased bone density    Depression    GERD (gastroesophageal reflux disease)    Hypothyroid    Iron deficiency anemia    Low vitamin D level    Migraine    Osteoporosis    Past Surgical History:  Procedure Laterality Date   BIOPSY  03/10/2019   Procedure: BIOPSY;  Surgeon: Malissa Hippo, MD;  [facility];  Service: Endoscopy;;  gastric    BIOPSY  03/24/2020   Procedure: BIOPSY;  Surgeon: Malissa Hippo, MD;  [facility];  Service: Endoscopy;;  duodenum gastric   COLONOSCOPY WITH PROPOFOL N/A 03/24/2020   Procedure: COLONOSCOPY WITH PROPOFOL;  Surgeon: Malissa Hippo, MD;  [facility];  Service: Endoscopy;  Laterality: N/A;  1225   ESOPHAGOGASTRODUODENOSCOPY N/A 03/10/2019   Procedure: ESOPHAGOGASTRODUODENOSCOPY (EGD);  Surgeon: Malissa Hippo, MD;  [facility];  Service: Endoscopy;  Laterality: N/A;  2:40-rescheduled 10/5 @11 :55am per office   ESOPHAGOGASTRODUODENOSCOPY (EGD) WITH PROPOFOL N/A 03/24/2020   Procedure: ESOPHAGOGASTRODUODENOSCOPY (EGD) WITH PROPOFOL;  Surgeon: Malissa Hippo, MD;  [facility];  Service:  Endoscopy;  Laterality: N/A;   GLUTEUS MINIMUS REPAIR Left 05/15/2023   Procedure: LEFT GLUTEUS MEDIUS  REPAIR;  Surgeon: Huel Cote, MD;  Location: [facility];  Service: Orthopedics;  Laterality: Left;   hemroidectomy  1990   OVARIAN CYST SURGERY  1972   SHOULDER SURGERY Left 11/2017   TUBAL LIGATION  1997   uterine polyp removal  05/2015   Patient Active Problem List   Diagnosis Date Noted   Tendinopathy of gluteus medius 05/15/2023   Loud snoring 04/20/2022   Uncontrolled morning headache 04/20/2022   Reactive depression 04/20/2022   Celiac disease 06/14/2020   Abdominal pain, left lower quadrant 06/14/2020   Constipation 06/14/2020   IBS (irritable bowel syndrome) 03/15/2020   Ileus (HCC) 01/16/2020   GERD (gastroesophageal reflux disease)    Hypothyroid    Abdominal pain, epigastric 02/03/2019   Nausea without vomiting 02/03/2019   Acid reflux 02/03/2019   Anxiety state 04/09/2018   Migraine, unspecified, intractable, with status migrainosus 04/09/2018   Insomnia 04/09/2018   Chronic migraine without aura without status migrainosus, not intractable 07/02/2017    PCP: Achilles Dunk, DO  REFERRING PROVIDER: Huel Cote, MD  REFERRING DIAG: [phone] (ICD-10-CM) - Tendinopathy of gluteus medius  THERAPY DIAG:  Pain in left hip  Difficulty in walking, not elsewhere classified  Rationale for Evaluation and Treatment: Rehabilitation  ONSET DATE: 05/15/23  S/P L gluteus medius repair  SUBJECTIVE:   SUBJECTIVE STATEMENT: Pain level currently 2/10 this session.     EVAL: Arrives to the clinic with L hip pain s/p L gluteal repair 05/15/23. Denies any drainage on the gauze. Patient has been having L hip pain for 2 years probably as result of her lifting a bag of clothes. Patient thought that she hurt her back. Patient was then given injections on hip but it did not help. Patient was then referred to PT and had PT for around 3 months which did not  help. Patient then had injections on the groin which did not help. Patient then had MRI which revealed torn labrum and gluteus medius. Patient then had L gluteus medius repair on 05/15/23 and was then referred to outpatient PT evaluation and management. Patient is 50% PWB on the L LE until next MD appointment  PERTINENT HISTORY: Migraines PAIN:  Are you having pain? Yes: NPRS scale: 9/10 Pain location: side of the L hip Pain description: aching, throbbing Aggravating factors: sitting with the legs propped, walking for around 5 minutes Relieving factors: laying with L LE propped  PRECAUTIONS: Other: follow rehab protocol  RED FLAGS: None   WEIGHT BEARING RESTRICTIONS: Yes 50% PWB on L LE  FALLS:  Has patient fallen in last 6 months? No  LIVING ENVIRONMENT: Lives with: lives with their spouse Lives in: House/apartment Stairs: Yes: External: 3 steps; bilateral but cannot reach both Has following equipment at home: Walker - 4 wheeled and cryotherapy  OCCUPATION: retired  PLOF: Independent and Independent with basic ADLs  PATIENT GOALS: "to be relieved from the pain and be able to walk"  NEXT MD VISIT: 05/25/23  OBJECTIVE:  Note: Objective measures were completed at Evaluation unless otherwise noted.  DIAGNOSTIC FINDINGS: None performed relevant to the area recently  PATIENT SURVEYS:  LEFS to be determined next session  06/13/23 10/80; 12.5%  COGNITION: Overall cognitive status: Within functional limits for tasks assessed     SENSATION: Not tested   POSTURE: rounded shoulders and forward head    LOWER EXTREMITY MMT:   MMT Right eval Left eval  Hip flexion 4   Hip extension    Hip abduction 4   Hip adduction    Hip internal rotation    Hip external rotation    Knee flexion 4 4-  Knee extension 4 3+  Ankle dorsiflexion 4 4  Ankle plantarflexion 4 4  Ankle inversion    Ankle eversion     (Blank rows = not tested)  LOWER EXTREMITY ROM:  AROM Right eval  Left eval  Hip flexion WFL 108  Hip extension    Hip abduction [facility]   Hip adduction    Hip internal rotation    Hip external rotation    Knee flexion WFL WFL, 134  Knee extension [facility]  Ankle dorsiflexion [facility] WFL  Ankle plantarflexion [facility] WFL  Ankle inversion    Ankle eversion     (Blank rows = not tested)  FUNCTIONAL TESTS:  2 minute walk test: to be determined  GAIT: Distance walked: ~ 50 ft Assistive device utilized: Walker - 4 wheeled Level of assistance: Modified independence Comments: modified 3-point gait, PWB 50% on L   TODAY'S TREATMENT:  DATE 06/22/2023  -Recumbent bike x 5' -STM with crossfrictional massage along scar with jeans donned and STM along hip flexors, abductors and TLF. X 8 min -Supine bridge 2x10 for 5'' iso  -Hip adduction hooklying with pillow 5" hold x 10 -Hip abduction hooklying with belt 5" hold x 10 -Hooklying hip flexion isometric against hand 5'' x 10 -Quarter wall sit 5 x 10'' -TKE x 10 with RTB @ EOS walking 9ft without cane; antalgic through LLE  06/20/23 Supine: Moist heat to low back in hooklying position x 5' to decrease pain and improve soft tissue extensibility Transverse abdominus 5" hold x 10 Bridge x 10 Hip adduction hooklying with pillow 5" hold x 10 Hip abduction hooklying with belt 5" hold x 10 Transverse abdominus with marching x 10 each    PATIENT EDUCATION:  Education details: Educated on the pathoanatomy of gluteus medius repair. Educated on the goals and course of rehab. Educated on precautions that needed to be observed. Education on proper gait pattern on level surface and on stairs Person educated: Patient Education method: Explanation, Demonstration, and Handouts Education comprehension: verbalized understanding and returned demonstration  HOME EXERCISE PROGRAM: 06/20/23 Transverse  abdominus bracing 5"hold x 10 Bridge x 10   Access Code: [email or web address] URL: [email or web address] Date: 06/13/2023 Prepared by: [facility]  Exercises - Supine Heel Slide  - 2 x daily - 7 x weekly - 1 sets - 10 reps - Supine Quad Set  - 2 x daily - 7 x weekly - 1 sets - 10 reps - 5 sec hold - Supine Hip Adduction Isometric with Ball  - 2 x daily - 7 x weekly - 1 sets - 10 reps - 5 sec hold - Hooklying Isometric Hip Abduction with Belt  - 2 x daily - 7 x weekly - 1 sets - 10 reps - 5 sec hold  Access Code: [email or web address] URL: [email or web address] Date: 05/22/2023 Prepared by: Krystal Clark  Patient Education - Walking with a Standard Walker - Partial Weight Bearing Access Code: [email or web address] URL: [email or web address] Date: 06/22/2023 Prepared by: Starling Manns  Exercises - Hooklying Isometric Hip Flexion  - 1 x daily - 7 x weekly - 3 sets - 10 reps - 5 hold - Wall Quarter Squat  - 1 x daily - 7 x weekly - 3 sets - 10 reps - 10-15 hold - Standing Terminal Knee Extension with Resistance  - 1 x daily - 7 x weekly - 3 sets - 10 reps - 5 hold ASSESSMENT:  CLINICAL IMPRESSION: Pt tolerating session well today. Within 4-6 week portion of protocol. Session today addressed crossfriction massage of scar incision, continued gluteal strengthening and added quadriceps strengthening, pt will be in 6-8 phase next week and we will continue to progress hip strengthening within limits. Attempts walking 94ft without cane, and showing mild antalgic pattern and informed pt to take small steps to ease muscle strain and continue to practice safely at home. Patient will benefit from continued skilled therapy services to address deficits and promote return to optimal function.       OBJECTIVE IMPAIRMENTS: Abnormal gait, decreased activity tolerance, decreased mobility, difficulty walking, decreased strength, and pain.   ACTIVITY LIMITATIONS: carrying, lifting, bending,  sitting, standing, squatting, sleeping, stairs, and transfers  PARTICIPATION LIMITATIONS: meal prep, cleaning, laundry, driving, shopping, community activity, and yard work  PERSONAL FACTORS: 1 comorbidity: migraines  are also affecting patient's functional outcome.   REHAB POTENTIAL: Good  CLINICAL DECISION MAKING: Stable/uncomplicated  EVALUATION COMPLEXITY: Low  GOALS: Goals reviewed with patient? Yes  SHORT TERM GOALS: Target date: 07/03/23 Pt will demonstrate indep in HEP to facilitate carry-over of skilled services and improve functional outcomes Goal status: INITIAL  LONG TERM GOALS: Target date: 08/14/23  Pt will be able to ambulate independently > 200 ft on level surface Baseline: ambulatory with rollator Goal status: INITIAL  2.  Pt will be able to negotiate stairs with mod independence (use of railing)  Baseline: min assist Goal status: INITIAL  3.  Pt will demonstrate increase in LE strength to 4/5 to facilitate ease and safety in ambulation Baseline: 3+/5 Goal status: INITIAL  4.  Pt will increase LEFS by at least 9 points in order to demonstrate significant improvement in lower extremity function. Baseline: to be determined Goal status: INITIAL   PLAN:  PT FREQUENCY: 2x/week  PT DURATION: 12 weeks  PLANNED INTERVENTIONS: 97164- PT Re-evaluation, 97110-Therapeutic exercises, 97530- Therapeutic activity, 97112- Neuromuscular re-education, 97535- Self Care, 81191- Manual therapy, [phone]- Gait training, and Patient/Family education  PLAN FOR NEXT SESSION:   LE strengthening and mobility per protocol.  Upright bike; TKEs,  Returns to surgeon 1/24   2:42 PM, 06/22/23 Nelida Meuse PT, DPT [facility]-  [phone] office

## 2023-06-28 ENCOUNTER — Encounter (HOSPITAL_COMMUNITY): Payer: Medicare Other

## 2023-06-29 ENCOUNTER — Ambulatory Visit (HOSPITAL_BASED_OUTPATIENT_CLINIC_OR_DEPARTMENT_OTHER): Payer: Medicare Other | Admitting: Orthopaedic Surgery

## 2023-06-29 DIAGNOSIS — M67959 Unspecified disorder of synovium and tendon, unspecified thigh: Secondary | ICD-10-CM

## 2023-06-29 NOTE — Progress Notes (Signed)
Post Operative Evaluation    Procedure/Date of Surgery: Left hip gluteus medius repair 12/10  Interval History:     Presents today 6 weeks status post above procedure.  Overall she is doing very well.  She is now walking with just a cane.  Her pain is much improved.  She has been unable to attend physical therapy recently as her husband has been dealing with lumbar radiculopathy type issues   PMH/PSH/Family History/Social History/Meds/Allergies:    Past Medical History:  Diagnosis Date   Allergic rhinitis    Chronic kidney disease    Decreased bone density    Depression    GERD (gastroesophageal reflux disease)    Hypothyroid    Iron deficiency anemia    Low vitamin D level    Migraine    Osteoporosis    Past Surgical History:  Procedure Laterality Date   BIOPSY  03/10/2019   Procedure: BIOPSY;  Surgeon: Malissa Hippo, MD;  [facility];  Service: Endoscopy;;  gastric    BIOPSY  03/24/2020   Procedure: BIOPSY;  Surgeon: Malissa Hippo, MD;  [facility];  Service: Endoscopy;;  duodenum gastric   COLONOSCOPY WITH PROPOFOL N/A 03/24/2020   Procedure: COLONOSCOPY WITH PROPOFOL;  Surgeon: Malissa Hippo, MD;  [facility];  Service: Endoscopy;  Laterality: N/A;  1225   ESOPHAGOGASTRODUODENOSCOPY N/A 03/10/2019   Procedure: ESOPHAGOGASTRODUODENOSCOPY (EGD);  Surgeon: Malissa Hippo, MD;  [facility];  Service: Endoscopy;  Laterality: N/A;  2:40-rescheduled 10/5 @11 :55am per office   ESOPHAGOGASTRODUODENOSCOPY (EGD) WITH PROPOFOL N/A 03/24/2020   Procedure: ESOPHAGOGASTRODUODENOSCOPY (EGD) WITH PROPOFOL;  Surgeon: Malissa Hippo, MD;  [facility];  Service: Endoscopy;  Laterality: N/A;   GLUTEUS MINIMUS REPAIR Left 05/15/2023   Procedure: LEFT GLUTEUS MEDIUS  REPAIR;  Surgeon: Huel Cote, MD;  Location: [facility];  Service: Orthopedics;  Laterality: Left;    hemroidectomy  1990   OVARIAN CYST SURGERY  1972   SHOULDER SURGERY Left 11/2017   TUBAL LIGATION  1997   uterine polyp removal  05/2015   Social History   Socioeconomic History   Marital status: Married    Spouse name: Not on file   Number of children: 1   Years of education: Not on file   Highest education level: High school graduate  Occupational History   Occupation: disabled  Tobacco Use   Smoking status: Former   Smokeless tobacco: Never  Advertising account planner   Vaping status: Never Used  Substance and Sexual Activity   Alcohol use: No   Drug use: No   Sexual activity: Not on file  Other Topics Concern   Not on file  Social History Narrative   Lives at home with husband   Right handed   No caffeine   Social Drivers of Corporate investment banker Strain: Not on file  Food Insecurity: Not on file  Transportation Needs: Not on file  Physical Activity: Not on file  Stress: Not on file  Social Connections: Not on file   Family History  Problem Relation Age of Onset   Anxiety disorder Mother    Depression Mother    Bone cancer Mother    OCD Mother    Cerebral aneurysm Maternal Uncle    Drug  abuse Brother    Depression Brother    Allergies  Allergen Reactions   Bentyl [Dicyclomine] Other (See Comments)    Patient states it caused leg and arm spasms.   Ivp Dye [Iodinated Contrast Media] Anaphylaxis   Antihistamines, Loratadine-Type Other (See Comments)    Sleepy   Ciprofloxacin Nausea And Vomiting   Dymista [Azelastine-Fluticasone] Other (See Comments)    Heachache   Gluten Meal    Lortab [Hydrocodone-Acetaminophen] Nausea And Vomiting   Macrobid [Nitrofurantoin] Other (See Comments)    Unknown   Montelukast Other (See Comments)    Sleep   Nsaids     Pt can tolerate small amounts of Aleve "mess my stomach up, have had ulcer in the past"   Nurtec [Rimegepant Sulfate] Nausea Only   Other     Patient not sure about these allergies  Darvocet-   Mermantin-  "makes me sick on my stomach"  Bio Citrate Calcium with Vitamin D- "hurt stomach"      Pepcid [Famotidine] Other (See Comments)    Headache   Prednisone     "makes me hyper and gives me a headache"   Prilosec [Omeprazole] Other (See Comments)    Headache   Rosuvastatin Calcium Other (See Comments)    Headaches.   Vantin [Cefpodoxime] Nausea And Vomiting   Zofran [Ondansetron] Other (See Comments)    Causes headaches per pt   Current Outpatient Medications  Medication Sig Dispense Refill   aspirin EC 325 MG tablet Take 1 tablet (325 mg total) by mouth daily. 14 tablet 0   B-12 METHYLCOBALAMIN PO Take by mouth.     Budeson-Glycopyrrol-Formoterol (BREZTRI AEROSPHERE IN) Inhale into the lungs.     buPROPion (WELLBUTRIN SR) 150 MG 12 hr tablet Take 1 tablet (150 mg total) by mouth 2 (two) times daily. 60 tablet 2   Eszopiclone 3 MG TABS TAKE 1 TABLET BY MOUTH IMMEDIATELY BEFORE BEDTIME 30 tablet 5   HYDROmorphone (DILAUDID) 2 MG tablet Take 1 tablet (2 mg total) by mouth every 4 (four) hours as needed for severe pain (pain score 7-10). 30 tablet 0   levothyroxine (SYNTHROID) 50 MCG tablet Take 50 mcg by mouth daily before breakfast. Take 2 50 mg tablets daily     liothyronine (CYTOMEL) 5 MCG tablet Take 10 mcg by mouth daily.     LORazepam (ATIVAN) 0.5 MG tablet TAKE 1 TABLET BY MOUTH EVERY 8 HOURS 60 tablet 5   Magnesium 200 MG TABS Take 200 mg by mouth daily.      oxyCODONE (ROXICODONE) 5 MG immediate release tablet Take 1 tablet (5 mg total) by mouth every 4 (four) hours as needed for severe pain (pain score 7-10) or breakthrough pain. 15 tablet 0   promethazine (PHENERGAN) 25 MG tablet Take 1 tablet (25 mg total) by mouth every 6 (six) hours as needed for nausea or vomiting. 12 tablet 0   SUMAtriptan (IMITREX) 100 MG tablet Take 1 tablet (100 mg total) by mouth once as needed for up to 1 dose for migraine. May repeat once in 2 hours if headache persists or recurs. Must make an  Appointment for Future Refill Request 10 tablet 11   topiramate (TOPAMAX) 50 MG tablet Take 3 tablets (150 mg total) by mouth at bedtime. 270 tablet 3   Vitamin D, Ergocalciferol, (DRISDOL) 1.25 MG (50000 UNIT) CAPS capsule Take 50,000 Units by mouth once a week.     No current facility-administered medications for this visit.   No results found.  Review of Systems:   A ROS was performed including pertinent positives and negatives as documented in the HPI.   Musculoskeletal Exam:    There were no vitals taken for this visit.  Left hip incision is well-appearing without erythema or drainage.  She has 3 degrees internal/external rotation of the left hip without pain.  Walks with mild antalgic gait with walker.  Distal neurosensory exam is intact  Imaging:      I personally reviewed and interpreted the radiographs.   Assessment:   6 weeks status post left hip gluteus medius tendon repair overall doing well.  She is progressing her weightbearing nicely.  She is now just using a cane.  This time she will continue to work on strengthening and improving her endurance.  I will plan to see her back in 6 weeks for final check  Plan :    -Return to clinic in 6 weeks for reassessment      I personally saw and evaluated the patient, and participated in the management and treatment plan.  Huel Cote, MD Attending Physician, Orthopedic Surgery  This document was dictated using Dragon voice recognition software. A reasonable attempt at proof reading has been made to minimize errors.

## 2023-07-04 ENCOUNTER — Encounter (HOSPITAL_COMMUNITY): Payer: Self-pay

## 2023-07-04 ENCOUNTER — Ambulatory Visit (HOSPITAL_COMMUNITY): Payer: Medicare Other

## 2023-07-04 DIAGNOSIS — M25552 Pain in left hip: Secondary | ICD-10-CM | POA: Diagnosis not present

## 2023-07-04 DIAGNOSIS — R262 Difficulty in walking, not elsewhere classified: Secondary | ICD-10-CM

## 2023-07-04 NOTE — Therapy (Signed)
OUTPATIENT PHYSICAL THERAPY LOWER EXTREMITY TREATMENT   Patient Name: Sabrina Duke MRN: [id number] DOB:1956/03/04, 68 y.o., female Today's Date: 07/04/2023  END OF SESSION:  PT End of Session - 07/04/23 1511     Visit Number 5    Number of Visits 24    Date for PT Re-Evaluation 08/14/23    Authorization Time Period [employer] approved 12 visits from 05/22/2023-11/17/2023 [phone]    Authorization - Visit Number 5    Authorization - Number of Visits 12    PT Start Time 1432    PT Stop Time 1511    PT Time Calculation (min) 39 min    Activity Tolerance Patient tolerated treatment well    Behavior During Therapy WFL for tasks assessed/performed              Past Medical History:  Diagnosis Date   Allergic rhinitis    Chronic kidney disease    Decreased bone density    Depression    GERD (gastroesophageal reflux disease)    Hypothyroid    Iron deficiency anemia    Low vitamin D level    Migraine    Osteoporosis    Past Surgical History:  Procedure Laterality Date   BIOPSY  03/10/2019   Procedure: BIOPSY;  Surgeon: Malissa Hippo, MD;  [facility]: [facility];  Service: Endoscopy;;  gastric    BIOPSY  03/24/2020   Procedure: BIOPSY;  Surgeon: Malissa Hippo, MD;  Location: [facility];  Service: Endoscopy;;  duodenum gastric   COLONOSCOPY WITH PROPOFOL N/A 03/24/2020   Procedure: COLONOSCOPY WITH PROPOFOL;  Surgeon: Malissa Hippo, MD;  [facility]: [facility];  Service: Endoscopy;  Laterality: N/A;  1225   ESOPHAGOGASTRODUODENOSCOPY N/A 03/10/2019   Procedure: ESOPHAGOGASTRODUODENOSCOPY (EGD);  Surgeon: Malissa Hippo, MD;  Location: [facility];  Service: Endoscopy;  Laterality: N/A;  2:40-rescheduled 10/5 @11 :55am per office   ESOPHAGOGASTRODUODENOSCOPY (EGD) WITH PROPOFOL N/A 03/24/2020   Procedure: ESOPHAGOGASTRODUODENOSCOPY (EGD) WITH PROPOFOL;  Surgeon: Malissa Hippo, MD;  [facility]: [facility];  Service: Endoscopy;  Laterality: N/A;    GLUTEUS MINIMUS REPAIR Left 05/15/2023   Procedure: LEFT GLUTEUS MEDIUS  REPAIR;  Surgeon: Huel Cote, MD;  Location: [facility];  Service: Orthopedics;  Laterality: Left;   hemroidectomy  1990   OVARIAN CYST SURGERY  1972   SHOULDER SURGERY Left 11/2017   TUBAL LIGATION  1997   uterine polyp removal  05/2015   Patient Active Problem List   Diagnosis Date Noted   Tendinopathy of gluteus medius 05/15/2023   Loud snoring 04/20/2022   Uncontrolled morning headache 04/20/2022   Reactive depression 04/20/2022   Celiac disease 06/14/2020   Abdominal pain, left lower quadrant 06/14/2020   Constipation 06/14/2020   IBS (irritable bowel syndrome) 03/15/2020   Ileus (HCC) 01/16/2020   GERD (gastroesophageal reflux disease)    Hypothyroid    Abdominal pain, epigastric 02/03/2019   Nausea without vomiting 02/03/2019   Acid reflux 02/03/2019   Anxiety state 04/09/2018   Migraine, unspecified, intractable, with status migrainosus 04/09/2018   Insomnia 04/09/2018   Chronic migraine without aura without status migrainosus, not intractable 07/02/2017    PCP: Achilles Dunk, DO  REFERRING PROVIDER: Huel Cote, MD  REFERRING DIAG: [phone] (ICD-10-CM) - Tendinopathy of gluteus medius  THERAPY DIAG:  Pain in left hip  Difficulty in walking, not elsewhere classified  Rationale for Evaluation and Treatment: Rehabilitation  ONSET DATE: 05/15/23 S/P L gluteus medius repair  SUBJECTIVE:   SUBJECTIVE STATEMENT: Pt reports that about 11 days ago pt reporting throwing trash over L hip and since then has been difficulty with walking without the cane.     EVAL: Arrives to the clinic with L hip pain s/p L gluteal repair 05/15/23. Denies any drainage on the gauze. Patient has been having L hip pain for 2 years probably as result of her lifting a bag of clothes. Patient thought that she hurt her back. Patient was then given injections on hip but it did not help. Patient was  then referred to PT and had PT for around 3 months which did not help. Patient then had injections on the groin which did not help. Patient then had MRI which revealed torn labrum and gluteus medius. Patient then had L gluteus medius repair on 05/15/23 and was then referred to outpatient PT evaluation and management. Patient is 50% PWB on the L LE until next MD appointment  PERTINENT HISTORY: Migraines PAIN:  Are you having pain? Yes: NPRS scale: 9/10 Pain location: side of the L hip Pain description: aching, throbbing Aggravating factors: sitting with the legs propped, walking for around 5 minutes Relieving factors: laying with L LE propped  PRECAUTIONS: Other: follow rehab protocol  RED FLAGS: None   WEIGHT BEARING RESTRICTIONS: Yes 50% PWB on L LE  FALLS:  Has patient fallen in last 6 months? No  LIVING ENVIRONMENT: Lives with: lives with their spouse Lives in: House/apartment Stairs: Yes: External: 3 steps; bilateral but cannot reach both Has following equipment at home: Walker - 4 wheeled and cryotherapy  OCCUPATION: retired  PLOF: Independent and Independent with basic ADLs  PATIENT GOALS: "to be relieved from the pain and be able to walk"  NEXT MD VISIT: 05/25/23  OBJECTIVE:  Note: Objective measures were completed at Evaluation unless otherwise noted.  DIAGNOSTIC FINDINGS: None performed relevant to the area recently  PATIENT SURVEYS:  LEFS to be determined next session  06/13/23 10/80; 12.5%  COGNITION: Overall cognitive status: Within functional limits for tasks assessed     SENSATION: Not tested   POSTURE: rounded shoulders and forward head    LOWER EXTREMITY MMT:   MMT Right eval Left eval  Hip flexion 4   Hip extension    Hip abduction 4   Hip adduction    Hip internal rotation    Hip external rotation    Knee flexion 4 4-  Knee extension 4 3+  Ankle dorsiflexion 4 4  Ankle plantarflexion 4 4  Ankle inversion    Ankle eversion      (Blank rows = not tested)  LOWER EXTREMITY ROM:  AROM Right eval Left eval  Hip flexion WFL 108  Hip extension    Hip abduction [facility]   Hip adduction    Hip internal rotation    Hip external rotation    Knee flexion WFL WFL, 134  Knee extension [facility]  Ankle dorsiflexion [facility] WFL  Ankle plantarflexion [facility] WFL  Ankle inversion    Ankle eversion     (Blank rows = not tested)  FUNCTIONAL TESTS:  2 minute walk test: to be determined  GAIT: Distance walked: ~ 50 ft Assistive device utilized: Walker - 4 wheeled Level of assistance: Modified independence Comments: modified 3-point gait, PWB 50% on L   TODAY'S TREATMENT:  DATE 07/04/2023  -Standing hip vectors in // bars on 2in box 5x5'' bilaterally with cues for upright/stabilized trunk 3 rounds- Neuromuscular re-education of stable lumbar spine w/ isolated hip movement and gluteal re-education for standing movement.  -Attempted 4in step ups with reduced compensation and lack of TKE- regressed to 2in stepup 2x12 -2in lateral stepups 2 x 12 -Standing LLE stool ER/IR 2' @ // bars -Standing heel raises x20 -Semi tandem stance 1 x 30 with LLE back  06/22/2023  -Recumbent bike x 5' -STM with crossfrictional massage along scar with jeans donned and STM along hip flexors, abductors and TLF. X 8 min -Supine bridge 2x10 for 5'' iso  -Hip adduction hooklying with pillow 5" hold x 10 -Hip abduction hooklying with belt 5" hold x 10 -Hooklying hip flexion isometric against hand 5'' x 10 -Quarter wall sit 5 x 10'' -TKE x 10 with RTB @ EOS walking 35ft without cane; antalgic through LLE  06/20/23 Supine: Moist heat to low back in hooklying position x 5' to decrease pain and improve soft tissue extensibility Transverse abdominus 5" hold x 10 Bridge x 10 Hip adduction hooklying with pillow 5" hold x 10 Hip abduction  hooklying with belt 5" hold x 10 Transverse abdominus with marching x 10 each    PATIENT EDUCATION:  Education details: Educated on the pathoanatomy of gluteus medius repair. Educated on the goals and course of rehab. Educated on precautions that needed to be observed. Education on proper gait pattern on level surface and on stairs Person educated: Patient Education method: Explanation, Demonstration, and Handouts Education comprehension: verbalized understanding and returned demonstration  HOME EXERCISE PROGRAM: 06/20/23 Transverse abdominus bracing 5"hold x 10 Bridge x 10   Access Code: [email or web address] URL: [email or web address] Date: 06/13/2023 Prepared by: [facility]  Exercises - Supine Heel Slide  - 2 x daily - 7 x weekly - 1 sets - 10 reps - Supine Quad Set  - 2 x daily - 7 x weekly - 1 sets - 10 reps - 5 sec hold - Supine Hip Adduction Isometric with Ball  - 2 x daily - 7 x weekly - 1 sets - 10 reps - 5 sec hold - Hooklying Isometric Hip Abduction with Belt  - 2 x daily - 7 x weekly - 1 sets - 10 reps - 5 sec hold  Access Code: [email or web address] URL: [email or web address] Date: 05/22/2023 Prepared by: Krystal Clark  Patient Education - Walking with a Standard Walker - Partial Weight Bearing Access Code: [email or web address] URL: [email or web address] Date: 06/22/2023 Prepared by: Starling Manns  Exercises - Hooklying Isometric Hip Flexion  - 1 x daily - 7 x weekly - 3 sets - 10 reps - 5 hold - Wall Quarter Squat  - 1 x daily - 7 x weekly - 3 sets - 10 reps - 10-15 hold - Standing Terminal Knee Extension with Resistance  - 1 x daily - 7 x weekly - 3 sets - 10 reps - 5 hold Access Code: [email or web address] URL: [email or web address] Date: 07/04/2023 Prepared by: Starling Manns  Exercises - Standing Romberg to 3/4 Tandem Stance  - 1 x daily - 7 x weekly - 1 sets - 10 reps - 30 hold - Heel Raises with Counter Support  - 1 x daily - 7 x weekly -  2 sets - 20 reps ASSESSMENT:  CLINICAL IMPRESSION: Pt tolerating session well today. Pt given and benefits from consistent cuing with new open chain hip strengthening. Reduced proprioceptive is noticed with activity this session. With  more noticeable antalgic gait pattern this session due to new irritation of hip. In weeks 6-10 of phase currently. Initiated stepups and standing ER/IR for increased ROM and tolerance. Overall tolerating challenges well. Continues with poor quadriceps strength noted in compensation of step training. Pt with poor compliance to HEP due to busy schedule with care-taking for mother and husband with current medical issues. Patient will benefit from continued skilled therapy services to address deficits and promote return to optimal function.       OBJECTIVE IMPAIRMENTS: Abnormal gait, decreased activity tolerance, decreased mobility, difficulty walking, decreased strength, and pain.   ACTIVITY LIMITATIONS: carrying, lifting, bending, sitting, standing, squatting, sleeping, stairs, and transfers  PARTICIPATION LIMITATIONS: meal prep, cleaning, laundry, driving, shopping, community activity, and yard work  PERSONAL FACTORS: 1 comorbidity: migraines  are also affecting patient's functional outcome.   REHAB POTENTIAL: Good  CLINICAL DECISION MAKING: Stable/uncomplicated  EVALUATION COMPLEXITY: Low   GOALS: Goals reviewed with patient? Yes  SHORT TERM GOALS: Target date: 07/03/23 Pt will demonstrate indep in HEP to facilitate carry-over of skilled services and improve functional outcomes Goal status: INITIAL  LONG TERM GOALS: Target date: 08/14/23  Pt will be able to ambulate independently > 200 ft on level surface Baseline: ambulatory with rollator Goal status: INITIAL  2.  Pt will be able to negotiate stairs with mod independence (use of railing)  Baseline: min assist Goal status: INITIAL  3.  Pt will demonstrate increase in LE strength to 4/5 to facilitate  ease and safety in ambulation Baseline: 3+/5 Goal status: INITIAL  4.  Pt will increase LEFS by at least 9 points in order to demonstrate significant improvement in lower extremity function. Baseline: to be determined Goal status: INITIAL   PLAN:  PT FREQUENCY: 2x/week  PT DURATION: 12 weeks  PLANNED INTERVENTIONS: 97164- PT Re-evaluation, 97110-Therapeutic exercises, 97530- Therapeutic activity, 97112- Neuromuscular re-education, 97535- Self Care, 40981- Manual therapy, [phone]- Gait training, and Patient/Family education  PLAN FOR NEXT SESSION:   LE strengthening and mobility per protocol.  Upright bike; TKEs,  Returns to surgeon 1/24   3:13 PM, 07/04/23 Nelida Meuse PT, DPT [facility] [place] [phone] office

## 2023-07-06 ENCOUNTER — Encounter (HOSPITAL_COMMUNITY): Payer: Self-pay

## 2023-07-06 ENCOUNTER — Ambulatory Visit (HOSPITAL_COMMUNITY): Payer: Medicare Other

## 2023-07-06 DIAGNOSIS — M25552 Pain in left hip: Secondary | ICD-10-CM | POA: Diagnosis not present

## 2023-07-06 DIAGNOSIS — R262 Difficulty in walking, not elsewhere classified: Secondary | ICD-10-CM

## 2023-07-06 NOTE — Therapy (Signed)
OUTPATIENT PHYSICAL THERAPY LOWER EXTREMITY TREATMENT   Patient Name: Sabrina Duke MRN: [id number] DOB:09-27-1955, 68 y.o., female Today's Date: 07/06/2023  END OF SESSION:  PT End of Session - 07/06/23 1512     Visit Number 6    Number of Visits 24    Date for PT Re-Evaluation 08/14/23    Authorization Type BCBS Medicare    Authorization Time Period [employer] approved 12 visits from 05/22/2023-11/17/2023 [phone]    Authorization - Visit Number 6    Authorization - Number of Visits 12    Progress Note Due on Visit 12    PT Start Time 1432    PT Stop Time 1512    PT Time Calculation (min) 40 min    Activity Tolerance Patient tolerated treatment well    Behavior During Therapy WFL for tasks assessed/performed               Past Medical History:  Diagnosis Date   Allergic rhinitis    Chronic kidney disease    Decreased bone density    Depression    GERD (gastroesophageal reflux disease)    Hypothyroid    Iron deficiency anemia    Low vitamin D level    Migraine    Osteoporosis    Past Surgical History:  Procedure Laterality Date   BIOPSY  03/10/2019   Procedure: BIOPSY;  Surgeon: Malissa Hippo, MD;  [facility]: [facility];  Service: Endoscopy;;  gastric    BIOPSY  03/24/2020   Procedure: BIOPSY;  Surgeon: Malissa Hippo, MD;  Location: [facility];  Service: Endoscopy;;  duodenum gastric   COLONOSCOPY WITH PROPOFOL N/A 03/24/2020   Procedure: COLONOSCOPY WITH PROPOFOL;  Surgeon: Malissa Hippo, MD;  [facility]: [facility];  Service: Endoscopy;  Laterality: N/A;  1225   ESOPHAGOGASTRODUODENOSCOPY N/A 03/10/2019   Procedure: ESOPHAGOGASTRODUODENOSCOPY (EGD);  Surgeon: Malissa Hippo, MD;  [facility]: [facility];  Service: Endoscopy;  Laterality: N/A;  2:40-rescheduled 10/5 @11 :55am per office   ESOPHAGOGASTRODUODENOSCOPY (EGD) WITH PROPOFOL N/A 03/24/2020   Procedure: ESOPHAGOGASTRODUODENOSCOPY (EGD) WITH PROPOFOL;  Surgeon: Malissa Hippo,  MD;  [facility]: [facility];  Service: Endoscopy;  Laterality: N/A;   GLUTEUS MINIMUS REPAIR Left 05/15/2023   Procedure: LEFT GLUTEUS MEDIUS  REPAIR;  Surgeon: Huel Cote, MD;  Location: [facility];  Service: Orthopedics;  Laterality: Left;   hemroidectomy  1990   OVARIAN CYST SURGERY  1972   SHOULDER SURGERY Left 11/2017   TUBAL LIGATION  1997   uterine polyp removal  05/2015   Patient Active Problem List   Diagnosis Date Noted   Tendinopathy of gluteus medius 05/15/2023   Loud snoring 04/20/2022   Uncontrolled morning headache 04/20/2022   Reactive depression 04/20/2022   Celiac disease 06/14/2020   Abdominal pain, left lower quadrant 06/14/2020   Constipation 06/14/2020   IBS (irritable bowel syndrome) 03/15/2020   Ileus (HCC) 01/16/2020   GERD (gastroesophageal reflux disease)    Hypothyroid    Abdominal pain, epigastric 02/03/2019   Nausea without vomiting 02/03/2019   Acid reflux 02/03/2019   Anxiety state 04/09/2018   Migraine, unspecified, intractable, with status migrainosus 04/09/2018   Insomnia 04/09/2018   Chronic migraine without aura without status migrainosus, not intractable 07/02/2017    PCP: Achilles Dunk, DO  REFERRING PROVIDER: Huel Cote, MD  REFERRING DIAG: [phone] (ICD-10-CM) - Tendinopathy of gluteus medius  THERAPY DIAG:  Pain in left hip  Difficulty in walking, not elsewhere classified  Rationale for Evaluation and Treatment: Rehabilitation  ONSET DATE: 05/15/23 S/P L gluteus medius repair  SUBJECTIVE:   SUBJECTIVE STATEMENT: Pt reporting sharp pain sitting feet flat in her recliner, reporting lateral aspect of hip. Currently no pain.   EVAL: Arrives to the clinic with L hip pain s/p L gluteal repair 05/15/23. Denies any drainage on the gauze. Patient has been having L hip pain for 2 years probably as result of her lifting a bag of clothes. Patient thought that she hurt her back. Patient was then given injections  on hip but it did not help. Patient was then referred to PT and had PT for around 3 months which did not help. Patient then had injections on the groin which did not help. Patient then had MRI which revealed torn labrum and gluteus medius. Patient then had L gluteus medius repair on 05/15/23 and was then referred to outpatient PT evaluation and management. Patient is 50% PWB on the L LE until next MD appointment  PERTINENT HISTORY: Migraines PAIN:  Are you having pain? Yes: NPRS scale: 9/10 Pain location: side of the L hip Pain description: aching, throbbing Aggravating factors: sitting with the legs propped, walking for around 5 minutes Relieving factors: laying with L LE propped  PRECAUTIONS: Other: follow rehab protocol  RED FLAGS: None   WEIGHT BEARING RESTRICTIONS: Yes 50% PWB on L LE  FALLS:  Has patient fallen in last 6 months? No  LIVING ENVIRONMENT: Lives with: lives with their spouse Lives in: House/apartment Stairs: Yes: External: 3 steps; bilateral but cannot reach both Has following equipment at home: Walker - 4 wheeled and cryotherapy  OCCUPATION: retired  PLOF: Independent and Independent with basic ADLs  PATIENT GOALS: "to be relieved from the pain and be able to walk"  NEXT MD VISIT: 05/25/23  OBJECTIVE:  Note: Objective measures were completed at Evaluation unless otherwise noted.  DIAGNOSTIC FINDINGS: None performed relevant to the area recently  PATIENT SURVEYS:  LEFS to be determined next session  06/13/23 10/80; 12.5%  COGNITION: Overall cognitive status: Within functional limits for tasks assessed     SENSATION: Not tested   POSTURE: rounded shoulders and forward head    LOWER EXTREMITY MMT:   MMT Right eval Left eval  Hip flexion 4   Hip extension    Hip abduction 4   Hip adduction    Hip internal rotation    Hip external rotation    Knee flexion 4 4-  Knee extension 4 3+  Ankle dorsiflexion 4 4  Ankle plantarflexion 4 4   Ankle inversion    Ankle eversion     (Blank rows = not tested)  LOWER EXTREMITY ROM:  AROM Right eval Left eval  Hip flexion WFL 108  Hip extension    Hip abduction [facility]   Hip adduction    Hip internal rotation    Hip external rotation    Knee flexion WFL WFL, 134  Knee extension [facility]  Ankle dorsiflexion [facility] WFL  Ankle plantarflexion [facility] WFL  Ankle inversion    Ankle eversion     (Blank rows = not tested)  FUNCTIONAL TESTS:  2 minute walk test: to be determined  GAIT: Distance walked: ~ 50 ft Assistive device utilized: Walker - 4 wheeled Level of assistance: Modified independence Comments: modified 3-point gait, PWB 50% on L   TODAY'S TREATMENT:  DATE 07/06/2023  -2in forward and lateral stepup with no UE support 2 x 12 with attempted single leg balance with R hip flexion- terminated due to pulling sensation. -Captain morgan pose on 6in box with LLE on ground- 10x10'' neuromuscular retraining with guided tactile cues for L gluteal isometric activation.  -Single Leg Standing hip abduction with RTB and @ HHA bilaterally x 15 for gluteal re-training of gluteus medius -Sit/stands with mirror for postural correction during squatting x10   07/04/2023  -Standing hip vectors in // bars on 2in box 5x5'' bilaterally with cues for upright/stabilized trunk 3 rounds- Neuromuscular re-education of stable lumbar spine w/ isolated hip movement and gluteal re-education for standing movement.  -Attempted 4in step ups with reduced compensation and lack of TKE- regressed to 2in stepup 2x12 -2in lateral stepups 2 x 12 -Standing LLE stool ER/IR 2' @ // bars -Standing heel raises x20 -Semi tandem stance 1 x 30 with LLE back  06/22/2023  -Recumbent bike x 5' -STM with crossfrictional massage along scar with jeans donned and STM along hip flexors, abductors and TLF.  X 8 min -Supine bridge 2x10 for 5'' iso  -Hip adduction hooklying with pillow 5" hold x 10 -Hip abduction hooklying with belt 5" hold x 10 -Hooklying hip flexion isometric against hand 5'' x 10 -Quarter wall sit 5 x 10'' -TKE x 10 with RTB @ EOS walking 80ft without cane; antalgic through LLE    PATIENT EDUCATION:  Education details: Educated on the pathoanatomy of gluteus medius repair. Educated on the goals and course of rehab. Educated on precautions that needed to be observed. Education on proper gait pattern on level surface and on stairs Person educated: Patient Education method: Explanation, Demonstration, and Handouts Education comprehension: verbalized understanding and returned demonstration  HOME EXERCISE PROGRAM: 06/20/23 Transverse abdominus bracing 5"hold x 10 Bridge x 10   Access Code: [email or web address] URL: [email or web address] Date: 06/13/2023 Prepared by: [facility]  Exercises - Supine Heel Slide  - 2 x daily - 7 x weekly - 1 sets - 10 reps - Supine Quad Set  - 2 x daily - 7 x weekly - 1 sets - 10 reps - 5 sec hold - Supine Hip Adduction Isometric with Ball  - 2 x daily - 7 x weekly - 1 sets - 10 reps - 5 sec hold - Hooklying Isometric Hip Abduction with Belt  - 2 x daily - 7 x weekly - 1 sets - 10 reps - 5 sec hold  Access Code: [email or web address] URL: [email or web address] Date: 05/22/2023 Prepared by: Krystal Clark  Patient Education - Walking with a Standard Walker - Partial Weight Bearing Access Code: [email or web address] URL: [email or web address] Date: 07/06/2023 Prepared by: Starling Manns  Exercises - Wall Quarter Squat  - 1 x daily - 7 x weekly - 3 sets - 10 reps - 10-15 hold - Standing Terminal Knee Extension with Resistance  - 1 x daily - 7 x weekly - 3 sets - 10 reps - 5 hold - Semi-Tandem Balance at Counter Top Eyes Open  - 1 x daily - 7 x weekly - 3 sets - 10 reps - Single Leg Stance  - 1 x daily - 7 x weekly - 3 sets -  10 reps - Standing Hip Abduction with Resistance at Ankles and Counter Support  - 1 x daily - 7 x weekly - 3 sets - 10 reps - Mini Squat with Counter Support  - 1 x daily - 7 x weekly - 3 sets -  10 reps  ASSESSMENT:  CLINICAL IMPRESSION: Pt tolerating therapy session well. Focused on progressing step training with more dynamic loading of L hip progressing to single leg activities. Retraining of L gluteal intervention showed easy fatigue this session with continuous cramping during activity. Pt with mild R weight shift during transfers which pt was able to self correct with visual cues. Pt's progress is slowed due to reduced HEP compliance and busy social factors outside clinic with caregiver for mother and pt's husband. Patient will benefit from continued skilled therapy services to address deficits and promote return to optimal function.       OBJECTIVE IMPAIRMENTS: Abnormal gait, decreased activity tolerance, decreased mobility, difficulty walking, decreased strength, and pain.   ACTIVITY LIMITATIONS: carrying, lifting, bending, sitting, standing, squatting, sleeping, stairs, and transfers  PARTICIPATION LIMITATIONS: meal prep, cleaning, laundry, driving, shopping, community activity, and yard work  PERSONAL FACTORS: 1 comorbidity: migraines  are also affecting patient's functional outcome.   REHAB POTENTIAL: Good  CLINICAL DECISION MAKING: Stable/uncomplicated  EVALUATION COMPLEXITY: Low   GOALS: Goals reviewed with patient? Yes  SHORT TERM GOALS: Target date: 07/03/23 Pt will demonstrate indep in HEP to facilitate carry-over of skilled services and improve functional outcomes Goal status: INITIAL  LONG TERM GOALS: Target date: 08/14/23  Pt will be able to ambulate independently > 200 ft on level surface Baseline: ambulatory with rollator Goal status: INITIAL  2.  Pt will be able to negotiate stairs with mod independence (use of railing)  Baseline: min assist Goal status:  INITIAL  3.  Pt will demonstrate increase in LE strength to 4/5 to facilitate ease and safety in ambulation Baseline: 3+/5 Goal status: INITIAL  4.  Pt will increase LEFS by at least 9 points in order to demonstrate significant improvement in lower extremity function. Baseline: to be determined Goal status: INITIAL   PLAN:  PT FREQUENCY: 2x/week  PT DURATION: 12 weeks  PLANNED INTERVENTIONS: 97164- PT Re-evaluation, 97110-Therapeutic exercises, 97530- Therapeutic activity, 97112- Neuromuscular re-education, 97535- Self Care, 16109- Manual therapy, [phone]- Gait training, and Patient/Family education  PLAN FOR NEXT SESSION:   LE strengthening and mobility per protocol.  Upright bike; TKEs,  Returns to surgeon 1/24   3:13 PM, 07/06/23 Nelida Meuse PT, DPT [facility] [place] [phone] office

## 2023-07-09 ENCOUNTER — Encounter (HOSPITAL_COMMUNITY): Payer: Self-pay

## 2023-07-09 ENCOUNTER — Ambulatory Visit (HOSPITAL_COMMUNITY): Payer: Medicare Other | Attending: Orthopaedic Surgery

## 2023-07-09 DIAGNOSIS — M25552 Pain in left hip: Secondary | ICD-10-CM | POA: Insufficient documentation

## 2023-07-09 DIAGNOSIS — R262 Difficulty in walking, not elsewhere classified: Secondary | ICD-10-CM | POA: Insufficient documentation

## 2023-07-09 NOTE — Therapy (Signed)
OUTPATIENT PHYSICAL THERAPY LOWER EXTREMITY TREATMENT   Patient Name: Sabrina Duke MRN: [id number] DOB:04-06-1956, 68 y.o., female Today's Date: 07/09/2023  END OF SESSION:  PT End of Session - 07/09/23 1430     Visit Number 7    Number of Visits 24    Date for PT Re-Evaluation 08/14/23    Authorization Type BCBS Medicare    Authorization Time Period [employer] approved 12 visits from 05/22/2023-11/17/2023 [phone]    Authorization - Visit Number 8    Authorization - Number of Visits 12    Progress Note Due on Visit 12    PT Start Time 1347    PT Stop Time 1430    PT Time Calculation (min) 43 min    Activity Tolerance Patient tolerated treatment well    Behavior During Therapy WFL for tasks assessed/performed                Past Medical History:  Diagnosis Date   Allergic rhinitis    Chronic kidney disease    Decreased bone density    Depression    GERD (gastroesophageal reflux disease)    Hypothyroid    Iron deficiency anemia    Low vitamin D level    Migraine    Osteoporosis    Past Surgical History:  Procedure Laterality Date   BIOPSY  03/10/2019   Procedure: BIOPSY;  Surgeon: Malissa Hippo, MD;  [facility]: [facility];  Service: Endoscopy;;  gastric    BIOPSY  03/24/2020   Procedure: BIOPSY;  Surgeon: Malissa Hippo, MD;  Location: [facility];  Service: Endoscopy;;  duodenum gastric   COLONOSCOPY WITH PROPOFOL N/A 03/24/2020   Procedure: COLONOSCOPY WITH PROPOFOL;  Surgeon: Malissa Hippo, MD;  [facility]: [facility];  Service: Endoscopy;  Laterality: N/A;  1225   ESOPHAGOGASTRODUODENOSCOPY N/A 03/10/2019   Procedure: ESOPHAGOGASTRODUODENOSCOPY (EGD);  Surgeon: Malissa Hippo, MD;  Location: [facility];  Service: Endoscopy;  Laterality: N/A;  2:40-rescheduled 10/5 @11 :55am per office   ESOPHAGOGASTRODUODENOSCOPY (EGD) WITH PROPOFOL N/A 03/24/2020   Procedure: ESOPHAGOGASTRODUODENOSCOPY (EGD) WITH PROPOFOL;  Surgeon: Malissa Hippo,  MD;  Location: [facility];  Service: Endoscopy;  Laterality: N/A;   GLUTEUS MINIMUS REPAIR Left 05/15/2023   Procedure: LEFT GLUTEUS MEDIUS  REPAIR;  Surgeon: Huel Cote, MD;  Location: [facility];  Service: Orthopedics;  Laterality: Left;   hemroidectomy  1990   OVARIAN CYST SURGERY  1972   SHOULDER SURGERY Left 11/2017   TUBAL LIGATION  1997   uterine polyp removal  05/2015   Patient Active Problem List   Diagnosis Date Noted   Tendinopathy of gluteus medius 05/15/2023   Loud snoring 04/20/2022   Uncontrolled morning headache 04/20/2022   Reactive depression 04/20/2022   Celiac disease 06/14/2020   Abdominal pain, left lower quadrant 06/14/2020   Constipation 06/14/2020   IBS (irritable bowel syndrome) 03/15/2020   Ileus (HCC) 01/16/2020   GERD (gastroesophageal reflux disease)    Hypothyroid    Abdominal pain, epigastric 02/03/2019   Nausea without vomiting 02/03/2019   Acid reflux 02/03/2019   Anxiety state 04/09/2018   Migraine, unspecified, intractable, with status migrainosus 04/09/2018   Insomnia 04/09/2018   Chronic migraine without aura without status migrainosus, not intractable 07/02/2017    PCP: Achilles Dunk, DO  REFERRING PROVIDER: Huel Cote, MD  REFERRING DIAG: [phone] (ICD-10-CM) - Tendinopathy of gluteus medius  THERAPY DIAG:  Pain in left hip  Difficulty in walking, not elsewhere  classified  Rationale for Evaluation and Treatment: Rehabilitation  ONSET DATE: 05/15/23 S/P L gluteus medius repair  SUBJECTIVE:   SUBJECTIVE STATEMENT: Pt reporting no pain today. Reports difficulty with single leg stance activity.   EVAL: Arrives to the clinic with L hip pain s/p L gluteal repair 05/15/23. Denies any drainage on the gauze. Patient has been having L hip pain for 2 years probably as result of her lifting a bag of clothes. Patient thought that she hurt her back. Patient was then given injections on hip but it did not help.  Patient was then referred to PT and had PT for around 3 months which did not help. Patient then had injections on the groin which did not help. Patient then had MRI which revealed torn labrum and gluteus medius. Patient then had L gluteus medius repair on 05/15/23 and was then referred to outpatient PT evaluation and management. Patient is 50% PWB on the L LE until next MD appointment  PERTINENT HISTORY: Migraines PAIN:  Are you having pain? Yes: NPRS scale: 9/10 Pain location: side of the L hip Pain description: aching, throbbing Aggravating factors: sitting with the legs propped, walking for around 5 minutes Relieving factors: laying with L LE propped  PRECAUTIONS: Other: follow rehab protocol  RED FLAGS: None   WEIGHT BEARING RESTRICTIONS: Yes 50% PWB on L LE  FALLS:  Has patient fallen in last 6 months? No  LIVING ENVIRONMENT: Lives with: lives with their spouse Lives in: House/apartment Stairs: Yes: External: 3 steps; bilateral but cannot reach both Has following equipment at home: Walker - 4 wheeled and cryotherapy  OCCUPATION: retired  PLOF: Independent and Independent with basic ADLs  PATIENT GOALS: "to be relieved from the pain and be able to walk"  NEXT MD VISIT: 05/25/23  OBJECTIVE:  Note: Objective measures were completed at Evaluation unless otherwise noted.  DIAGNOSTIC FINDINGS: None performed relevant to the area recently  PATIENT SURVEYS:  LEFS to be determined next session  06/13/23 10/80; 12.5%  COGNITION: Overall cognitive status: Within functional limits for tasks assessed     SENSATION: Not tested   POSTURE: rounded shoulders and forward head    LOWER EXTREMITY MMT:   MMT Right eval Left eval  Hip flexion 4   Hip extension    Hip abduction 4   Hip adduction    Hip internal rotation    Hip external rotation    Knee flexion 4 4-  Knee extension 4 3+  Ankle dorsiflexion 4 4  Ankle plantarflexion 4 4  Ankle inversion    Ankle  eversion     (Blank rows = not tested)  LOWER EXTREMITY ROM:  AROM Right eval Left eval  Hip flexion WFL 108  Hip extension    Hip abduction [facility]   Hip adduction    Hip internal rotation    Hip external rotation    Knee flexion WFL WFL, 134  Knee extension [facility]  Ankle dorsiflexion [facility] WFL  Ankle plantarflexion [facility] WFL  Ankle inversion    Ankle eversion     (Blank rows = not tested)  FUNCTIONAL TESTS:  2 minute walk test: to be determined  GAIT: Distance walked: ~ 50 ft Assistive device utilized: Walker - 4 wheeled Level of assistance: Modified independence Comments: modified 3-point gait, PWB 50% on L   TODAY'S TREATMENT:  DATE 07/09/2023  -SLS @ // bars 4 x 30'' bilaterally -Standing knee flexion w/ 2lb ankle weight 2 x 15 @ // bars -4in forward step-ups with 2lb ankle weight x 20 with no UE support -4in lateral step-ups with 2lb ankle weight x 20 with no UE support -Gait analysis for 240ft- pt ambulating with mild antalgic pattern through LLE with reduced step length and midfoot contact during IC rather that heel. Pt cued for increased Heel IC which reduced antalgia but then demonstrated poor UB coordination with LB swinging. Cued for normal gait patten with larger step pattern and more heel throughout cycle.  -Standing heel raise on decline x 20 -Squat training/practicing x 10 with cues for posterior hip hinge   07/06/2023  -2in forward and lateral stepup with no UE support 2 x 12 with attempted single leg balance with R hip flexion- terminated due to pulling sensation. -Captain morgan pose on 6in box with LLE on ground- 10x10'' neuromuscular retraining with guided tactile cues for L gluteal isometric activation.  -Single Leg Standing hip abduction with RTB and @ HHA bilaterally x 15 for gluteal re-training of gluteus medius -Sit/stands with  mirror for postural correction during squatting x10   07/04/2023  -Standing hip vectors in // bars on 2in box 5x5'' bilaterally with cues for upright/stabilized trunk 3 rounds- Neuromuscular re-education of stable lumbar spine w/ isolated hip movement and gluteal re-education for standing movement.  -Attempted 4in step ups with reduced compensation and lack of TKE- regressed to 2in stepup 2x12 -2in lateral stepups 2 x 12 -Standing LLE stool ER/IR 2' @ // bars -Standing heel raises x20 -Semi tandem stance 1 x 30 with LLE back   PATIENT EDUCATION:  Education details: Educated on the pathoanatomy of gluteus medius repair. Educated on the goals and course of rehab. Educated on precautions that needed to be observed. Education on proper gait pattern on level surface and on stairs Person educated: Patient Education method: Explanation, Demonstration, and Handouts Education comprehension: verbalized understanding and returned demonstration  HOME EXERCISE PROGRAM: 06/20/23 Transverse abdominus bracing 5"hold x 10 Bridge x 10   Access Code: [email or web address] URL: [email or web address] Date: 06/13/2023 Prepared by: [facility]  Exercises - Supine Heel Slide  - 2 x daily - 7 x weekly - 1 sets - 10 reps - Supine Quad Set  - 2 x daily - 7 x weekly - 1 sets - 10 reps - 5 sec hold - Supine Hip Adduction Isometric with Ball  - 2 x daily - 7 x weekly - 1 sets - 10 reps - 5 sec hold - Hooklying Isometric Hip Abduction with Belt  - 2 x daily - 7 x weekly - 1 sets - 10 reps - 5 sec hold  Access Code: [email or web address] URL: [email or web address] Date: 05/22/2023 Prepared by: Krystal Clark  Patient Education - Walking with a Standard Walker - Partial Weight Bearing Access Code: [email or web address] URL: [email or web address] Date: 07/06/2023 Prepared by: Starling Manns  Exercises - Wall Quarter Squat  - 1 x daily - 7 x weekly - 3 sets - 10 reps - 10-15 hold - Standing Terminal  Knee Extension with Resistance  - 1 x daily - 7 x weekly - 3 sets - 10 reps - 5 hold - Semi-Tandem Balance at Counter Top Eyes Open  - 1 x daily - 7 x weekly - 3 sets - 10 reps - Single Leg Stance  - 1 x daily - 7 x weekly - 3 sets - 10 reps - Standing  Hip Abduction with Resistance at Ankles and Counter Support  - 1 x daily - 7 x weekly - 3 sets - 10 reps - Mini Squat with Counter Support  - 1 x daily - 7 x weekly - 3 sets - 10 reps  ASSESSMENT:  CLINICAL IMPRESSION: Pt tolerating therapy session well. Session focused on continued ADL, transfer training, and improving gait cycle without AD. Pt shows antalgia with poor swing and heel strike but benefited from cuing and improved form followed. Pt showing improvements from over the weekend with stair training. Pt shows poor motor coordination during stair training and squat training with poor proprioception during this activities. Encouraged to reduce cane usage more this week and walk out of session without using cane. Patient will benefit from continued skilled therapy services to address deficits and promote return to optimal function.       OBJECTIVE IMPAIRMENTS: Abnormal gait, decreased activity tolerance, decreased mobility, difficulty walking, decreased strength, and pain.   ACTIVITY LIMITATIONS: carrying, lifting, bending, sitting, standing, squatting, sleeping, stairs, and transfers  PARTICIPATION LIMITATIONS: meal prep, cleaning, laundry, driving, shopping, community activity, and yard work  PERSONAL FACTORS: 1 comorbidity: migraines  are also affecting patient's functional outcome.   REHAB POTENTIAL: Good  CLINICAL DECISION MAKING: Stable/uncomplicated  EVALUATION COMPLEXITY: Low   GOALS: Goals reviewed with patient? Yes  SHORT TERM GOALS: Target date: 07/03/23 Pt will demonstrate indep in HEP to facilitate carry-over of skilled services and improve functional outcomes Goal status: INITIAL  LONG TERM GOALS: Target date:  08/14/23  Pt will be able to ambulate independently > 200 ft on level surface Baseline: ambulatory with rollator Goal status: INITIAL  2.  Pt will be able to negotiate stairs with mod independence (use of railing)  Baseline: min assist Goal status: INITIAL  3.  Pt will demonstrate increase in LE strength to 4/5 to facilitate ease and safety in ambulation Baseline: 3+/5 Goal status: INITIAL  4.  Pt will increase LEFS by at least 9 points in order to demonstrate significant improvement in lower extremity function. Baseline: to be determined Goal status: INITIAL   PLAN:  PT FREQUENCY: 2x/week  PT DURATION: 12 weeks  PLANNED INTERVENTIONS: 97164- PT Re-evaluation, 97110-Therapeutic exercises, 97530- Therapeutic activity, 97112- Neuromuscular re-education, 97535- Self Care, 16109- Manual therapy, [phone]- Gait training, and Patient/Family education  PLAN FOR NEXT SESSION:   LE strengthening and mobility per protocol.  Upright bike; TKEs,  Returns to surgeon 1/24   2:32 PM, 07/09/23 Nelida Meuse PT, DPT [facility] [place] 336 [phone] office

## 2023-07-12 ENCOUNTER — Ambulatory Visit (HOSPITAL_COMMUNITY): Payer: Medicare Other

## 2023-07-12 DIAGNOSIS — M25552 Pain in left hip: Secondary | ICD-10-CM | POA: Diagnosis not present

## 2023-07-12 DIAGNOSIS — R262 Difficulty in walking, not elsewhere classified: Secondary | ICD-10-CM

## 2023-07-12 NOTE — Therapy (Signed)
 OUTPATIENT PHYSICAL THERAPY LOWER EXTREMITY TREATMENT   Patient Name: Sabrina Duke MRN: [id number] DOB:[birth year], 68 y.o., female Today's Date: 07/12/2023  END OF SESSION:  PT End of Session - 07/12/23 1303     Visit Number 8    Number of Visits 24    Date for PT Re-Evaluation 08/14/23    Authorization Type BCBS Medicare    Authorization Time Period [employer] approved 12 visits from 05/22/2023-11/17/2023 [phone]    Authorization - Visit Number 9    Authorization - Number of Visits 12    Progress Note Due on Visit 12    PT Start Time 1303    PT Stop Time 1344    PT Time Calculation (min) 41 min    Activity Tolerance Patient tolerated treatment well    Behavior During Therapy WFL for tasks assessed/performed                Past Medical History:  Diagnosis Date   Allergic rhinitis    Chronic kidney disease    Decreased bone density    Depression    GERD (gastroesophageal reflux disease)    Hypothyroid    Iron deficiency anemia    Low vitamin D level    Migraine    Osteoporosis    Past Surgical History:  Procedure Laterality Date   BIOPSY  03/10/2019   Procedure: BIOPSY;  Surgeon: Golda Claudis PENNER, MD;  [facility]: [facility];  Service: Endoscopy;;  gastric    BIOPSY  03/24/2020   Procedure: BIOPSY;  Surgeon: Golda Claudis PENNER, MD;  Location: [facility];  Service: Endoscopy;;  duodenum gastric   COLONOSCOPY WITH PROPOFOL  N/A 03/24/2020   Procedure: COLONOSCOPY WITH PROPOFOL ;  Surgeon: Golda Claudis PENNER, MD;  [facility]: [facility];  Service: Endoscopy;  Laterality: N/A;  1225   ESOPHAGOGASTRODUODENOSCOPY N/A 03/10/2019   Procedure: ESOPHAGOGASTRODUODENOSCOPY (EGD);  Surgeon: Golda Claudis PENNER, MD;  Location: [facility];  Service: Endoscopy;  Laterality: N/A;  2:40-rescheduled 10/5 @11 :55am per office   ESOPHAGOGASTRODUODENOSCOPY (EGD) WITH PROPOFOL  N/A 03/24/2020   Procedure: ESOPHAGOGASTRODUODENOSCOPY (EGD) WITH PROPOFOL ;  Surgeon: Golda Claudis PENNER,  MD;  Location: [facility];  Service: Endoscopy;  Laterality: N/A;   GLUTEUS MINIMUS REPAIR Left 05/15/2023   Procedure: LEFT GLUTEUS MEDIUS  REPAIR;  Surgeon: Genelle Standing, MD;  Location: [facility];  Service: Orthopedics;  Laterality: Left;   hemroidectomy  1990   OVARIAN CYST SURGERY  1972   SHOULDER SURGERY Left 11/2017   TUBAL LIGATION  1997   uterine polyp removal  05/2015   Patient Active Problem List   Diagnosis Date Noted   Tendinopathy of gluteus medius 05/15/2023   Loud snoring 04/20/2022   Uncontrolled morning headache 04/20/2022   Reactive depression 04/20/2022   Celiac disease 06/14/2020   Abdominal pain, left lower quadrant 06/14/2020   Constipation 06/14/2020   IBS (irritable bowel syndrome) 03/15/2020   Ileus (HCC) 01/16/2020   GERD (gastroesophageal reflux disease)    Hypothyroid    Abdominal pain, epigastric 02/03/2019   Nausea without vomiting 02/03/2019   Acid reflux 02/03/2019   Anxiety state 04/09/2018   Migraine, unspecified, intractable, with status migrainosus 04/09/2018   Insomnia 04/09/2018   Chronic migraine without aura without status migrainosus, not intractable 07/02/2017    PCP: Fernand Charolett PEDLAR, DO  REFERRING PROVIDER: Genelle Standing, MD  REFERRING DIAG: [phone] (ICD-10-CM) - Tendinopathy of gluteus medius  THERAPY DIAG:  Pain in left hip  Difficulty in walking, not elsewhere  classified  Rationale for Evaluation and Treatment: Rehabilitation  ONSET DATE: 05/15/23 S/P L gluteus medius repair  SUBJECTIVE:   SUBJECTIVE STATEMENT: Increased pain and soreness after going shopping yesterday without cane and wearing small healed booties.    EVAL: Arrives to the clinic with L hip pain s/p L gluteal repair 05/15/23. Denies any drainage on the gauze. Patient has been having L hip pain for 2 years probably as result of her lifting a bag of clothes. Patient thought that she hurt her back. Patient was then given injections on  hip but it did not help. Patient was then referred to PT and had PT for around 3 months which did not help. Patient then had injections on the groin which did not help. Patient then had MRI which revealed torn labrum and gluteus medius. Patient then had L gluteus medius repair on 05/15/23 and was then referred to outpatient PT evaluation and management. Patient is 50% PWB on the L LE until next MD appointment  PERTINENT HISTORY: Migraines PAIN:  Are you having pain? Yes: NPRS scale: 9/10 Pain location: side of the L hip Pain description: aching, throbbing Aggravating factors: sitting with the legs propped, walking for around 5 minutes Relieving factors: laying with L LE propped  PRECAUTIONS: Other: follow rehab protocol  RED FLAGS: None   WEIGHT BEARING RESTRICTIONS: Yes 50% PWB on L LE  FALLS:  Has patient fallen in last 6 months? No  LIVING ENVIRONMENT: Lives with: lives with their spouse Lives in: House/apartment Stairs: Yes: External: 3 steps; bilateral but cannot reach both Has following equipment at home: Walker - 4 wheeled and cryotherapy  OCCUPATION: retired  PLOF: Independent and Independent with basic ADLs  PATIENT GOALS: to be relieved from the pain and be able to walk  NEXT MD VISIT: 05/25/23  OBJECTIVE:  Note: Objective measures were completed at Evaluation unless otherwise noted.  DIAGNOSTIC FINDINGS: None performed relevant to the area recently  PATIENT SURVEYS:  LEFS to be determined next session  06/13/23 10/80; 12.5%  COGNITION: Overall cognitive status: Within functional limits for tasks assessed     SENSATION: Not tested   POSTURE: rounded shoulders and forward head    LOWER EXTREMITY MMT:   MMT Right eval Left eval  Hip flexion 4   Hip extension    Hip abduction 4   Hip adduction    Hip internal rotation    Hip external rotation    Knee flexion 4 4-  Knee extension 4 3+  Ankle dorsiflexion 4 4  Ankle plantarflexion 4 4  Ankle  inversion    Ankle eversion     (Blank rows = not tested)  LOWER EXTREMITY ROM:  AROM Right eval Left eval  Hip flexion WFL 108  Hip extension    Hip abduction [facility]   Hip adduction    Hip internal rotation    Hip external rotation    Knee flexion WFL WFL, 134  Knee extension [facility]  Ankle dorsiflexion [facility] WFL  Ankle plantarflexion [facility] WFL  Ankle inversion    Ankle eversion     (Blank rows = not tested)  FUNCTIONAL TESTS:  2 minute walk test: to be determined  GAIT: Distance walked: ~ 50 ft Assistive device utilized: Walker - 4 wheeled Level of assistance: Modified independence Comments: modified 3-point gait, PWB 50% on L   TODAY'S TREATMENT:  DATE 07/12/23 Nustep seat 7 x 5' dynamic warm up Standing: Heel raises 2 x 10 Slant board 5  x 20 [employer] leg press 2 plates 2 x 10 Hip extension 2 x 10 each GTB TKE's x 20 DL squat with PVC pole using wall as cue to bump glutes 2 x 10 Standing gentle hip flexor stretch against the wall 5 x 20   07/09/2023  -SLS @ // bars 4 x 30'' bilaterally -Standing knee flexion w/ 2lb ankle weight 2 x 15 @ // bars -4in forward step-ups with 2lb ankle weight x 20 with no UE support -4in lateral step-ups with 2lb ankle weight x 20 with no UE support -Gait analysis for 272ft- pt ambulating with mild antalgic pattern through LLE with reduced step length and midfoot contact during IC rather that heel. Pt cued for increased Heel IC which reduced antalgia but then demonstrated poor UB coordination with LB swinging. Cued for normal gait patten with larger step pattern and more heel throughout cycle.  -Standing heel raise on decline x 20 -Squat training/practicing x 10 with cues for posterior hip hinge   07/06/2023  -2in forward and lateral stepup with no UE support 2 x 12 with attempted single leg balance with R hip  flexion- terminated due to pulling sensation. -Captain morgan pose on 6in box with LLE on ground- 10x10'' neuromuscular retraining with guided tactile cues for L gluteal isometric activation.  -Single Leg Standing hip abduction with RTB and @ HHA bilaterally x 15 for gluteal re-training of gluteus medius -Sit/stands with mirror for postural correction during squatting x10   07/04/2023  -Standing hip vectors in // bars on 2in box 5x5'' bilaterally with cues for upright/stabilized trunk 3 rounds- Neuromuscular re-education of stable lumbar spine w/ isolated hip movement and gluteal re-education for standing movement.  -Attempted 4in step ups with reduced compensation and lack of TKE- regressed to 2in stepup 2x12 -2in lateral stepups 2 x 12 -Standing LLE stool ER/IR 2' @ // bars -Standing heel raises x20 -Semi tandem stance 1 x 30 with LLE back   PATIENT EDUCATION:  Education details: Educated on the pathoanatomy of gluteus medius repair. Educated on the goals and course of rehab. Educated on precautions that needed to be observed. Education on proper gait pattern on level surface and on stairs Person educated: Patient Education method: Explanation, Demonstration, and Handouts Education comprehension: verbalized understanding and returned demonstration  HOME EXERCISE PROGRAM: 06/20/23 Transverse abdominus bracing 5hold x 10 Bridge x 10   Access Code: [email or web address] URL: [email or web address] Date: 06/13/2023 Prepared by: [facility]  Exercises - Supine Heel Slide  - 2 x daily - 7 x weekly - 1 sets - 10 reps - Supine Quad Set  - 2 x daily - 7 x weekly - 1 sets - 10 reps - 5 sec hold - Supine Hip Adduction Isometric with Ball  - 2 x daily - 7 x weekly - 1 sets - 10 reps - 5 sec hold - Hooklying Isometric Hip Abduction with Belt  - 2 x daily - 7 x weekly - 1 sets - 10 reps - 5 sec hold  Access Code: [email or web address] URL: [email or web address] Date: 05/22/2023 Prepared  by: Vinie Haver  Patient Education - Walking with a Standard Walker - Partial Weight Bearing Access Code: [email or web address] URL: [email or web address] Date: 07/06/2023 Prepared by: Omega Bottcher  Exercises - Wall Quarter Squat  - 1 x daily - 7 x weekly - 3 sets - 10 reps - 10-15 hold - Standing  Terminal Knee Extension with Resistance  - 1 x daily - 7 x weekly - 3 sets - 10 reps - 5 hold - Semi-Tandem Balance at [employer] Eyes Open  - 1 x daily - 7 x weekly - 3 sets - 10 reps - Single Leg Stance  - 1 x daily - 7 x weekly - 3 sets - 10 reps - Standing Hip Abduction with Resistance at Ankles and Counter Support  - 1 x daily - 7 x weekly - 3 sets - 10 reps - Mini Squat with Counter Support  - 1 x daily - 7 x weekly - 3 sets - 10 reps  ASSESSMENT:  CLINICAL IMPRESSION: Increased limp noted today; instructed patient to slow pace in order to improve form; also discussed with her using the cane if needed if she is having increased pain. Started off with Nustep for dynamic warm up. No pain while sitting.  Added leg press today.  Patient with difficulty coordinating squat so had her do that with wall to cue correct posterior movement of pelvis with squat by tapping wall with glutes.    Patient will benefit from continued skilled therapy services to address deficits and promote return to optimal function.       OBJECTIVE IMPAIRMENTS: Abnormal gait, decreased activity tolerance, decreased mobility, difficulty walking, decreased strength, and pain.   ACTIVITY LIMITATIONS: carrying, lifting, bending, sitting, standing, squatting, sleeping, stairs, and transfers  PARTICIPATION LIMITATIONS: meal prep, cleaning, laundry, driving, shopping, community activity, and yard work  PERSONAL FACTORS: 1 comorbidity: migraines  are also affecting patient's functional outcome.   REHAB POTENTIAL: Good  CLINICAL DECISION MAKING: Stable/uncomplicated  EVALUATION COMPLEXITY: Low   GOALS: Goals  reviewed with patient? Yes  SHORT TERM GOALS: Target date: 07/03/23 Pt will demonstrate indep in HEP to facilitate carry-over of skilled services and improve functional outcomes Goal status: INITIAL  LONG TERM GOALS: Target date: 08/14/23  Pt will be able to ambulate independently > 200 ft on level surface Baseline: ambulatory with rollator Goal status: INITIAL  2.  Pt will be able to negotiate stairs with mod independence (use of railing)  Baseline: min assist Goal status: INITIAL  3.  Pt will demonstrate increase in LE strength to 4/5 to facilitate ease and safety in ambulation Baseline: 3+/5 Goal status: INITIAL  4.  Pt will increase LEFS by at least 9 points in order to demonstrate significant improvement in lower extremity function. Baseline: to be determined Goal status: INITIAL   PLAN:  PT FREQUENCY: 2x/week  PT DURATION: 12 weeks  PLANNED INTERVENTIONS: 97164- PT Re-evaluation, 97110-Therapeutic exercises, 97530- Therapeutic activity, 97112- Neuromuscular re-education, 97535- Self Care, [id number]- Manual therapy, [phone]- Gait training, and Patient/Family education  PLAN FOR NEXT SESSION:   LE strengthening and mobility per protocol.  Upright bike; TKEs,  Returns to surgeon 1/24  1:46 PM, 07/12/23 Ashika Apuzzo Small Lakela Kuba MPT [facility] [place] [phone]

## 2023-07-16 ENCOUNTER — Encounter (HOSPITAL_COMMUNITY): Payer: Medicare Other

## 2023-07-17 ENCOUNTER — Other Ambulatory Visit: Payer: Self-pay

## 2023-07-17 DIAGNOSIS — F419 Anxiety disorder, unspecified: Secondary | ICD-10-CM

## 2023-07-17 MED ORDER — LORAZEPAM 0.5 MG PO TABS: ORAL_TABLET | ORAL | 0 refills | Status: AC

## 2023-07-18 ENCOUNTER — Encounter (HOSPITAL_COMMUNITY): Payer: Medicare Other

## 2023-07-23 ENCOUNTER — Ambulatory Visit (HOSPITAL_COMMUNITY): Payer: Medicare Other

## 2023-07-23 DIAGNOSIS — M25552 Pain in left hip: Secondary | ICD-10-CM | POA: Diagnosis not present

## 2023-07-23 DIAGNOSIS — R262 Difficulty in walking, not elsewhere classified: Secondary | ICD-10-CM

## 2023-07-23 NOTE — Therapy (Signed)
OUTPATIENT PHYSICAL THERAPY LOWER EXTREMITY TREATMENT/DISCHARGE PHYSICAL THERAPY DISCHARGE SUMMARY  Visits from Start of Care: 9  Current functional level related to goals / functional outcomes: See below   Remaining deficits: See below   Education / Equipment: HEP   Patient agrees to discharge. Patient goals were met. Patient is being discharged due to meeting the stated rehab goals.    Patient Name: Sabrina Duke MRN: [id number] DOB:1955-11-19, 68 y.o., female Today's Date: 07/23/2023  END OF SESSION:  PT End of Session - 07/23/23 1301     Visit Number 9    Number of Visits 24    Date for PT Re-Evaluation 08/14/23    Authorization Type BCBS Medicare    Authorization Time Period [employer] approved 12 visits from 05/22/2023-11/17/2023 [phone]    Authorization - Visit Number 9    Authorization - Number of Visits 12    Progress Note Due on Visit 12    PT Start Time 1302    PT Stop Time 1342    PT Time Calculation (min) 40 min    Activity Tolerance Patient tolerated treatment well    Behavior During Therapy WFL for tasks assessed/performed                Past Medical History:  Diagnosis Date   Allergic rhinitis    Chronic kidney disease    Decreased bone density    Depression    GERD (gastroesophageal reflux disease)    Hypothyroid    Iron deficiency anemia    Low vitamin D level    Migraine    Osteoporosis    Past Surgical History:  Procedure Laterality Date   BIOPSY  03/10/2019   Procedure: BIOPSY;  Surgeon: Malissa Hippo, MD;  [facility];  Service: Endoscopy;;  gastric    BIOPSY  03/24/2020   Procedure: BIOPSY;  Surgeon: Malissa Hippo, MD;  [facility];  Service: Endoscopy;;  duodenum gastric   COLONOSCOPY WITH PROPOFOL N/A 03/24/2020   Procedure: COLONOSCOPY WITH PROPOFOL;  Surgeon: Malissa Hippo, MD;  [facility];  Service: Endoscopy;  Laterality: N/A;  1225   ESOPHAGOGASTRODUODENOSCOPY N/A  03/10/2019   Procedure: ESOPHAGOGASTRODUODENOSCOPY (EGD);  Surgeon: Malissa Hippo, MD;  [facility];  Service: Endoscopy;  Laterality: N/A;  2:40-rescheduled 10/5 @11 :55am per office   ESOPHAGOGASTRODUODENOSCOPY (EGD) WITH PROPOFOL N/A 03/24/2020   Procedure: ESOPHAGOGASTRODUODENOSCOPY (EGD) WITH PROPOFOL;  Surgeon: Malissa Hippo, MD;  [facility];  Service: Endoscopy;  Laterality: N/A;   GLUTEUS MINIMUS REPAIR Left 05/15/2023   Procedure: LEFT GLUTEUS MEDIUS  REPAIR;  Surgeon: Huel Cote, MD;  Location: [facility];  Service: Orthopedics;  Laterality: Left;   hemroidectomy  1990   OVARIAN CYST SURGERY  1972   SHOULDER SURGERY Left 11/2017   TUBAL LIGATION  1997   uterine polyp removal  05/2015   Patient Active Problem List   Diagnosis Date Noted   Tendinopathy of gluteus medius 05/15/2023   Loud snoring 04/20/2022   Uncontrolled morning headache 04/20/2022   Reactive depression 04/20/2022   Celiac disease 06/14/2020   Abdominal pain, left lower quadrant 06/14/2020   Constipation 06/14/2020   IBS (irritable bowel syndrome) 03/15/2020   Ileus (HCC) 01/16/2020   GERD (gastroesophageal reflux disease)    Hypothyroid    Abdominal pain, epigastric 02/03/2019   Nausea without vomiting 02/03/2019   Acid reflux 02/03/2019   Anxiety state 04/09/2018   Migraine, unspecified, intractable, with  status migrainosus 04/09/2018   Insomnia 04/09/2018   Chronic migraine without aura without status migrainosus, not intractable 07/02/2017    PCP: Achilles Dunk, DO  REFERRING PROVIDER: Huel Cote, MD  REFERRING DIAG: [phone] (ICD-10-CM) - Tendinopathy of gluteus medius  THERAPY DIAG:  Pain in left hip  Difficulty in walking, not elsewhere classified  Rationale for Evaluation and Treatment: Rehabilitation  ONSET DATE: 05/15/23 S/P L gluteus medius repair  SUBJECTIVE:   SUBJECTIVE STATEMENT: Patient reports had a bad week last week;  migraines; worried about her husband who is sick; having to use walker  EVAL: Arrives to the clinic with L hip pain s/p L gluteal repair 05/15/23. Denies any drainage on the gauze. Patient has been having L hip pain for 2 years probably as result of her lifting a bag of clothes. Patient thought that she hurt her back. Patient was then given injections on hip but it did not help. Patient was then referred to PT and had PT for around 3 months which did not help. Patient then had injections on the groin which did not help. Patient then had MRI which revealed torn labrum and gluteus medius. Patient then had L gluteus medius repair on 05/15/23 and was then referred to outpatient PT evaluation and management. Patient is 50% PWB on the L LE until next MD appointment  PERTINENT HISTORY: Migraines PAIN:  Are you having pain? Yes: NPRS scale: 9/10 Pain location: side of the L hip Pain description: aching, throbbing Aggravating factors: sitting with the legs propped, walking for around 5 minutes Relieving factors: laying with L LE propped  PRECAUTIONS: Other: follow rehab protocol  RED FLAGS: None   WEIGHT BEARING RESTRICTIONS: Yes 50% PWB on L LE  FALLS:  Has patient fallen in last 6 months? No  LIVING ENVIRONMENT: Lives with: lives with their spouse Lives in: House/apartment Stairs: Yes: External: 3 steps; bilateral but cannot reach both Has following equipment at home: Walker - 4 wheeled and cryotherapy  OCCUPATION: retired  PLOF: Independent and Independent with basic ADLs  PATIENT GOALS: "to be relieved from the pain and be able to walk"  NEXT MD VISIT: 05/25/23  OBJECTIVE:  Note: Objective measures were completed at Evaluation unless otherwise noted.  DIAGNOSTIC FINDINGS: None performed relevant to the area recently  PATIENT SURVEYS:  LEFS to be determined next session  06/13/23 10/80; 12.5%  COGNITION: Overall cognitive status: Within functional limits for tasks  assessed     SENSATION: Not tested   POSTURE: rounded shoulders and forward head    LOWER EXTREMITY MMT:   MMT Right eval Left eval  Hip flexion 4   Hip extension    Hip abduction 4   Hip adduction    Hip internal rotation    Hip external rotation    Knee flexion 4 4-  Knee extension 4 3+  Ankle dorsiflexion 4 4  Ankle plantarflexion 4 4  Ankle inversion    Ankle eversion     (Blank rows = not tested)  LOWER EXTREMITY ROM:  AROM Right eval Left eval  Hip flexion WFL 108  Hip extension    Hip abduction [facility]   Hip adduction    Hip internal rotation    Hip external rotation    Knee flexion WFL WFL, 134  Knee extension [facility]  Ankle dorsiflexion [facility]  Ankle plantarflexion [facility] WFL  Ankle inversion    Ankle eversion     (Blank rows = not tested)  FUNCTIONAL TESTS:  2 minute walk test: to be determined  GAIT: Distance walked: ~ 50 ft Assistive device utilized: Environmental consultant - 4 wheeled Level of assistance: Modified independence Comments: modified 3-point gait, PWB 50% on L   TODAY'S TREATMENT:                                                                                                                              DATE 07/23/23 Nustep seat 7 x 5' dynamic warm up Standing: Heel raises 2 x 10 Slant board 5 x 20" 4" step ups 2 x 10 4" lateral step ups 2 x 10 Hip vectors 2" hold 2 x 8 each LEFS 62/80 77.5% MMT's grossly 4+/5 left hip    07/12/23 Nustep seat 7 x 5' dynamic warm up Standing: Heel raises 2 x 10 Slant board 5  x 20" [employer] leg press 2 plates 2 x 10 Hip extension 2 x 10 each GTB TKE's x 20 DL squat with PVC pole using wall as cue to bump glutes 2 x 10 Standing gentle hip flexor stretch against the wall 5 x 20"   07/09/2023  -SLS @ // bars 4 x 30'' bilaterally -Standing knee flexion w/ 2lb ankle weight 2 x 15 @ // bars -4in forward step-ups with 2lb ankle weight x 20 with no UE support -4in lateral step-ups with 2lb ankle weight x  20 with no UE support -Gait analysis for 249ft- pt ambulating with mild antalgic pattern through LLE with reduced step length and midfoot contact during IC rather that heel. Pt cued for increased Heel IC which reduced antalgia but then demonstrated poor UB coordination with LB swinging. Cued for normal gait patten with larger step pattern and more heel throughout cycle.  -Standing heel raise on decline x 20 -Squat training/practicing x 10 with cues for posterior hip hinge   07/06/2023  -2in forward and lateral stepup with no UE support 2 x 12 with attempted single leg balance with R hip flexion- terminated due to pulling sensation. -Captain morgan pose on 6in box with LLE on ground- 10x10'' neuromuscular retraining with guided tactile cues for L gluteal isometric activation.  -Single Leg Standing hip abduction with RTB and @ HHA bilaterally x 15 for gluteal re-training of gluteus medius -Sit/stands with mirror for postural correction during squatting x10   07/04/2023  -Standing hip vectors in // bars on 2in box 5x5'' bilaterally with cues for upright/stabilized trunk 3 rounds- Neuromuscular re-education of stable lumbar spine w/ isolated hip movement and gluteal re-education for standing movement.  -Attempted 4in step ups with reduced compensation and lack of TKE- regressed to 2in stepup 2x12 -2in lateral stepups 2 x 12 -Standing LLE stool ER/IR 2' @ // bars -Standing heel raises x20 -Semi tandem stance 1 x 30 with LLE back   PATIENT EDUCATION:  Education details: Educated on the pathoanatomy of gluteus medius repair. Educated on the goals and course of rehab. Educated on precautions that needed to be  observed. Education on proper gait pattern on level surface and on stairs Person educated: Patient Education method: Explanation, Demonstration, and Handouts Education comprehension: verbalized understanding and returned demonstration  HOME EXERCISE PROGRAM: 07/23/23 heel raises, mini squats,  hip vectors 06/20/23 Transverse abdominus bracing 5"hold x 10 Bridge x 10   Access Code:   URL: [email or web address] Date: 06/13/2023 Prepared by: [facility]  Exercises - Supine Heel Slide  - 2 x daily - 7 x weekly - 1 sets - 10 reps - Supine Quad Set  - 2 x daily - 7 x weekly - 1 sets - 10 reps - 5 sec hold - Supine Hip Adduction Isometric with Ball  - 2 x daily - 7 x weekly - 1 sets - 10 reps - 5 sec hold - Hooklying Isometric Hip Abduction with Belt  - 2 x daily - 7 x weekly - 1 sets - 10 reps - 5 sec hold  Access Code: [email or web address] URL: [email or web address] Date: 05/22/2023 Prepared by: Krystal Clark  Patient Education - Walking with a Standard Walker - Partial Weight Bearing Access Code: [email or web address] URL: [email or web address] Date: 07/06/2023 Prepared by: Starling Manns  Exercises - Wall Quarter Squat  - 1 x daily - 7 x weekly - 3 sets - 10 reps - 10-15 hold - Standing Terminal Knee Extension with Resistance  - 1 x daily - 7 x weekly - 3 sets - 10 reps - 5 hold - Semi-Tandem Balance at Counter Top Eyes Open  - 1 x daily - 7 x weekly - 3 sets - 10 reps - Single Leg Stance  - 1 x daily - 7 x weekly - 3 sets - 10 reps - Standing Hip Abduction with Resistance at Ankles and Counter Support  - 1 x daily - 7 x weekly - 3 sets - 10 reps - Mini Squat with Counter Support  - 1 x daily - 7 x weekly - 3 sets - 10 reps  ASSESSMENT:  CLINICAL IMPRESSION: Noted decreased antalgic gait today on arrival.  Continued with strengthening and hip mobility per protocol; will be s/p 10 weeks tomorrow. She is concerned about her husband whose health has recently been declining so will d/c PT at this time but has met all set rehab goals and is agreeable to discharge.    OBJECTIVE IMPAIRMENTS: Abnormal gait, decreased activity tolerance, decreased mobility, difficulty walking, decreased strength, and pain.   ACTIVITY LIMITATIONS: carrying, lifting,  bending, sitting, standing, squatting, sleeping, stairs, and transfers  PARTICIPATION LIMITATIONS: meal prep, cleaning, laundry, driving, shopping, community activity, and yard work  PERSONAL FACTORS: 1 comorbidity: migraines  are also affecting patient's functional outcome.   REHAB POTENTIAL: Good  CLINICAL DECISION MAKING: Stable/uncomplicated  EVALUATION COMPLEXITY: Low   GOALS: Goals reviewed with patient? Yes  SHORT TERM GOALS: Target date: 07/03/23 Pt will demonstrate indep in HEP to facilitate carry-over of skilled services and improve functional outcomes Goal status: met  LONG TERM GOALS: Target date: 08/14/23  Pt will be able to ambulate independently > 200 ft on level surface Baseline: ambulatory with rollator Goal status: met  2.  Pt will be able to negotiate stairs with mod independence (use of railing)  Baseline: min assist Goal status: met  3.  Pt will demonstrate increase in LE strength to 4/5 to facilitate ease and safety in ambulation Baseline: 3+/5 Goal status: met  4.  Pt will increase LEFS by at least 9 points in order to demonstrate significant improvement in lower extremity function. Baseline: to  be determined 62/80  Goal status: met   PLAN:  PT FREQUENCY: 2x/week  PT DURATION: 12 weeks  PLANNED INTERVENTIONS: 97164- PT Re-evaluation, 97110-Therapeutic exercises, 97530- Therapeutic activity, 97112- Neuromuscular re-education, 97535- Self Care, 62130- Manual therapy, [phone]- Gait training, and Patient/Family education  PLAN FOR NEXT SESSION:   discharge 1:02 PM, 07/23/23 Caine Barfield Small Oswin Griffith MPT [place] [facility] [place] [phone]

## 2023-08-01 ENCOUNTER — Encounter (HOSPITAL_BASED_OUTPATIENT_CLINIC_OR_DEPARTMENT_OTHER): Payer: Medicare Other | Admitting: Orthopaedic Surgery

## 2023-08-15 ENCOUNTER — Ambulatory Visit: Payer: Medicare Other | Admitting: Psychiatry

## 2023-08-29 ENCOUNTER — Encounter (HOSPITAL_BASED_OUTPATIENT_CLINIC_OR_DEPARTMENT_OTHER): Payer: Medicare Other | Admitting: Orthopaedic Surgery

## 2023-09-10 ENCOUNTER — Ambulatory Visit

## 2023-09-26 ENCOUNTER — Ambulatory Visit (HOSPITAL_BASED_OUTPATIENT_CLINIC_OR_DEPARTMENT_OTHER): Admitting: Orthopaedic Surgery

## 2023-09-26 DIAGNOSIS — M67959 Unspecified disorder of synovium and tendon, unspecified thigh: Secondary | ICD-10-CM | POA: Diagnosis not present

## 2023-09-26 DIAGNOSIS — M67952 Unspecified disorder of synovium and tendon, left thigh: Secondary | ICD-10-CM

## 2023-09-26 MED ORDER — LIDOCAINE HCL 1 % IJ SOLN
4.0000 mL | INTRAMUSCULAR | Status: AC | PRN
  Administered 2023-09-26: 4 mL

## 2023-09-26 MED ORDER — TRIAMCINOLONE ACETONIDE 40 MG/ML IJ SUSP
80.0000 mg | INTRAMUSCULAR | Status: AC | PRN
  Administered 2023-09-26: 80 mg via INTRA_ARTICULAR

## 2023-09-26 NOTE — Progress Notes (Signed)
 Post Operative Evaluation    Procedure/Date of Surgery: Left hip gluteus medius repair 12/10  Interval History:     Presents today status post left hip gluteus medius repair.  Unfortunately she has not been able to make it to physical therapy for several sessions due to the health of her husband.  She is still experiencing pain with laying directly on the side laterally about the hip   PMH/PSH/Family History/Social History/Meds/Allergies:    Past Medical History:  Diagnosis Date  . Allergic rhinitis   . Chronic kidney disease   . Decreased bone density   . Depression   . GERD (gastroesophageal reflux disease)   . Hypothyroid   . Iron deficiency anemia   . Low vitamin D level   . Migraine   . Osteoporosis    Past Surgical History:  Procedure Laterality Date  . BIOPSY  03/10/2019   Procedure: BIOPSY;  Surgeon: Ruby Corporal, MD;  [facility];  Service: Endoscopy;;  gastric   . BIOPSY  03/24/2020   Procedure: BIOPSY;  Surgeon: Ruby Corporal, MD;  [facility];  Service: Endoscopy;;  duodenum gastric  . COLONOSCOPY WITH PROPOFOL  N/A 03/24/2020   Procedure: COLONOSCOPY WITH PROPOFOL ;  Surgeon: Ruby Corporal, MD;  [facility];  Service: Endoscopy;  Laterality: N/A;  1225  . ESOPHAGOGASTRODUODENOSCOPY N/A 03/10/2019   Procedure: ESOPHAGOGASTRODUODENOSCOPY (EGD);  Surgeon: Ruby Corporal, MD;  [facility];  Service: Endoscopy;  Laterality: N/A;  2:40-rescheduled 10/5 @11 :55am per office  . ESOPHAGOGASTRODUODENOSCOPY (EGD) WITH PROPOFOL  N/A 03/24/2020   Procedure: ESOPHAGOGASTRODUODENOSCOPY (EGD) WITH PROPOFOL ;  Surgeon: Ruby Corporal, MD;  [facility];  Service: Endoscopy;  Laterality: N/A;  . GLUTEUS MINIMUS REPAIR Left 05/15/2023   Procedure: LEFT GLUTEUS MEDIUS  REPAIR;  Surgeon: Wilhelmenia Harada, MD;  Location: [facility];  Service: Orthopedics;  Laterality:  Left;  . hemroidectomy  1990  . OVARIAN CYST SURGERY  1972  . SHOULDER SURGERY Left 11/2017  . TUBAL LIGATION  1997  . uterine polyp removal  05/2015   Social History   Socioeconomic History  . Marital status: Married    Spouse name: Not on file  . Number of children: 1  . Years of education: Not on file  . Highest education level: High school graduate  Occupational History  . Occupation: disabled  Tobacco Use  . Smoking status: Former  . Smokeless tobacco: Never  Vaping Use  . Vaping status: Never Used  Substance and Sexual Activity  . Alcohol use: No  . Drug use: No  . Sexual activity: Not on file  Other Topics Concern  . Not on file  Social History Narrative   Lives at home with husband   Right handed   No caffeine    Social Drivers of Corporate investment banker Strain: Not on file  Food Insecurity: Not on file  Transportation Needs: Not on file  Physical Activity: Not on file  Stress: Not on file  Social Connections: Not on file   Family History  Problem Relation Age of Onset  . Anxiety disorder Mother   . Depression Mother   . Bone cancer Mother   . OCD Mother   . Cerebral aneurysm Maternal Uncle   . Drug abuse Brother   .  Depression Brother    Allergies  Allergen Reactions  . Bentyl  Averi.Bjork ] Other (See Comments)    Patient states it caused leg and arm spasms.  . Ivp Dye [Iodinated Contrast Media] Anaphylaxis  . Antihistamines, Loratadine-Type Other (See Comments)    Sleepy  . Ciprofloxacin Nausea And Vomiting  . Dymista [Azelastine-Fluticasone] Other (See Comments)    Heachache  . Gluten Meal   . Lortab [Hydrocodone -Acetaminophen ] Nausea And Vomiting  . Macrobid [Nitrofurantoin] Other (See Comments)    Unknown  . Montelukast Other (See Comments)    Sleep  . Nsaids     Pt can tolerate small amounts of Aleve "mess my stomach up, have had ulcer in the past"  . Nurtec [Rimegepant Sulfate] Nausea Only  . Other     Patient not sure about  these allergies  Darvocet-   Mermantin- "makes me sick on my stomach"  Bio Citrate Calcium with Vitamin D- "hurt stomach"     . Pepcid  [Famotidine ] Other (See Comments)    Headache  . Prednisone     "makes me hyper and gives me a headache"  . Prilosec [Omeprazole ] Other (See Comments)    Headache  . Rosuvastatin Calcium Other (See Comments)    Headaches.  . Vantin [Cefpodoxime] Nausea And Vomiting  . Zofran  [Ondansetron ] Other (See Comments)    Causes headaches per pt   Current Outpatient Medications  Medication Sig Dispense Refill  . aspirin  EC 325 MG tablet Take 1 tablet (325 mg total) by mouth daily. 14 tablet 0  . B-12 METHYLCOBALAMIN PO Take by mouth.    . Budeson-Glycopyrrol-Formoterol (BREZTRI AEROSPHERE IN) Inhale into the lungs.    . buPROPion  (WELLBUTRIN  SR) 150 MG 12 hr tablet Take 1 tablet (150 mg total) by mouth 2 (two) times daily. 60 tablet 2  . Eszopiclone  3 MG TABS TAKE 1 TABLET BY MOUTH IMMEDIATELY BEFORE BEDTIME 30 tablet 5  . HYDROmorphone  (DILAUDID ) 2 MG tablet Take 1 tablet (2 mg total) by mouth every 4 (four) hours as needed for severe pain (pain score 7-10). 30 tablet 0  . levothyroxine  (SYNTHROID ) 50 MCG tablet Take 50 mcg by mouth daily before breakfast. Take 2 50 mg tablets daily    . liothyronine (CYTOMEL) 5 MCG tablet Take 10 mcg by mouth daily.    . LORazepam  (ATIVAN ) 0.5 MG tablet TAKE 1 TABLET BY MOUTH EVERY 8 HOURS 60 tablet 0  . Magnesium 200 MG TABS Take 200 mg by mouth daily.     . oxyCODONE  (ROXICODONE ) 5 MG immediate release tablet Take 1 tablet (5 mg total) by mouth every 4 (four) hours as needed for severe pain (pain score 7-10) or breakthrough pain. 15 tablet 0  . promethazine  (PHENERGAN ) 25 MG tablet Take 1 tablet (25 mg total) by mouth every 6 (six) hours as needed for nausea or vomiting. 12 tablet 0  . SUMAtriptan  (IMITREX ) 100 MG tablet Take 1 tablet (100 mg total) by mouth once as needed for up to 1 dose for migraine. May repeat once  in 2 hours if headache persists or recurs. Must make an Appointment for Future Refill Request 10 tablet 11  . topiramate  (TOPAMAX ) 50 MG tablet Take 3 tablets (150 mg total) by mouth at bedtime. 270 tablet 3  . Vitamin D, Ergocalciferol, (DRISDOL) 1.25 MG (50000 UNIT) CAPS capsule Take 50,000 Units by mouth once a week.     No current facility-administered medications for this visit.   No results found.  Review of Systems:  A ROS was performed including pertinent positives and negatives as documented in the HPI.   Musculoskeletal Exam:    There were no vitals taken for this visit.  Left hip incision is well-appearing without erythema or drainage.  She has 3 degrees internal/external rotation of the left hip without pain.  Walks with mild antalgic gait with walker.  Distal neurosensory exam is intact  Imaging:      I personally reviewed and interpreted the radiographs.   Assessment:   status post left hip gluteus medius tendon repair with some lateral based pain over the incisional repair.  At today's visit I did recommend ultrasound-guided injection over the gluteal area to hopefully get her some pain relief as she is able to work through physical therapy.  I would like her to be seen by physical therapy that any pain location to continue to work on hip and gluteal strengthening.  I do believe should also benefit from IT mobilization Plan :    -Return to clinic in 4 weeks for reassessment     Procedure Note  Patient: Sabrina Duke             Date of Birth: 1955/11/11           MRN: [id number]             Visit Date: 09/26/2023  Procedures: Visit Diagnoses:  1. Tendinopathy of gluteus medius     Large Joint Inj: L greater trochanter on 09/26/2023 5:13 PM Indications: pain Details: 22 G 3.5 in needle, ultrasound-guided anterolateral approach  Arthrogram: No  Medications: 4 mL lidocaine  1 %; 80 mg triamcinolone  acetonide 40 MG/ML Outcome: tolerated well, no immediate  complications Procedure, treatment alternatives, risks and benefits explained, specific risks discussed. Consent was given by the patient. Immediately prior to procedure a time out was called to verify the correct patient, procedure, equipment, support staff and site/side marked as required. Patient was prepped and draped in the usual sterile fashion.        I personally saw and evaluated the patient, and participated in the management and treatment plan.  Wilhelmenia Harada, MD Attending Physician, Orthopedic Surgery  This document was dictated using Dragon voice recognition software. A reasonable attempt at proof reading has been made to minimize errors.

## 2023-10-25 ENCOUNTER — Other Ambulatory Visit: Payer: Self-pay

## 2023-10-25 ENCOUNTER — Encounter (HOSPITAL_COMMUNITY): Payer: Self-pay

## 2023-10-25 ENCOUNTER — Ambulatory Visit (HOSPITAL_COMMUNITY): Attending: Orthopaedic Surgery

## 2023-10-25 DIAGNOSIS — M67959 Unspecified disorder of synovium and tendon, unspecified thigh: Secondary | ICD-10-CM | POA: Insufficient documentation

## 2023-10-25 DIAGNOSIS — M25552 Pain in left hip: Secondary | ICD-10-CM | POA: Diagnosis present

## 2023-10-25 DIAGNOSIS — R262 Difficulty in walking, not elsewhere classified: Secondary | ICD-10-CM | POA: Insufficient documentation

## 2023-10-25 NOTE — Therapy (Addendum)
 OUTPATIENT PHYSICAL THERAPY LOWER EXTREMITY EVALUATION   Patient Name: Sabrina Duke MRN: [id number] DOB:[birth year], 68 y.o., female Today's Date: 10/26/2023  END OF SESSION:  PT End of Session - 10/25/23 1442     Visit Number 1    Number of Visits 24    Authorization Type BC[employer] Medicare    PT Start Time 1430    PT Stop Time 1515    PT Time Calculation (min) 45 min    Activity Tolerance Patient tolerated treatment well    Behavior During Therapy WFL for tasks assessed/performed             Past Medical History:  Diagnosis Date   Allergic rhinitis    Chronic kidney disease    Decreased bone density    Depression    GERD (gastroesophageal reflux disease)    Hypothyroid    Iron deficiency anemia    Low vitamin D level    Migraine    Osteoporosis    Past Surgical History:  Procedure Laterality Date   BIOPSY  03/10/2019   Procedure: BIOPSY;  Surgeon: Ruby Corporal, MD;  [facility];  Service: Endoscopy;;  gastric    BIOPSY  03/24/2020   Procedure: BIOPSY;  Surgeon: Ruby Corporal, MD;  [facility];  Service: Endoscopy;;  duodenum gastric   COLONOSCOPY WITH PROPOFOL  N/A 03/24/2020   Procedure: COLONOSCOPY WITH PROPOFOL ;  Surgeon: Ruby Corporal, MD;  [facility];  Service: Endoscopy;  Laterality: N/A;  1225   ESOPHAGOGASTRODUODENOSCOPY N/A 03/10/2019   Procedure: ESOPHAGOGASTRODUODENOSCOPY (EGD);  Surgeon: Ruby Corporal, MD;  [facility];  Service: Endoscopy;  Laterality: N/A;  2:40-rescheduled 10/5 @11 :55am per office   ESOPHAGOGASTRODUODENOSCOPY (EGD) WITH PROPOFOL  N/A 03/24/2020   Procedure: ESOPHAGOGASTRODUODENOSCOPY (EGD) WITH PROPOFOL ;  Surgeon: Ruby Corporal, MD;  [facility];  Service: Endoscopy;  Laterality: N/A;   GLUTEUS MINIMUS REPAIR Left 05/15/2023   Procedure: LEFT GLUTEUS MEDIUS  REPAIR;  Surgeon: Wilhelmenia Harada, MD;  Location: [facility];  Service: Orthopedics;   Laterality: Left;   hemroidectomy  1990   OVARIAN CYST SURGERY  1972   SHOULDER SURGERY Left 11/2017   TUBAL LIGATION  1997   uterine polyp removal  05/2015   Patient Active Problem List   Diagnosis Date Noted   Tendinopathy of gluteus medius 05/15/2023   Loud snoring 04/20/2022   Uncontrolled morning headache 04/20/2022   Reactive depression 04/20/2022   Celiac disease 06/14/2020   Abdominal pain, left lower quadrant 06/14/2020   Constipation 06/14/2020   IBS (irritable bowel syndrome) 03/15/2020   Ileus (HCC) 01/16/2020   GERD (gastroesophageal reflux disease)    Hypothyroid    Abdominal pain, epigastric 02/03/2019   Nausea without vomiting 02/03/2019   Acid reflux 02/03/2019   Anxiety state 04/09/2018   Migraine, unspecified, intractable, with status migrainosus 04/09/2018   Insomnia 04/09/2018   Chronic migraine without aura without status migrainosus, not intractable 07/02/2017    PCP: Crecencio Dodge DO  REFERRING PROVIDER: Wilhelmenia Harada MD  REFERRING DIAG: [phone] (ICD-10-CM) - Tendinopathy of gluteus medius   THERAPY DIAG:  Pain in left hip  Difficulty in walking, not elsewhere classified  Rationale for Evaluation and Treatment: Rehabilitation  ONSET DATE: 05/2023  MD note (09/26/23) Assessment - status post left hip gluteus medius tendon repair with some lateral based pain over the incisional repair. At today's visit I did recommend ultrasound-guided injection over the gluteal area to hopefully get her  some pain relief as she is able to work through physical therapy.  I would like her to be seen by physical therapy that any pain location to continue to work on hip and gluteal strengthening.  I do believe should also benefit from IT mobilization   SUBJECTIVE:   SUBJECTIVE STATEMENT:  Only got to do a couple weeks of PT due to her husband being sick and went back to the MD and MD prescribed her back to PT and gave her an injection and it feels better. She can't  push a grocery cart, sleep on the R side even with a pillow b/c the L hurts. Wear tennis shoes on unstable surfaces more but wears flat/flip flops on even ground.  PERTINENT HISTORY: Hx L glut med repair (05/2023); migraine headaches; L greater trochanter injection (09/26/23) PAIN:  Are you having pain? Yes: NPRS scale: 3/10 - pushing grocery cart on concrete floor Pain location: L hip lateral aspect Pain description: ache burn Aggravating factors: pushing a grocery cart, laying on the side Relieving factors: steroid injection  PRECAUTIONS: None  RED FLAGS: None   WEIGHT BEARING RESTRICTIONS: No  FALLS:  Has patient fallen in last 6 months? No  LIVING ENVIRONMENT: Lives with: lives with their spouse Lives in: House/apartment Stairs: No Has following equipment at home: None  OCCUPATION: Retired; training a Printmaker now  PLOF: Independent  PATIENT GOALS: Be able to prevent any pain from coming back  NEXT MD VISIT: 11/21/23  OBJECTIVE:  Note: Objective measures were completed at Evaluation unless otherwise noted.  PATIENT SURVEYS:  LEFS - 45/80  COGNITION: Overall cognitive status: Within functional limits for tasks assessed     SENSATION: WFL  EDEMA:  none  POSTURE: No Significant postural limitations  PALPATION: Lateral aspect of L glut/IT B tenderness to light palpation; max TTP and c/o pain with attempts at gentle rolling of ITB from hip to lower 2/3 thigh Greater troch pinpoint tenderness Glut max TTP/mtrp found below iliac crest  LOWER EXTREMITY ROM: **ITB tightness noted  Passive ROM Right eval Left eval  Hip flexion    Hip extension    Hip abduction    Hip adduction  Slightly limited secondary to ITB tightness/pain  Hip internal rotation  Slightly limited secondary to pain  Hip external rotation    Knee flexion    Knee extension    Ankle dorsiflexion    Ankle plantarflexion    Ankle inversion    Ankle eversion     (Blank rows = not  tested)  LOWER EXTREMITY MMT:  MMT Right eval Left eval  Hip flexion 4+ 4-  Hip extension 4 4-  Hip abduction 4 4-  Hip adduction    Hip internal rotation    Hip external rotation    Knee flexion 4 4  Knee extension 4+ 4+  Ankle dorsiflexion 4+ 4+  Ankle plantarflexion    Ankle inversion    Ankle eversion     (Blank rows = not tested)  LOWER EXTREMITY SPECIAL TESTS :  (L) Hip special tests: Portia Brittle (FABER) test: positive  and Ober's test: positive  - tightness/pain in posterolateral aspect of hip  FUNCTIONAL TESTS:  5 times sit to stand: 13s without UE assist 30 seconds chair stand test 10 repetitions without UE assist Norms:   Age 65-64 31-69 70-74 75-79 80-84 85-89 90-94  Women 15 15 14 13 12 11 9   Men 17 16 15 14 13 11 9    GAIT: Distance walked: Test next  visit Assistive device utilized: None Level of assistance: Complete Independence Comments: L LE - decreased hip extension, decreased stance time, decreased foot clearance                                                                                                                               TREATMENT DATE:   10/25/23 Eval + HEP printout and education   PATIENT EDUCATION:  Education details: patient education on pathology and sxs of increased  Person educated: Patient Education method: Programmer, multimedia, Demonstration, Actor cues, and Handouts Education comprehension: verbalized understanding, returned demonstration, and verbal cues required  HOME EXERCISE PROGRAM: - Bridging w/ hip abduction and core engagement - 3 x 10 - Frog bridges (bridging in supine w/ ER and hip abduction) - 3 x 10 - ITB stretch in standing - 6 x 10s   ASSESSMENT:  CLINICAL IMPRESSION: Patient is a 68 y.o. female who was seen today for physical therapy evaluation and treatment for L hip tendinopathy and objective complaints of pain with pushing and with functional ADL performance. Limitations in functional stair navigation, walking  prolonged periods, and with sleeping impacting QOL at this time. Objectively TTP along lateral greater trochanter, L gluts, L TFL, L ITB and decreased functional BLE strength with appropriate form noted as indicated in risk for falls in the 30s STS test below normative values for age norm. Pt educated on importance of functional hip strengthening in order to mitigate risk for reinjury and to improve tolerance to ADL. Pt reports reduced pain since injection and educated on importance of addressing underlying weakness associated with MD referred pathology with understanding noted. Pt prognosis good at this time.    OBJECTIVE IMPAIRMENTS: Abnormal gait, decreased activity tolerance, decreased balance, difficulty walking, decreased ROM, decreased strength, and impaired flexibility.   ACTIVITY LIMITATIONS: carrying, lifting, sleeping, stairs, locomotion level, and pushing  PARTICIPATION LIMITATIONS: laundry, shopping, community activity, and yard work  PERSONAL FACTORS: Age are also affecting patient's functional outcome.   REHAB POTENTIAL: Good  CLINICAL DECISION MAKING: Stable/uncomplicated  EVALUATION COMPLEXITY: Low   GOALS: Goals reviewed with patient? No  SHORT TERM GOALS: Target date: 11/13/23 Pt will be independent with HEP reporting compliance 5/7 days a week.  Baseline: prescribed Goal status: INITIAL  2.  Pt will demonstrate independence with step into tub without pain. Baseline: NT Goal status: INITIAL  LONG TERM GOALS: Target date: 12/13/23  Pt will report no pain with compliance with HEP and performance of activities in home.  Baseline: limiting activity secondary to slight pain Goal status: INITIAL  2.  Pt will improve 30s stand from chair test within normative values for age without pain and with proper form.  Baseline: below norm Goal status: INITIAL  3.  Pt will report no pain with palpation along lateral L hip needed for improving tolerance to sleeping on side and  report tolerance to sleeping without waking from pain.  Baseline: painful for s/lying Goal status: INITIAL  4.  Pt will achieve MMT at least 4+/5 in hip abduction on L side without pain in order to indicate improved functional glut strength needed for transfers and endurance with gait.  Baseline:  Goal status: INITIAL  PLAN:  PT FREQUENCY: 1-2x/week  PT DURATION: 6 weeks  PLANNED INTERVENTIONS: 97110-Therapeutic exercises, 97530- Therapeutic activity, 97112- Neuromuscular re-education, 97535- Self Care, 91478- Manual therapy, [phone]- Gait training, Patient/Family education, Balance training, and Stair training  PLAN FOR NEXT SESSION: goals reviewed; progression of core engagement and lumbopelvic stability interventions  Danford Dupes, DPT [facility] [place] 336 [phone] office  [employer] [facility] Medicare Authorization Request  Visit Dx Codes: [phone]   Functional Tool Score: 45/80  For all possible CPT codes, reference the Planned Interventions line above.     Check all conditions that are expected to impact treatment: {Conditions expected to impact treatment:Unknown     Lavaun Porto, PT 10/26/2023, 11:44 AM

## 2023-11-20 ENCOUNTER — Ambulatory Visit (HOSPITAL_COMMUNITY): Attending: Orthopaedic Surgery

## 2023-11-20 DIAGNOSIS — M25552 Pain in left hip: Secondary | ICD-10-CM | POA: Diagnosis present

## 2023-11-20 DIAGNOSIS — R262 Difficulty in walking, not elsewhere classified: Secondary | ICD-10-CM | POA: Insufficient documentation

## 2023-11-20 NOTE — Therapy (Signed)
 OUTPATIENT PHYSICAL THERAPY LOWER EXTREMITY EVALUATION   Patient Name: Sabrina Duke MRN: [id number] DOB:Feb 20, 1956, 69 y.o., female Today's Date: 11/20/2023  END OF SESSION:  PT End of Session - 11/20/23 1337     Visit Number 2    Number of Visits 24    Date for PT Re-Evaluation 12/13/23    Authorization Type BCBS Medicare    Authorization Time Period seeking auth    Progress Note Due on Visit 10    PT Start Time 1308    PT Stop Time 1345    PT Time Calculation (min) 37 min    Activity Tolerance Patient tolerated treatment well    Behavior During Therapy WFL for tasks assessed/performed           Past Medical History:  Diagnosis Date   Allergic rhinitis    Chronic kidney disease    Decreased bone density    Depression    GERD (gastroesophageal reflux disease)    Hypothyroid    Iron deficiency anemia    Low vitamin D level    Migraine    Osteoporosis    Past Surgical History:  Procedure Laterality Date   BIOPSY  03/10/2019   Procedure: BIOPSY;  Surgeon: Ruby Corporal, MD;  [facility];  Service: Endoscopy;;  gastric    BIOPSY  03/24/2020   Procedure: BIOPSY;  Surgeon: Ruby Corporal, MD;  [facility];  Service: Endoscopy;;  duodenum gastric   COLONOSCOPY WITH PROPOFOL  N/A 03/24/2020   Procedure: COLONOSCOPY WITH PROPOFOL ;  Surgeon: Ruby Corporal, MD;  [facility];  Service: Endoscopy;  Laterality: N/A;  1225   ESOPHAGOGASTRODUODENOSCOPY N/A 03/10/2019   Procedure: ESOPHAGOGASTRODUODENOSCOPY (EGD);  Surgeon: Ruby Corporal, MD;  [facility];  Service: Endoscopy;  Laterality: N/A;  2:40-rescheduled 10/5 @11 :55am per office   ESOPHAGOGASTRODUODENOSCOPY (EGD) WITH PROPOFOL  N/A 03/24/2020   Procedure: ESOPHAGOGASTRODUODENOSCOPY (EGD) WITH PROPOFOL ;  Surgeon: Ruby Corporal, MD;  [facility];  Service: Endoscopy;  Laterality: N/A;   GLUTEUS MINIMUS REPAIR Left 05/15/2023   Procedure: LEFT GLUTEUS  MEDIUS  REPAIR;  Surgeon: Wilhelmenia Harada, MD;  Location: [facility];  Service: Orthopedics;  Laterality: Left;   hemroidectomy  1990   OVARIAN CYST SURGERY  1972   SHOULDER SURGERY Left 11/2017   TUBAL LIGATION  1997   uterine polyp removal  05/2015   Patient Active Problem List   Diagnosis Date Noted   Tendinopathy of gluteus medius 05/15/2023   Loud snoring 04/20/2022   Uncontrolled morning headache 04/20/2022   Reactive depression 04/20/2022   Celiac disease 06/14/2020   Abdominal pain, left lower quadrant 06/14/2020   Constipation 06/14/2020   IBS (irritable bowel syndrome) 03/15/2020   Ileus (HCC) 01/16/2020   GERD (gastroesophageal reflux disease)    Hypothyroid    Abdominal pain, epigastric 02/03/2019   Nausea without vomiting 02/03/2019   Acid reflux 02/03/2019   Anxiety state 04/09/2018   Migraine, unspecified, intractable, with status migrainosus 04/09/2018   Insomnia 04/09/2018   Chronic migraine without aura without status migrainosus, not intractable 07/02/2017    PCP: Crecencio Dodge DO  REFERRING PROVIDER: Wilhelmenia Harada MD  REFERRING DIAG: [phone] (ICD-10-CM) - Tendinopathy of gluteus medius   THERAPY DIAG:  Pain in left hip  Difficulty in walking, not elsewhere classified  Rationale for Evaluation and Treatment: Rehabilitation  ONSET DATE: 05/2023  MD note (09/26/23) Assessment - status post left hip gluteus medius tendon repair with some  lateral based pain over the incisional repair. At today's visit I did recommend ultrasound-guided injection over the gluteal area to hopefully get her some pain relief as she is able to work through physical therapy.  I would like her to be seen by physical therapy that any pain location to continue to work on hip and gluteal strengthening.  I do believe should also benefit from IT mobilization   SUBJECTIVE:   SUBJECTIVE STATEMENT: No pain today, just notices the left hip.   Eval: Only got to do a  couple weeks of PT due to her husband being sick and went back to the MD and MD prescribed her back to PT and gave her an injection and it feels better. She can't push a grocery cart, sleep on the R side even with a pillow b/c the L hurts. Wear tennis shoes on unstable surfaces more but wears flat/flip flops on even ground.  PERTINENT HISTORY: Hx L glut med repair (05/2023); migraine headaches; L greater trochanter injection (09/26/23) PAIN:  Are you having pain? Yes: NPRS scale: 3/10 - pushing grocery cart on concrete floor Pain location: L hip lateral aspect Pain description: ache burn Aggravating factors: pushing a grocery cart, laying on the side Relieving factors: steroid injection  PRECAUTIONS: None  RED FLAGS: None   WEIGHT BEARING RESTRICTIONS: No  FALLS:  Has patient fallen in last 6 months? No  LIVING ENVIRONMENT: Lives with: lives with their spouse Lives in: House/apartment Stairs: No Has following equipment at home: None  OCCUPATION: Retired; training a Printmaker now  PLOF: Independent  PATIENT GOALS: Be able to prevent any pain from coming back  NEXT MD VISIT: 11/21/23  OBJECTIVE:  Note: Objective measures were completed at Evaluation unless otherwise noted.  PATIENT SURVEYS:  LEFS - 45/80  COGNITION: Overall cognitive status: Within functional limits for tasks assessed     SENSATION: WFL  EDEMA:  none  POSTURE: No Significant postural limitations  PALPATION: Lateral aspect of L glut/IT B tenderness to light palpation; max TTP and c/o pain with attempts at gentle rolling of ITB from hip to lower 2/3 thigh Greater troch pinpoint tenderness Glut max TTP/mtrp found below iliac crest  LOWER EXTREMITY ROM: **ITB tightness noted  Passive ROM Right eval Left eval  Hip flexion    Hip extension    Hip abduction    Hip adduction  Slightly limited secondary to ITB tightness/pain  Hip internal rotation  Slightly limited secondary to pain  Hip external  rotation    Knee flexion    Knee extension    Ankle dorsiflexion    Ankle plantarflexion    Ankle inversion    Ankle eversion     (Blank rows = not tested)  LOWER EXTREMITY MMT:  MMT Right eval Left eval  Hip flexion 4+ 4-  Hip extension 4 4-  Hip abduction 4 4-  Hip adduction    Hip internal rotation    Hip external rotation    Knee flexion 4 4  Knee extension 4+ 4+  Ankle dorsiflexion 4+ 4+  Ankle plantarflexion    Ankle inversion    Ankle eversion     (Blank rows = not tested)  LOWER EXTREMITY SPECIAL TESTS :  (L) Hip special tests: Portia Brittle (FABER) test: positive  and Ober's test: positive  - tightness/pain in posterolateral aspect of hip  FUNCTIONAL TESTS:  5 times sit to stand: 13s without UE assist 30 seconds chair stand test 10 repetitions without UE assist Norms:  Age 8-64 26-69 28-74 31-79 53-84 51-89 64-94  Women 15 15 14 13 12 11 9   Men 17 16 15 14 13 11 9    GAIT: Distance walked: Test next visit Assistive device utilized: None Level of assistance: Complete Independence Comments: L LE - decreased hip extension, decreased stance time, decreased foot clearance                                                                                                                               TREATMENT DATE:   11/20/2023  -Supine bridge x15 w/ 3'' isometric with GTB -Frog bridges x 15 w/ 5'' isometric -Side lying clamshells with GTB 2 x 15'' 3 second isometric -12in box elevated bridges for HS isometric w/ 3'' isometric 2 x 12 -Standing hip abduction vector x 15 with 3'' isometric at // bars -Sit/stand with tidal ball and GTB at knees x 15  10/25/23 Eval + HEP printout and education   PATIENT EDUCATION:  Education details: patient education on pathology and sxs of increased  Person educated: Patient Education method: Programmer, multimedia, Demonstration, Tactile cues, and Handouts Education comprehension: verbalized understanding, returned demonstration, and  verbal cues required  HOME EXERCISE PROGRAM: - Bridging w/ hip abduction and core engagement - 3 x 10 - Frog bridges (bridging in supine w/ ER and hip abduction) - 3 x 10 - ITB stretch in standing - 6 x 10s   Access Code: [email or web address] URL: [email or web address] Date: 11/20/2023 Prepared by: Irene Mannheim  Exercises - Clamshell with Resistance  - 1 x daily - 7 x weekly - 2-3 sets - 15 reps - Supine Bridge with Resistance Band  - 1 x daily - 7 x weekly - 2-3 sets - 15 reps - Supine Bilateral Hamstring Sets  - 1 x daily - 7 x weekly - 3 sets - 12-15 reps - Standing Hip Abduction with Counter Support  - 1 x daily - 7 x weekly - 3 sets - 10 reps  ASSESSMENT:  CLINICAL IMPRESSION: Pt tolerated first treatment session back. Initiate HEP with GTB with education on appropriate fatigue. Pt showing great movement patterns with improved controlled compared to see POC. Pt will benefit from skilled Physical Therapy services to address deficits/limitations in order to improve functional and QOL.   Eval:Patient is a 68 y.o. female who was seen today for physical therapy evaluation and treatment for L hip tendinopathy and objective complaints of pain with pushing and with functional ADL performance. Limitations in functional stair navigation, walking prolonged periods, and with sleeping impacting QOL at this time. Objectively TTP along lateral greater trochanter, L gluts, L TFL, L ITB and decreased functional BLE strength with appropriate form noted as indicated in risk for falls in the 30s STS test below normative values for age norm. Pt educated on importance of functional hip strengthening in order to mitigate risk for reinjury and to improve tolerance to ADL. Pt reports reduced pain since injection and educated on  importance of addressing underlying weakness associated with MD referred pathology with understanding noted. Pt prognosis good at this time.    OBJECTIVE IMPAIRMENTS: Abnormal gait,  decreased activity tolerance, decreased balance, difficulty walking, decreased ROM, decreased strength, and impaired flexibility.   ACTIVITY LIMITATIONS: carrying, lifting, sleeping, stairs, locomotion level, and pushing  PARTICIPATION LIMITATIONS: laundry, shopping, community activity, and yard work  PERSONAL FACTORS: Age are also affecting patient's functional outcome.   REHAB POTENTIAL: Good  CLINICAL DECISION MAKING: Stable/uncomplicated  EVALUATION COMPLEXITY: Low   GOALS: Goals reviewed with patient? No  SHORT TERM GOALS: Target date: 11/13/23 Pt will be independent with HEP reporting compliance 5/7 days a week.  Baseline: prescribed Goal status: INITIAL  2.  Pt will demonstrate independence with step into tub without pain. Baseline: NT Goal status: MET  LONG TERM GOALS: Target date: 12/13/23  Pt will report no pain with compliance with HEP and performance of activities in home.  Baseline: limiting activity secondary to slight pain Goal status: INITIAL  2.  Pt will improve 30s stand from chair test within normative values for age without pain and with proper form.  Baseline: below norm Goal status: INITIAL  3.  Pt will report no pain with palpation along lateral L hip needed for improving tolerance to sleeping on side and report tolerance to sleeping without waking from pain.  Baseline: painful for s/lying Goal status: INITIAL  4.  Pt will achieve MMT at least 4+/5 in hip abduction on L side without pain in order to indicate improved functional glut strength needed for transfers and endurance with gait.  Baseline:  Goal status: INITIAL  PLAN:  PT FREQUENCY: 1-2x/week  PT DURATION: 6 weeks  PLANNED INTERVENTIONS: 97110-Therapeutic exercises, 97530- Therapeutic activity, 97112- Neuromuscular re-education, 97535- Self Care, 16109- Manual therapy, [phone]- Gait training, Patient/Family education, Balance training, and Stair training  PLAN FOR NEXT SESSION: goals  reviewed; progression of core engagement and lumbopelvic stability interventions  Astrid Lay, DPT [facility] [place] 336 [phone] office   Gatha Kaska, PT 11/20/2023, 1:46 PM

## 2023-11-21 ENCOUNTER — Encounter: Payer: Self-pay | Admitting: Family Medicine

## 2023-11-21 ENCOUNTER — Ambulatory Visit (HOSPITAL_BASED_OUTPATIENT_CLINIC_OR_DEPARTMENT_OTHER): Admitting: Orthopaedic Surgery

## 2023-11-22 ENCOUNTER — Ambulatory Visit (HOSPITAL_COMMUNITY)

## 2023-11-22 DIAGNOSIS — M25552 Pain in left hip: Secondary | ICD-10-CM | POA: Diagnosis not present

## 2023-11-22 DIAGNOSIS — R262 Difficulty in walking, not elsewhere classified: Secondary | ICD-10-CM

## 2023-11-22 NOTE — Therapy (Signed)
 OUTPATIENT PHYSICAL THERAPY LOWER EXTREMITY TREATMENT   Patient Name: Sabrina Duke MRN: [id number] DOB:1955-10-11, 68 y.o., female Today's Date: 11/22/2023  END OF SESSION:  PT End of Session - 11/22/23 1259     Visit Number 3    Number of Visits 24    Date for PT Re-Evaluation 12/13/23    Authorization Type [employer] Medicare    Authorization Time Period seeking auth    Progress Note Due on Visit 10    PT Start Time 1300    PT Stop Time 1340    PT Time Calculation (min) 40 min    Activity Tolerance Patient tolerated treatment well    Behavior During Therapy WFL for tasks assessed/performed           Past Medical History:  Diagnosis Date   Allergic rhinitis    Chronic kidney disease    Decreased bone density    Depression    GERD (gastroesophageal reflux disease)    Hypothyroid    Iron deficiency anemia    Low vitamin D level    Migraine    Osteoporosis    Past Surgical History:  Procedure Laterality Date   BIOPSY  03/10/2019   Procedure: BIOPSY;  Surgeon: Ruby Corporal, MD;  Location: [facility];  Service: Endoscopy;;  gastric    BIOPSY  03/24/2020   Procedure: BIOPSY;  Surgeon: Ruby Corporal, MD;  Location: [facility];  Service: Endoscopy;;  duodenum gastric   COLONOSCOPY WITH PROPOFOL  N/A 03/24/2020   Procedure: COLONOSCOPY WITH PROPOFOL ;  Surgeon: Ruby Corporal, MD;  Location: [facility];  Service: Endoscopy;  Laterality: N/A;  1225   ESOPHAGOGASTRODUODENOSCOPY N/A 03/10/2019   Procedure: ESOPHAGOGASTRODUODENOSCOPY (EGD);  Surgeon: Ruby Corporal, MD;  Location: [facility];  Service: Endoscopy;  Laterality: N/A;  2:40-rescheduled 10/5 @11 :55am per office   ESOPHAGOGASTRODUODENOSCOPY (EGD) WITH PROPOFOL  N/A 03/24/2020   Procedure: ESOPHAGOGASTRODUODENOSCOPY (EGD) WITH PROPOFOL ;  Surgeon: Ruby Corporal, MD;  Location: [facility];  Service: Endoscopy;  Laterality: N/A;   GLUTEUS MINIMUS REPAIR Left 05/15/2023   Procedure: LEFT GLUTEUS  MEDIUS  REPAIR;  Surgeon: Wilhelmenia Harada, MD;  Location: [facility];  Service: Orthopedics;  Laterality: Left;   hemroidectomy  1990   OVARIAN CYST SURGERY  1972   SHOULDER SURGERY Left 11/2017   TUBAL LIGATION  1997   uterine polyp removal  05/2015   Patient Active Problem List   Diagnosis Date Noted   Tendinopathy of gluteus medius 05/15/2023   Loud snoring 04/20/2022   Uncontrolled morning headache 04/20/2022   Reactive depression 04/20/2022   Celiac disease 06/14/2020   Abdominal pain, left lower quadrant 06/14/2020   Constipation 06/14/2020   IBS (irritable bowel syndrome) 03/15/2020   Ileus (HCC) 01/16/2020   GERD (gastroesophageal reflux disease)    Hypothyroid    Abdominal pain, epigastric 02/03/2019   Nausea without vomiting 02/03/2019   Acid reflux 02/03/2019   Anxiety state 04/09/2018   Migraine, unspecified, intractable, with status migrainosus 04/09/2018   Insomnia 04/09/2018   Chronic migraine without aura without status migrainosus, not intractable 07/02/2017    PCP: Crecencio Dodge DO  REFERRING PROVIDER: Wilhelmenia Harada MD  REFERRING DIAG: [id number] (ICD-10-CM) - Tendinopathy of gluteus medius   THERAPY DIAG:  Pain in left hip  Difficulty in walking, not elsewhere classified  Rationale for Evaluation and Treatment: Rehabilitation  ONSET DATE: 05/2023  MD note (09/26/23) Assessment - status post left hip gluteus medius tendon repair with some  lateral based pain over the incisional repair. At today's visit I did recommend ultrasound-guided injection over the gluteal area to hopefully get her some pain relief as she is able to work through physical therapy.  I would like her to be seen by physical therapy that any pain location to continue to work on hip and gluteal strengthening.  I do believe should also benefit from IT mobilization   SUBJECTIVE:   SUBJECTIVE STATEMENT: Left hip injection really helped; 1/10 pain just a little  uncomfortable  Eval: Only got to do a couple weeks of PT due to her husband being sick and went back to the MD and MD prescribed her back to PT and gave her an injection and it feels better. She can't push a grocery cart, sleep on the R side even with a pillow b/c the L hurts. Wear tennis shoes on unstable surfaces more but wears flat/flip flops on even ground.  PERTINENT HISTORY: Hx L glut med repair (05/2023); migraine headaches; L greater trochanter injection (09/26/23) PAIN:  Are you having pain? Yes: NPRS scale: 3/10 - pushing grocery cart on concrete floor Pain location: L hip lateral aspect Pain description: ache burn Aggravating factors: pushing a grocery cart, laying on the side Relieving factors: steroid injection  PRECAUTIONS: None  RED FLAGS: None   WEIGHT BEARING RESTRICTIONS: No  FALLS:  Has patient fallen in last 6 months? No  LIVING ENVIRONMENT: Lives with: lives with their spouse Lives in: House/apartment Stairs: No Has following equipment at home: None  OCCUPATION: Retired; training a Printmaker now  PLOF: Independent  PATIENT GOALS: Be able to prevent any pain from coming back  NEXT MD VISIT: 11/21/23  OBJECTIVE:  Note: Objective measures were completed at Evaluation unless otherwise noted.  PATIENT SURVEYS:  LEFS - 45/80  COGNITION: Overall cognitive status: Within functional limits for tasks assessed     SENSATION: WFL  EDEMA:  none  POSTURE: No Significant postural limitations  PALPATION: Lateral aspect of L glut/IT B tenderness to light palpation; max TTP and c/o pain with attempts at gentle rolling of ITB from hip to lower 2/3 thigh Greater troch pinpoint tenderness Glut max TTP/mtrp found below iliac crest  LOWER EXTREMITY ROM: **ITB tightness noted  Passive ROM Right eval Left eval  Hip flexion    Hip extension    Hip abduction    Hip adduction  Slightly limited secondary to ITB tightness/pain  Hip internal rotation  Slightly  limited secondary to pain  Hip external rotation    Knee flexion    Knee extension    Ankle dorsiflexion    Ankle plantarflexion    Ankle inversion    Ankle eversion     (Blank rows = not tested)  LOWER EXTREMITY MMT:  MMT Right eval Left eval  Hip flexion 4+ 4-  Hip extension 4 4-  Hip abduction 4 4-  Hip adduction    Hip internal rotation    Hip external rotation    Knee flexion 4 4  Knee extension 4+ 4+  Ankle dorsiflexion 4+ 4+  Ankle plantarflexion    Ankle inversion    Ankle eversion     (Blank rows = not tested)  LOWER EXTREMITY SPECIAL TESTS :  (L) Hip special tests: Portia Brittle (FABER) test: positive  and Ober's test: positive  - tightness/pain in posterolateral aspect of hip  FUNCTIONAL TESTS:  5 times sit to stand: 13s without UE assist 30 seconds chair stand test 10 repetitions without UE assist Norms:  Age 66-64 30-69 1-74 25-79 37-84 51-89 60-94  Women 15 15 14 13 12 11 9   Men 17 16 15 14 13 11 9    GAIT: Distance walked: Test next visit Assistive device utilized: None Level of assistance: Complete Independence Comments: L LE - decreased hip extension, decreased stance time, decreased foot clearance                                                                                                                               TREATMENT DATE:   11/22/23 Standing: Out/out in/in steps with green theraband around knees x 10 Side steps with green theraband around knees x 10 each Hip extension with green theraband around knees 2 x 10 each Left step downs 2 box 2 x 12 6 box step ups up left down with the right 2 x 10 Seated: Hip abduction green theraband around knees 2 x 10 Nustep seat 7 x 5' to end treatment    11/20/2023  -Supine bridge x15 w/ 3'' isometric with GTB -Frog bridges x 15 w/ 5'' isometric -Side lying clamshells with GTB 2 x 15'' 3 second isometric -12in box elevated bridges for HS isometric w/ 3'' isometric 2 x 12 -Standing hip  abduction vector x 15 with 3'' isometric at // bars -Sit/stand with tidal ball and GTB at knees x 15  10/25/23 Eval + HEP printout and education   PATIENT EDUCATION:  Education details: patient education on pathology and sxs of increased  Person educated: Patient Education method: Programmer, multimedia, Demonstration, Tactile cues, and Handouts Education comprehension: verbalized understanding, returned demonstration, and verbal cues required  HOME EXERCISE PROGRAM: - Bridging w/ hip abduction and core engagement - 3 x 10 - Frog bridges (bridging in supine w/ ER and hip abduction) - 3 x 10 - ITB stretch in standing - 6 x 10s   Access Code: [email or web address] URL: [email or web address] Date: 11/20/2023 Prepared by: Irene Mannheim  Exercises - Clamshell with Resistance  - 1 x daily - 7 x weekly - 2-3 sets - 15 reps - Supine Bridge with Resistance Band  - 1 x daily - 7 x weekly - 2-3 sets - 15 reps - Supine Bilateral Hamstring Sets  - 1 x daily - 7 x weekly - 3 sets - 12-15 reps - Standing Hip Abduction with Counter Support  - 1 x daily - 7 x weekly - 3 sets - 10 reps  ASSESSMENT:  CLINICAL IMPRESSION: Today's session with continued focus on left hip and glute strengthening.  Needs cues for technique; tends to have trouble coordinating side steps.  Continues with slight antalgic gait but no pain reported throughout treatment.  Patient will benefit from continued skilled therapy services  to address deficits and promote return to optimal function.      Eval:Patient is a 68 y.o. female who was seen today for physical therapy evaluation and treatment for L hip tendinopathy and objective complaints of pain with pushing and with  functional ADL performance. Limitations in functional stair navigation, walking prolonged periods, and with sleeping impacting QOL at this time. Objectively TTP along lateral greater trochanter, L gluts, L TFL, L ITB and decreased functional BLE strength with appropriate  form noted as indicated in risk for falls in the 30s STS test below normative values for age norm. Pt educated on importance of functional hip strengthening in order to mitigate risk for reinjury and to improve tolerance to ADL. Pt reports reduced pain since injection and educated on importance of addressing underlying weakness associated with MD referred pathology with understanding noted. Pt prognosis good at this time.    OBJECTIVE IMPAIRMENTS: Abnormal gait, decreased activity tolerance, decreased balance, difficulty walking, decreased ROM, decreased strength, and impaired flexibility.   ACTIVITY LIMITATIONS: carrying, lifting, sleeping, stairs, locomotion level, and pushing  PARTICIPATION LIMITATIONS: laundry, shopping, community activity, and yard work  PERSONAL FACTORS: Age are also affecting patient's functional outcome.   REHAB POTENTIAL: Good  CLINICAL DECISION MAKING: Stable/uncomplicated  EVALUATION COMPLEXITY: Low   GOALS: Goals reviewed with patient? No  SHORT TERM GOALS: Target date: 11/13/23 Pt will be independent with HEP reporting compliance 5/7 days a week.  Baseline: prescribed Goal status: INITIAL  2.  Pt will demonstrate independence with step into tub without pain. Baseline: NT Goal status: MET  LONG TERM GOALS: Target date: 12/13/23  Pt will report no pain with compliance with HEP and performance of activities in home.  Baseline: limiting activity secondary to slight pain Goal status: INITIAL  2.  Pt will improve 30s stand from chair test within normative values for age without pain and with proper form.  Baseline: below norm Goal status: INITIAL  3.  Pt will report no pain with palpation along lateral L hip needed for improving tolerance to sleeping on side and report tolerance to sleeping without waking from pain.  Baseline: painful for s/lying Goal status: INITIAL  4.  Pt will achieve MMT at least 4+/5 in hip abduction on L side without pain in  order to indicate improved functional glut strength needed for transfers and endurance with gait.  Baseline:  Goal status: INITIAL  PLAN:  PT FREQUENCY: 1-2x/week  PT DURATION: 6 weeks  PLANNED INTERVENTIONS: 97110-Therapeutic exercises, 97530- Therapeutic activity, 97112- Neuromuscular re-education, 97535- Self Care, 16109- Manual therapy, [phone]- Gait training, Patient/Family education, Balance training, and Stair training  PLAN FOR NEXT SESSION: goals reviewed; progression of core engagement and lumbopelvic stability interventions 1:42 PM, 11/22/23 Curlie Sittner Small Dierra Riesgo MPT [facility] [place] [phone] Ph:[phone]

## 2023-12-03 ENCOUNTER — Ambulatory Visit (HOSPITAL_COMMUNITY)

## 2023-12-03 ENCOUNTER — Encounter (HOSPITAL_COMMUNITY): Payer: Self-pay

## 2023-12-03 DIAGNOSIS — M25552 Pain in left hip: Secondary | ICD-10-CM | POA: Diagnosis not present

## 2023-12-03 DIAGNOSIS — R262 Difficulty in walking, not elsewhere classified: Secondary | ICD-10-CM

## 2023-12-03 NOTE — Therapy (Signed)
 OUTPATIENT PHYSICAL THERAPY LOWER EXTREMITY TREATMENT   Patient Name: Sabrina Duke MRN: [id number] DOB:05/12/1956, 68 y.o., female Today's Date: 12/03/2023  END OF SESSION:  PT End of Session - 12/03/23 1312     Visit Number 4    Number of Visits 24    Date for PT Re-Evaluation 12/13/23    Authorization Type [employer] Medicare    Authorization Time Period 20 visits from 10/25/23 - 1117/25    Authorization - Visit Number 4    Authorization - Number of Visits 20    Progress Note Due on Visit 10    PT Start Time 1307    PT Stop Time 1344    PT Time Calculation (min) 37 min    Activity Tolerance Patient tolerated treatment well    Behavior During Therapy WFL for tasks assessed/performed            Past Medical History:  Diagnosis Date   Allergic rhinitis    Chronic kidney disease    Decreased bone density    Depression    GERD (gastroesophageal reflux disease)    Hypothyroid    Iron deficiency anemia    Low vitamin D level    Migraine    Osteoporosis    Past Surgical History:  Procedure Laterality Date   BIOPSY  03/10/2019   Procedure: BIOPSY;  Surgeon: Golda Claudis PENNER, MD;  Location: [facility];  Service: Endoscopy;;  gastric    BIOPSY  03/24/2020   Procedure: BIOPSY;  Surgeon: Golda Claudis PENNER, MD;  Location: [facility];  Service: Endoscopy;;  duodenum gastric   COLONOSCOPY WITH PROPOFOL  N/A 03/24/2020   Procedure: COLONOSCOPY WITH PROPOFOL ;  Surgeon: Golda Claudis PENNER, MD;  Location: [facility];  Service: Endoscopy;  Laterality: N/A;  1225   ESOPHAGOGASTRODUODENOSCOPY N/A 03/10/2019   Procedure: ESOPHAGOGASTRODUODENOSCOPY (EGD);  Surgeon: Golda Claudis PENNER, MD;  Location: [facility];  Service: Endoscopy;  Laterality: N/A;  2:40-rescheduled 10/5 @11 :55am per office   ESOPHAGOGASTRODUODENOSCOPY (EGD) WITH PROPOFOL  N/A 03/24/2020   Procedure: ESOPHAGOGASTRODUODENOSCOPY (EGD) WITH PROPOFOL ;  Surgeon: Golda Claudis PENNER, MD;  Location: [facility];  Service:  Endoscopy;  Laterality: N/A;   GLUTEUS MINIMUS REPAIR Left 05/15/2023   Procedure: LEFT GLUTEUS MEDIUS  REPAIR;  Surgeon: Genelle Standing, MD;  Location: [facility];  Service: Orthopedics;  Laterality: Left;   hemroidectomy  1990   OVARIAN CYST SURGERY  1972   SHOULDER SURGERY Left 11/2017   TUBAL LIGATION  1997   uterine polyp removal  05/2015   Patient Active Problem List   Diagnosis Date Noted   Tendinopathy of gluteus medius 05/15/2023   Loud snoring 04/20/2022   Uncontrolled morning headache 04/20/2022   Reactive depression 04/20/2022   Celiac disease 06/14/2020   Abdominal pain, left lower quadrant 06/14/2020   Constipation 06/14/2020   IBS (irritable bowel syndrome) 03/15/2020   Ileus (HCC) 01/16/2020   GERD (gastroesophageal reflux disease)    Hypothyroid    Abdominal pain, epigastric 02/03/2019   Nausea without vomiting 02/03/2019   Acid reflux 02/03/2019   Anxiety state 04/09/2018   Migraine, unspecified, intractable, with status migrainosus 04/09/2018   Insomnia 04/09/2018   Chronic migraine without aura without status migrainosus, not intractable 07/02/2017    PCP: Fernand Pilsner DO  REFERRING PROVIDER: Genelle Standing MD  REFERRING DIAG: [id number] (ICD-10-CM) - Tendinopathy of gluteus medius   THERAPY DIAG:  Pain in left hip  Difficulty in walking, not elsewhere classified  Rationale for Evaluation and  Treatment: Rehabilitation  ONSET DATE: 05/2023  MD note (09/26/23) Assessment - status post left hip gluteus medius tendon repair with some lateral based pain over the incisional repair. At today's visit I did recommend ultrasound-guided injection over the gluteal area to hopefully get her some pain relief as she is able to work through physical therapy.  I would like her to be seen by physical therapy that any pain location to continue to work on hip and gluteal strengthening.  I do believe should also benefit from IT mobilization   SUBJECTIVE:    SUBJECTIVE STATEMENT: No pain today. Pt reporting that the beach house was very nice but had a lot of stairs and she as fatigued.  Eval: Only got to do a couple weeks of PT due to her husband being sick and went back to the MD and MD prescribed her back to PT and gave her an injection and it feels better. She can't push a grocery cart, sleep on the R side even with a pillow b/c the L hurts. Wear tennis shoes on unstable surfaces more but wears flat/flip flops on even ground.  PERTINENT HISTORY: Hx L glut med repair (05/2023); migraine headaches; L greater trochanter injection (09/26/23) PAIN:  Are you having pain? Yes: NPRS scale: 3/10 - pushing grocery cart on concrete floor Pain location: L hip lateral aspect Pain description: ache burn Aggravating factors: pushing a grocery cart, laying on the side Relieving factors: steroid injection  PRECAUTIONS: None  RED FLAGS: None   WEIGHT BEARING RESTRICTIONS: No  FALLS:  Has patient fallen in last 6 months? No  LIVING ENVIRONMENT: Lives with: lives with their spouse Lives in: House/apartment Stairs: No Has following equipment at home: None  OCCUPATION: Retired; training a Printmaker now  PLOF: Independent  PATIENT GOALS: Be able to prevent any pain from coming back  NEXT MD VISIT: 11/21/23  OBJECTIVE:  Note: Objective measures were completed at Evaluation unless otherwise noted.  PATIENT SURVEYS:  LEFS - 45/80  COGNITION: Overall cognitive status: Within functional limits for tasks assessed     SENSATION: WFL  EDEMA:  none  POSTURE: No Significant postural limitations  PALPATION: Lateral aspect of L glut/IT B tenderness to light palpation; max TTP and c/o pain with attempts at gentle rolling of ITB from hip to lower 2/3 thigh Greater troch pinpoint tenderness Glut max TTP/mtrp found below iliac crest  LOWER EXTREMITY ROM: **ITB tightness noted  Passive ROM Right eval Left eval  Hip flexion    Hip extension     Hip abduction    Hip adduction  Slightly limited secondary to ITB tightness/pain  Hip internal rotation  Slightly limited secondary to pain  Hip external rotation    Knee flexion    Knee extension    Ankle dorsiflexion    Ankle plantarflexion    Ankle inversion    Ankle eversion     (Blank rows = not tested)  LOWER EXTREMITY MMT:  MMT Right eval Left eval  Hip flexion 4+ 4-  Hip extension 4 4-  Hip abduction 4 4-  Hip adduction    Hip internal rotation    Hip external rotation    Knee flexion 4 4  Knee extension 4+ 4+  Ankle dorsiflexion 4+ 4+  Ankle plantarflexion    Ankle inversion    Ankle eversion     (Blank rows = not tested)  LOWER EXTREMITY SPECIAL TESTS :  (L) Hip special tests: Belvie (FABER) test: positive  and Ober's test:  positive  - tightness/pain in posterolateral aspect of hip  FUNCTIONAL TESTS:  5 times sit to stand: 13s without UE assist 30 seconds chair stand test 10 repetitions without UE assist Norms:   Age 8-64 22-69 72-74 47-79 16-84 57-89 23-94  Women 15 15 14 13 12 11 9   Men 17 16 15 14 13 11 9    GAIT: Distance walked: Test next visit Assistive device utilized: None Level of assistance: Complete Independence Comments: L LE - decreased hip extension, decreased stance time, decreased foot clearance                                                                                                                               TREATMENT DATE:   12/03/2023  -4in step downs on LLE 2 x 15 -monster walks forward/backward 56ft x 6- cued for proper coordinated movement patterns -Weighted sit/stands with 10lb KB and GTB at knees 3 x 10 -forward stepups on 4in box with 2in foam pad x 12 w/ 10lb KB -lateral stepups on 4in box with 2in foam pad x 12 w/ 10lb KB -Standing shoulder extension with marching x 20  11/22/23 Standing: Out/out in/in steps with green theraband around knees x 10 Side steps with green theraband around knees x 10 each Hip  extension with green theraband around knees 2 x 10 each Left step downs 2 box 2 x 12 6 box step ups up left down with the right 2 x 10 Seated: Hip abduction green theraband around knees 2 x 10 Nustep seat 7 x 5' to end treatment  11/20/2023  -Supine bridge x15 w/ 3'' isometric with GTB -Frog bridges x 15 w/ 5'' isometric -Side lying clamshells with GTB 2 x 15'' 3 second isometric -12in box elevated bridges for HS isometric w/ 3'' isometric 2 x 12 -Standing hip abduction vector x 15 with 3'' isometric at // bars -Sit/stand with tidal ball and GTB at knees x 15   PATIENT EDUCATION:  Education details: patient education on pathology and sxs of increased  Person educated: Patient Education method: Programmer, multimedia, Demonstration, Tactile cues, and Handouts Education comprehension: verbalized understanding, returned demonstration, and verbal cues required  HOME EXERCISE PROGRAM: - Bridging w/ hip abduction and core engagement - 3 x 10 - Frog bridges (bridging in supine w/ ER and hip abduction) - 3 x 10 - ITB stretch in standing - 6 x 10s   Access Code: [email or web address] URL: [email or web address] Date: 11/20/2023 Prepared by: Omega Bottcher  Exercises - Clamshell with Resistance  - 1 x daily - 7 x weekly - 2-3 sets - 15 reps - Supine Bridge with Resistance Band  - 1 x daily - 7 x weekly - 2-3 sets - 15 reps - Supine Bilateral Hamstring Sets  - 1 x daily - 7 x weekly - 3 sets - 12-15 reps - Standing Hip Abduction with Counter Support  - 1 x daily - 7 x weekly - 3 sets -  10 reps  ASSESSMENT:  CLINICAL IMPRESSION: Pt tolerating session well. No antalgia or limping throughout transitions today. Pt with noticeable fatigue in gluteal with interventions adequate rest breaks given. Pt overall progressing well.  Patient will benefit from continued skilled therapy services  to address deficits and promote return to optimal function.      Eval:Patient is a 68 y.o. female who was seen  today for physical therapy evaluation and treatment for L hip tendinopathy and objective complaints of pain with pushing and with functional ADL performance. Limitations in functional stair navigation, walking prolonged periods, and with sleeping impacting QOL at this time. Objectively TTP along lateral greater trochanter, L gluts, L TFL, L ITB and decreased functional BLE strength with appropriate form noted as indicated in risk for falls in the 30s STS test below normative values for age norm. Pt educated on importance of functional hip strengthening in order to mitigate risk for reinjury and to improve tolerance to ADL. Pt reports reduced pain since injection and educated on importance of addressing underlying weakness associated with MD referred pathology with understanding noted. Pt prognosis good at this time.    OBJECTIVE IMPAIRMENTS: Abnormal gait, decreased activity tolerance, decreased balance, difficulty walking, decreased ROM, decreased strength, and impaired flexibility.   ACTIVITY LIMITATIONS: carrying, lifting, sleeping, stairs, locomotion level, and pushing  PARTICIPATION LIMITATIONS: laundry, shopping, community activity, and yard work  PERSONAL FACTORS: Age are also affecting patient's functional outcome.   REHAB POTENTIAL: Good  CLINICAL DECISION MAKING: Stable/uncomplicated  EVALUATION COMPLEXITY: Low   GOALS: Goals reviewed with patient? No  SHORT TERM GOALS: Target date: 11/13/23 Pt will be independent with HEP reporting compliance 5/7 days a week.  Baseline: prescribed Goal status: INITIAL  2.  Pt will demonstrate independence with step into tub without pain. Baseline: NT Goal status: MET  LONG TERM GOALS: Target date: 12/13/23  Pt will report no pain with compliance with HEP and performance of activities in home.  Baseline: limiting activity secondary to slight pain Goal status: INITIAL  2.  Pt will improve 30s stand from chair test within normative values for  age without pain and with proper form.  Baseline: below norm Goal status: INITIAL  3.  Pt will report no pain with palpation along lateral L hip needed for improving tolerance to sleeping on side and report tolerance to sleeping without waking from pain.  Baseline: painful for s/lying Goal status: INITIAL  4.  Pt will achieve MMT at least 4+/5 in hip abduction on L side without pain in order to indicate improved functional glut strength needed for transfers and endurance with gait.  Baseline:  Goal status: INITIAL  PLAN:  PT FREQUENCY: 1-2x/week  PT DURATION: 6 weeks  PLANNED INTERVENTIONS: 97110-Therapeutic exercises, 97530- Therapeutic activity, [id number]- Neuromuscular re-education, 97535- Self Care, [id number]- Manual therapy, [phone]- Gait training, Patient/Family education, Balance training, and Stair training  PLAN FOR NEXT SESSION: progression of core engagement and lumbopelvic stability interventions  1:46 PM, 12/03/23 Omega JONETTA Bottcher PT, DPT [facility] [place] 336 [phone] office

## 2023-12-05 ENCOUNTER — Encounter (HOSPITAL_COMMUNITY)

## 2023-12-10 ENCOUNTER — Ambulatory Visit (HOSPITAL_COMMUNITY): Attending: Orthopaedic Surgery

## 2023-12-10 ENCOUNTER — Encounter (HOSPITAL_COMMUNITY): Payer: Self-pay

## 2023-12-10 DIAGNOSIS — R262 Difficulty in walking, not elsewhere classified: Secondary | ICD-10-CM | POA: Insufficient documentation

## 2023-12-10 DIAGNOSIS — M25552 Pain in left hip: Secondary | ICD-10-CM | POA: Diagnosis present

## 2023-12-10 NOTE — Therapy (Signed)
 OUTPATIENT PHYSICAL THERAPY LOWER EXTREMITY TREATMENT   Patient Name: Sabrina Duke MRN: [id number] DOB:06-23-1955, 68 y.o., female Today's Date: 12/10/2023  END OF SESSION:  PT End of Session - 12/10/23 1401     Visit Number 5    Number of Visits 24    Date for PT Re-Evaluation 12/13/23    Authorization Type [employer] Medicare    Authorization Time Period 20 visits from 10/25/23 - 1117/25    Authorization - Visit Number 5    Authorization - Number of Visits 20    Progress Note Due on Visit 10    PT Start Time 1355    PT Stop Time 1428    PT Time Calculation (min) 33 min    Activity Tolerance Patient tolerated treatment well    Behavior During Therapy WFL for tasks assessed/performed             Past Medical History:  Diagnosis Date   Allergic rhinitis    Chronic kidney disease    Decreased bone density    Depression    GERD (gastroesophageal reflux disease)    Hypothyroid    Iron deficiency anemia    Low vitamin D level    Migraine    Osteoporosis    Past Surgical History:  Procedure Laterality Date   BIOPSY  03/10/2019   Procedure: BIOPSY;  Surgeon: Golda Claudis PENNER, MD;  Location: [facility];  Service: Endoscopy;;  gastric    BIOPSY  03/24/2020   Procedure: BIOPSY;  Surgeon: Golda Claudis PENNER, MD;  Location: [facility];  Service: Endoscopy;;  duodenum gastric   COLONOSCOPY WITH PROPOFOL  N/A 03/24/2020   Procedure: COLONOSCOPY WITH PROPOFOL ;  Surgeon: Golda Claudis PENNER, MD;  Location: [facility];  Service: Endoscopy;  Laterality: N/A;  1225   ESOPHAGOGASTRODUODENOSCOPY N/A 03/10/2019   Procedure: ESOPHAGOGASTRODUODENOSCOPY (EGD);  Surgeon: Golda Claudis PENNER, MD;  Location: [facility];  Service: Endoscopy;  Laterality: N/A;  2:40-rescheduled 10/5 @11 :55am per office   ESOPHAGOGASTRODUODENOSCOPY (EGD) WITH PROPOFOL  N/A 03/24/2020   Procedure: ESOPHAGOGASTRODUODENOSCOPY (EGD) WITH PROPOFOL ;  Surgeon: Golda Claudis PENNER, MD;  Location: [facility];  Service:  Endoscopy;  Laterality: N/A;   GLUTEUS MINIMUS REPAIR Left 05/15/2023   Procedure: LEFT GLUTEUS MEDIUS  REPAIR;  Surgeon: Genelle Standing, MD;  Location: [facility];  Service: Orthopedics;  Laterality: Left;   hemroidectomy  1990   OVARIAN CYST SURGERY  1972   SHOULDER SURGERY Left 11/2017   TUBAL LIGATION  1997   uterine polyp removal  05/2015   Patient Active Problem List   Diagnosis Date Noted   Tendinopathy of gluteus medius 05/15/2023   Loud snoring 04/20/2022   Uncontrolled morning headache 04/20/2022   Reactive depression 04/20/2022   Celiac disease 06/14/2020   Abdominal pain, left lower quadrant 06/14/2020   Constipation 06/14/2020   IBS (irritable bowel syndrome) 03/15/2020   Ileus (HCC) 01/16/2020   GERD (gastroesophageal reflux disease)    Hypothyroid    Abdominal pain, epigastric 02/03/2019   Nausea without vomiting 02/03/2019   Acid reflux 02/03/2019   Anxiety state 04/09/2018   Migraine, unspecified, intractable, with status migrainosus 04/09/2018   Insomnia 04/09/2018   Chronic migraine without aura without status migrainosus, not intractable 07/02/2017    PCP: Fernand Pilsner DO  REFERRING PROVIDER: Genelle Standing MD  REFERRING DIAG: [phone] (ICD-10-CM) - Tendinopathy of gluteus medius   THERAPY DIAG:  Pain in left hip  Difficulty in walking, not elsewhere classified  Rationale for Evaluation  and Treatment: Rehabilitation  ONSET DATE: 05/2023  MD note (09/26/23) Assessment - status post left hip gluteus medius tendon repair with some lateral based pain over the incisional repair. At today's visit I did recommend ultrasound-guided injection over the gluteal area to hopefully get her some pain relief as she is able to work through physical therapy.  I would like her to be seen by physical therapy that any pain location to continue to work on hip and gluteal strengthening.  I do believe should also benefit from IT mobilization   SUBJECTIVE:    SUBJECTIVE STATEMENT: No pain today. Pt reporting that she feels much better since her last migraine. Thinks the weather caused the migraine. Arriving 8 minutes late.  Eval: Only got to do a couple weeks of PT due to her husband being sick and went back to the MD and MD prescribed her back to PT and gave her an injection and it feels better. She can't push a grocery cart, sleep on the R side even with a pillow b/c the L hurts. Wear tennis shoes on unstable surfaces more but wears flat/flip flops on even ground.  PERTINENT HISTORY: Hx L glut med repair (05/2023); migraine headaches; L greater trochanter injection (09/26/23) PAIN:  Are you having pain? Yes: NPRS scale: 3/10 - pushing grocery cart on concrete floor Pain location: L hip lateral aspect Pain description: ache burn Aggravating factors: pushing a grocery cart, laying on the side Relieving factors: steroid injection  PRECAUTIONS: None  RED FLAGS: None   WEIGHT BEARING RESTRICTIONS: No  FALLS:  Has patient fallen in last 6 months? No  LIVING ENVIRONMENT: Lives with: lives with their spouse Lives in: House/apartment Stairs: No Has following equipment at home: None  OCCUPATION: Retired; training a Printmaker now  PLOF: Independent  PATIENT GOALS: Be able to prevent any pain from coming back  NEXT MD VISIT: 11/21/23  OBJECTIVE:  Note: Objective measures were completed at Evaluation unless otherwise noted.  PATIENT SURVEYS:  LEFS - 45/80  COGNITION: Overall cognitive status: Within functional limits for tasks assessed     SENSATION: WFL  EDEMA:  none  POSTURE: No Significant postural limitations  PALPATION: Lateral aspect of L glut/IT B tenderness to light palpation; max TTP and c/o pain with attempts at gentle rolling of ITB from hip to lower 2/3 thigh Greater troch pinpoint tenderness Glut max TTP/mtrp found below iliac crest  LOWER EXTREMITY ROM: **ITB tightness noted  Passive ROM Right eval  Left eval  Hip flexion    Hip extension    Hip abduction    Hip adduction  Slightly limited secondary to ITB tightness/pain  Hip internal rotation  Slightly limited secondary to pain  Hip external rotation    Knee flexion    Knee extension    Ankle dorsiflexion    Ankle plantarflexion    Ankle inversion    Ankle eversion     (Blank rows = not tested)  LOWER EXTREMITY MMT:  MMT Right eval Left eval  Hip flexion 4+ 4-  Hip extension 4 4-  Hip abduction 4 4-  Hip adduction    Hip internal rotation    Hip external rotation    Knee flexion 4 4  Knee extension 4+ 4+  Ankle dorsiflexion 4+ 4+  Ankle plantarflexion    Ankle inversion    Ankle eversion     (Blank rows = not tested)  LOWER EXTREMITY SPECIAL TESTS :  (L) Hip special tests: Belvie (FABER) test: positive  and Ober's test: positive  - tightness/pain in posterolateral aspect of hip  FUNCTIONAL TESTS:  5 times sit to stand: 13s without UE assist 30 seconds chair stand test 10 repetitions without UE assist Norms:   Age 60-64 25-69 76-74 69-79 72-84 75-89 53-94  Women 15 15 14 13 12 11 9   Men 17 16 15 14 13 11 9    GAIT: Distance walked: Test next visit Assistive device utilized: None Level of assistance: Complete Independence Comments: L LE - decreased hip extension, decreased stance time, decreased foot clearance                                                                                                                               TREATMENT DATE:   12/10/2023  -4in step downs on LLE 2 x 15 -Weighted sit/stands with 10lb KB and GTB at knees 2 x 10 w/ mirror for visual cues and postural symmetry -forward stepups on 6in box 2 x 12 w/ 10lb KB -forward stepups on 6in box 2 x 12 w/ 10lb KB -Standing shoulder extension with marching 2 x 10 on LLE only with GTB -Single bodcraft leg press #2 plate k89 with 3'' eccentric  12/03/2023  -4in step downs on LLE 2 x 15 -monster walks forward/backward 44ft x 6-  cued for proper coordinated movement patterns -Weighted sit/stands with 10lb KB and GTB at knees 3 x 10 -forward stepups on 4in box with 2in foam pad x 12 w/ 10lb KB -lateral stepups on 4in box with 2in foam pad x 12 w/ 10lb KB -Standing shoulder extension with marching x 20  11/22/23 Standing: Out/out in/in steps with green theraband around knees x 10 Side steps with green theraband around knees x 10 each Hip extension with green theraband around knees 2 x 10 each Left step downs 2 box 2 x 12 6 box step ups up left down with the right 2 x 10 Seated: Hip abduction green theraband around knees 2 x 10 Nustep seat 7 x 5' to end treatment    PATIENT EDUCATION:  Education details: patient education on pathology and sxs of increased  Person educated: Patient Education method: Explanation, Demonstration, Tactile cues, and Handouts Education comprehension: verbalized understanding, returned demonstration, and verbal cues required  HOME EXERCISE PROGRAM: - Bridging w/ hip abduction and core engagement - 3 x 10 - Frog bridges (bridging in supine w/ ER and hip abduction) - 3 x 10 - ITB stretch in standing - 6 x 10s   Access Code: [email or web address] URL: [email or web address] Date: 11/20/2023 Prepared by: Omega Bottcher  Exercises - Clamshell with Resistance  - 1 x daily - 7 x weekly - 2-3 sets - 15 reps - Supine Bridge with Resistance Band  - 1 x daily - 7 x weekly - 2-3 sets - 15 reps - Supine Bilateral Hamstring Sets  - 1 x daily - 7 x weekly - 3 sets - 12-15 reps - Standing Hip Abduction  with Counter Support  - 1 x daily - 7 x weekly - 3 sets - 10 reps  ASSESSMENT:  CLINICAL IMPRESSION: Pt tolerating treatment session well. Pt reporting no current issues and feels that she doesn't need PT anymore. Discussed with pt regarding progress and desires to DC, PT will do progress note next session. Continues with reduced symmetry with functional transfers but antalgia has disappeared  with ambulation.  Patient will benefit from continued skilled therapy services  to address deficits and promote return to optimal function.      Eval:Patient is a 68 y.o. female who was seen today for physical therapy evaluation and treatment for L hip tendinopathy and objective complaints of pain with pushing and with functional ADL performance. Limitations in functional stair navigation, walking prolonged periods, and with sleeping impacting QOL at this time. Objectively TTP along lateral greater trochanter, L gluts, L TFL, L ITB and decreased functional BLE strength with appropriate form noted as indicated in risk for falls in the 30s STS test below normative values for age norm. Pt educated on importance of functional hip strengthening in order to mitigate risk for reinjury and to improve tolerance to ADL. Pt reports reduced pain since injection and educated on importance of addressing underlying weakness associated with MD referred pathology with understanding noted. Pt prognosis good at this time.    OBJECTIVE IMPAIRMENTS: Abnormal gait, decreased activity tolerance, decreased balance, difficulty walking, decreased ROM, decreased strength, and impaired flexibility.   ACTIVITY LIMITATIONS: carrying, lifting, sleeping, stairs, locomotion level, and pushing  PARTICIPATION LIMITATIONS: laundry, shopping, community activity, and yard work  PERSONAL FACTORS: Age are also affecting patient's functional outcome.   REHAB POTENTIAL: Good  CLINICAL DECISION MAKING: Stable/uncomplicated  EVALUATION COMPLEXITY: Low   GOALS: Goals reviewed with patient? No  SHORT TERM GOALS: Target date: 11/13/23 Pt will be independent with HEP reporting compliance 5/7 days a week.  Baseline: prescribed Goal status: INITIAL  2.  Pt will demonstrate independence with step into tub without pain. Baseline: NT Goal status: MET  LONG TERM GOALS: Target date: 12/13/23  Pt will report no pain with compliance with  HEP and performance of activities in home.  Baseline: limiting activity secondary to slight pain Goal status: INITIAL  2.  Pt will improve 30s stand from chair test within normative values for age without pain and with proper form.  Baseline: below norm Goal status: INITIAL  3.  Pt will report no pain with palpation along lateral L hip needed for improving tolerance to sleeping on side and report tolerance to sleeping without waking from pain.  Baseline: painful for s/lying Goal status: INITIAL  4.  Pt will achieve MMT at least 4+/5 in hip abduction on L side without pain in order to indicate improved functional glut strength needed for transfers and endurance with gait.  Baseline:  Goal status: INITIAL  PLAN:  PT FREQUENCY: 1-2x/week  PT DURATION: 6 weeks  PLANNED INTERVENTIONS: 97110-Therapeutic exercises, 97530- Therapeutic activity, [id number]- Neuromuscular re-education, 97535- Self Care, [id number]- Manual therapy, [phone]- Gait training, Patient/Family education, Balance training, and Stair training  PLAN FOR NEXT SESSION:  Pt wants to DC, progress note next session. Progression of core engagement and lumbopelvic stability interventions  2:28 PM, 12/10/23 Omega JONETTA Bottcher PT, DPT [facility] [place] [phone] office

## 2023-12-11 ENCOUNTER — Telehealth: Payer: Self-pay | Admitting: Neurology

## 2023-12-11 ENCOUNTER — Telehealth: Payer: Self-pay | Admitting: Family Medicine

## 2023-12-11 NOTE — Telephone Encounter (Signed)
 Attempted to call Pt back , No answer , LVM to call back

## 2023-12-11 NOTE — Telephone Encounter (Signed)
 Pt called to request a change in Provider . Pt states she doesn't want to see  Provider  that she has , Pt wants to be seen by Dr. Ines. Pt didn't go in detail but she states she didn't  want  to see provider  she scheduled with

## 2023-12-11 NOTE — Telephone Encounter (Signed)
 Phone room: Please call back and reschedule with Dr. Ines if that is what pt wants. She is her primary doctor.   Reviewed pt chart. Last saw Amy L,NP 02/28/23. Primary MD: Dr. Ines. She saw Dr. Chalice in the past for sleep related concerns. Currently scheduled 02/28/24 with Amy Lomax,NP.

## 2023-12-12 ENCOUNTER — Encounter (HOSPITAL_COMMUNITY)

## 2023-12-12 ENCOUNTER — Encounter (HOSPITAL_COMMUNITY): Payer: Self-pay

## 2023-12-12 NOTE — Therapy (Signed)
 [facility] at [facility] [address] [place], [place], [address] Phone: [phone]   Fax:  [phone]  Patient Details  Name: Sabrina Duke MRN: [id number] Date of Birth: [birth year] Referring Provider:  No ref. provider found  Encounter Date: 12/12/2023  PT called pt regarding no show for appointment and provided patient with next appointment as well as call  back number to clinic for any updates.   Lamarr LITTIE Citrin, PT 12/12/2023, 4:55 PM  [facility] at [facility] [address] [place], [place], [address] Phone: [phone]   Fax:  [phone]

## 2023-12-12 NOTE — Therapy (Incomplete)
 OUTPATIENT PHYSICAL THERAPY LOWER EXTREMITY TREATMENT   Patient Name: Sabrina Duke MRN: [id number] DOB:1956/04/24, 68 y.o., female Today's Date: 12/12/2023  END OF SESSION:       Past Medical History:  Diagnosis Date   Allergic rhinitis    Chronic kidney disease    Decreased bone density    Depression    GERD (gastroesophageal reflux disease)    Hypothyroid    Iron deficiency anemia    Low vitamin D level    Migraine    Osteoporosis    Past Surgical History:  Procedure Laterality Date   BIOPSY  03/10/2019   Procedure: BIOPSY;  Surgeon: Golda Claudis PENNER, MD;  Location: [facility];  Service: Endoscopy;;  gastric    BIOPSY  03/24/2020   Procedure: BIOPSY;  Surgeon: Golda Claudis PENNER, MD;  Location: [facility];  Service: Endoscopy;;  duodenum gastric   COLONOSCOPY WITH PROPOFOL  N/A 03/24/2020   Procedure: COLONOSCOPY WITH PROPOFOL ;  Surgeon: Golda Claudis PENNER, MD;  Location: [facility];  Service: Endoscopy;  Laterality: N/A;  1225   ESOPHAGOGASTRODUODENOSCOPY N/A 03/10/2019   Procedure: ESOPHAGOGASTRODUODENOSCOPY (EGD);  Surgeon: Golda Claudis PENNER, MD;  Location: [facility];  Service: Endoscopy;  Laterality: N/A;  2:40-rescheduled 10/5 @11 :55am per office   ESOPHAGOGASTRODUODENOSCOPY (EGD) WITH PROPOFOL  N/A 03/24/2020   Procedure: ESOPHAGOGASTRODUODENOSCOPY (EGD) WITH PROPOFOL ;  Surgeon: Golda Claudis PENNER, MD;  Location: [facility];  Service: Endoscopy;  Laterality: N/A;   GLUTEUS MINIMUS REPAIR Left 05/15/2023   Procedure: LEFT GLUTEUS MEDIUS  REPAIR;  Surgeon: Genelle Standing, MD;  Location: [facility];  Service: Orthopedics;  Laterality: Left;   hemroidectomy  1990   OVARIAN CYST SURGERY  1972   SHOULDER SURGERY Left 11/2017   TUBAL LIGATION  1997   uterine polyp removal  05/2015   Patient Active Problem List   Diagnosis Date Noted   Tendinopathy of gluteus medius 05/15/2023   Loud snoring 04/20/2022   Uncontrolled morning headache  04/20/2022   Reactive depression 04/20/2022   Celiac disease 06/14/2020   Abdominal pain, left lower quadrant 06/14/2020   Constipation 06/14/2020   IBS (irritable bowel syndrome) 03/15/2020   Ileus (HCC) 01/16/2020   GERD (gastroesophageal reflux disease)    Hypothyroid    Abdominal pain, epigastric 02/03/2019   Nausea without vomiting 02/03/2019   Acid reflux 02/03/2019   Anxiety state 04/09/2018   Migraine, unspecified, intractable, with status migrainosus 04/09/2018   Insomnia 04/09/2018   Chronic migraine without aura without status migrainosus, not intractable 07/02/2017    PCP: Fernand Pilsner DO  REFERRING PROVIDER: Genelle Standing MD  REFERRING DIAG: [phone] (ICD-10-CM) - Tendinopathy of gluteus medius   THERAPY DIAG:  No diagnosis found.  Rationale for Evaluation and Treatment: Rehabilitation  ONSET DATE: 05/2023  MD note (09/26/23) Assessment - status post left hip gluteus medius tendon repair with some lateral based pain over the incisional repair. At today's visit I did recommend ultrasound-guided injection over the gluteal area to hopefully get her some pain relief as she is able to work through physical therapy.  I would like her to be seen by physical therapy that any pain location to continue to work on hip and gluteal strengthening.  I do believe should also benefit from IT mobilization   SUBJECTIVE:   SUBJECTIVE STATEMENT: No pain today. Pt reporting that she feels much better since her last migraine. Thinks the weather caused the migraine. Arriving 8 minutes late.  Eval: Only got to do a couple  weeks of PT due to her husband being sick and went back to the MD and MD prescribed her back to PT and gave her an injection and it feels better. She can't push a grocery cart, sleep on the R side even with a pillow b/c the L hurts. Wear tennis shoes on unstable surfaces more but wears flat/flip flops on even ground.  PERTINENT HISTORY: Hx L glut med repair (05/2023);  migraine headaches; L greater trochanter injection (09/26/23) PAIN:  Are you having pain? Yes: NPRS scale: 3/10 - pushing grocery cart on concrete floor Pain location: L hip lateral aspect Pain description: ache burn Aggravating factors: pushing a grocery cart, laying on the side Relieving factors: steroid injection  PRECAUTIONS: None  RED FLAGS: None   WEIGHT BEARING RESTRICTIONS: No  FALLS:  Has patient fallen in last 6 months? No  LIVING ENVIRONMENT: Lives with: lives with their spouse Lives in: House/apartment Stairs: No Has following equipment at home: None  OCCUPATION: Retired; training a Printmaker now  PLOF: Independent  PATIENT GOALS: Be able to prevent any pain from coming back  NEXT MD VISIT: 11/21/23  OBJECTIVE:  Note: Objective measures were completed at Evaluation unless otherwise noted.  PATIENT SURVEYS:  LEFS - 45/80  COGNITION: Overall cognitive status: Within functional limits for tasks assessed     SENSATION: WFL  EDEMA:  none  POSTURE: No Significant postural limitations  PALPATION: Lateral aspect of L glut/IT B tenderness to light palpation; max TTP and c/o pain with attempts at gentle rolling of ITB from hip to lower 2/3 thigh Greater troch pinpoint tenderness Glut max TTP/mtrp found below iliac crest  LOWER EXTREMITY ROM: **ITB tightness noted  Passive ROM Right eval Left eval  Hip flexion    Hip extension    Hip abduction    Hip adduction  Slightly limited secondary to ITB tightness/pain  Hip internal rotation  Slightly limited secondary to pain  Hip external rotation    Knee flexion    Knee extension    Ankle dorsiflexion    Ankle plantarflexion    Ankle inversion    Ankle eversion     (Blank rows = not tested)  LOWER EXTREMITY MMT:  MMT Right eval Left eval  Hip flexion 4+ 4-  Hip extension 4 4-  Hip abduction 4 4-  Hip adduction    Hip internal rotation    Hip external rotation    Knee flexion 4 4  Knee  extension 4+ 4+  Ankle dorsiflexion 4+ 4+  Ankle plantarflexion    Ankle inversion    Ankle eversion     (Blank rows = not tested)  LOWER EXTREMITY SPECIAL TESTS :  (L) Hip special tests: Belvie (FABER) test: positive  and Ober's test: positive  - tightness/pain in posterolateral aspect of hip  FUNCTIONAL TESTS:  5 times sit to stand: 13s without UE assist 30 seconds chair stand test 10 repetitions without UE assist Norms:   Age 20-64 51-69 36-74 75-79 67-84 85-89 90-94  Women 15 15 14 13 12 11 9   Men 17 16 15 14 13 11 9    GAIT: Distance walked: Test next visit Assistive device utilized: None Level of assistance: Complete Independence Comments: L LE - decreased hip extension, decreased stance time, decreased foot clearance  TREATMENT DATE:   12/10/2023  -4in step downs on LLE 2 x 15 -Weighted sit/stands with 10lb KB and GTB at knees 2 x 10 w/ mirror for visual cues and postural symmetry -forward stepups on 6in box 2 x 12 w/ 10lb KB -forward stepups on 6in box 2 x 12 w/ 10lb KB -Standing shoulder extension with marching 2 x 10 on LLE only with GTB -Single bodcraft leg press #2 plate k89 with 3'' eccentric  12/03/2023  -4in step downs on LLE 2 x 15 -monster walks forward/backward 94ft x 6- cued for proper coordinated movement patterns -Weighted sit/stands with 10lb KB and GTB at knees 3 x 10 -forward stepups on 4in box with 2in foam pad x 12 w/ 10lb KB -lateral stepups on 4in box with 2in foam pad x 12 w/ 10lb KB -Standing shoulder extension with marching x 20  11/22/23 Standing: Out/out in/in steps with green theraband around knees x 10 Side steps with green theraband around knees x 10 each Hip extension with green theraband around knees 2 x 10 each Left step downs 2 box 2 x 12 6 box step ups up left down with the right 2 x 10 Seated: Hip  abduction green theraband around knees 2 x 10 Nustep seat 7 x 5' to end treatment    PATIENT EDUCATION:  Education details: patient education on pathology and sxs of increased  Person educated: Patient Education method: Explanation, Demonstration, Tactile cues, and Handouts Education comprehension: verbalized understanding, returned demonstration, and verbal cues required  HOME EXERCISE PROGRAM: - Bridging w/ hip abduction and core engagement - 3 x 10 - Frog bridges (bridging in supine w/ ER and hip abduction) - 3 x 10 - ITB stretch in standing - 6 x 10s   Access Code: [email or web address] URL: [email or web address] Date: 11/20/2023 Prepared by: Omega Bottcher  Exercises - Clamshell with Resistance  - 1 x daily - 7 x weekly - 2-3 sets - 15 reps - Supine Bridge with Resistance Band  - 1 x daily - 7 x weekly - 2-3 sets - 15 reps - Supine Bilateral Hamstring Sets  - 1 x daily - 7 x weekly - 3 sets - 12-15 reps - Standing Hip Abduction with Counter Support  - 1 x daily - 7 x weekly - 3 sets - 10 reps  ASSESSMENT:  CLINICAL IMPRESSION: Pt tolerating treatment session well. Pt reporting no current issues and feels that she doesn't need PT anymore. Discussed with pt regarding progress and desires to DC, PT will do progress note next session. Continues with reduced symmetry with functional transfers but antalgia has disappeared with ambulation.  Patient will benefit from continued skilled therapy services  to address deficits and promote return to optimal function.      Eval:Patient is a 68 y.o. female who was seen today for physical therapy evaluation and treatment for L hip tendinopathy and objective complaints of pain with pushing and with functional ADL performance. Limitations in functional stair navigation, walking prolonged periods, and with sleeping impacting QOL at this time. Objectively TTP along lateral greater trochanter, L gluts, L TFL, L ITB and decreased functional BLE  strength with appropriate form noted as indicated in risk for falls in the 30s STS test below normative values for age norm. Pt educated on importance of functional hip strengthening in order to mitigate risk for reinjury and to improve tolerance to ADL. Pt reports reduced pain since injection and educated on importance of addressing underlying weakness associated with MD referred  pathology with understanding noted. Pt prognosis good at this time.    OBJECTIVE IMPAIRMENTS: Abnormal gait, decreased activity tolerance, decreased balance, difficulty walking, decreased ROM, decreased strength, and impaired flexibility.   ACTIVITY LIMITATIONS: carrying, lifting, sleeping, stairs, locomotion level, and pushing  PARTICIPATION LIMITATIONS: laundry, shopping, community activity, and yard work  PERSONAL FACTORS: Age are also affecting patient's functional outcome.   REHAB POTENTIAL: Good  CLINICAL DECISION MAKING: Stable/uncomplicated  EVALUATION COMPLEXITY: Low   GOALS: Goals reviewed with patient? No  SHORT TERM GOALS: Target date: 11/13/23 Pt will be independent with HEP reporting compliance 5/7 days a week.  Baseline: prescribed Goal status: INITIAL  2.  Pt will demonstrate independence with step into tub without pain. Baseline: NT Goal status: MET  LONG TERM GOALS: Target date: 12/13/23  Pt will report no pain with compliance with HEP and performance of activities in home.  Baseline: limiting activity secondary to slight pain Goal status: INITIAL  2.  Pt will improve 30s stand from chair test within normative values for age without pain and with proper form.  Baseline: below norm Goal status: INITIAL  3.  Pt will report no pain with palpation along lateral L hip needed for improving tolerance to sleeping on side and report tolerance to sleeping without waking from pain.  Baseline: painful for s/lying Goal status: INITIAL  4.  Pt will achieve MMT at least 4+/5 in hip abduction on L  side without pain in order to indicate improved functional glut strength needed for transfers and endurance with gait.  Baseline:  Goal status: INITIAL  PLAN:  PT FREQUENCY: 1-2x/week  PT DURATION: 6 weeks  PLANNED INTERVENTIONS: 97110-Therapeutic exercises, 97530- Therapeutic activity, [id number]- Neuromuscular re-education, 97535- Self Care, [id number]- Manual therapy, [phone]- Gait training, Patient/Family education, Balance training, and Stair training  PLAN FOR NEXT SESSION:  Pt wants to DC, progress note next session. Progression of core engagement and lumbopelvic stability interventions  12:58 PM, 12/12/23 Omega JONETTA Bottcher PT, DPT [facility] [place] [phone] office

## 2023-12-17 ENCOUNTER — Encounter (HOSPITAL_COMMUNITY)

## 2023-12-20 ENCOUNTER — Encounter (HOSPITAL_COMMUNITY)

## 2023-12-24 ENCOUNTER — Encounter (HOSPITAL_COMMUNITY)

## 2023-12-26 ENCOUNTER — Ambulatory Visit (HOSPITAL_COMMUNITY)

## 2023-12-26 DIAGNOSIS — M25552 Pain in left hip: Secondary | ICD-10-CM

## 2023-12-26 DIAGNOSIS — R262 Difficulty in walking, not elsewhere classified: Secondary | ICD-10-CM

## 2023-12-26 NOTE — Therapy (Signed)
 OUTPATIENT PHYSICAL THERAPY LOWER EXTREMITY TREATMENT/ DISCHARGE PHYSICAL THERAPY DISCHARGE SUMMARY  Visits from Start of Care: 6  Current functional level related to goals / functional outcomes: See below   Remaining deficits: See below   Education / Equipment: HEP   Patient agrees to discharge. Patient goals were met. Patient is being discharged due to meeting the stated rehab goals.    Patient Name: Sabrina Duke MRN: [id number] DOB:May 22, 1956, 68 y.o., female Today's Date: 12/26/2023  END OF SESSION:  PT End of Session - 12/26/23 1304     Visit Number 6    Number of Visits 24    Date for PT Re-Evaluation 12/13/23    Authorization Type BCBS Medicare    Authorization Time Period 20 visits from 10/25/23 - 1117/25    Authorization - Visit Number 6    Authorization - Number of Visits 20    Progress Note Due on Visit 10    PT Start Time 1303    PT Stop Time 1343    PT Time Calculation (min) 40 min    Activity Tolerance Patient tolerated treatment well    Behavior During Therapy WFL for tasks assessed/performed             Past Medical History:  Diagnosis Date   Allergic rhinitis    Chronic kidney disease    Decreased bone density    Depression    GERD (gastroesophageal reflux disease)    Hypothyroid    Iron deficiency anemia    Low vitamin D level    Migraine    Osteoporosis    Past Surgical History:  Procedure Laterality Date   BIOPSY  03/10/2019   Procedure: BIOPSY;  Surgeon: Golda Claudis PENNER, MD;  [facility];  Service: Endoscopy;;  gastric    BIOPSY  03/24/2020   Procedure: BIOPSY;  Surgeon: Golda Claudis PENNER, MD;  [facility];  Service: Endoscopy;;  duodenum gastric   COLONOSCOPY WITH PROPOFOL  N/A 03/24/2020   Procedure: COLONOSCOPY WITH PROPOFOL ;  Surgeon: Golda Claudis PENNER, MD;  [facility]: [facility];  Service: Endoscopy;  Laterality: N/A;  1225   ESOPHAGOGASTRODUODENOSCOPY N/A 03/10/2019   Procedure:  ESOPHAGOGASTRODUODENOSCOPY (EGD);  Surgeon: Golda Claudis PENNER, MD;  [facility]: [facility];  Service: Endoscopy;  Laterality: N/A;  2:40-rescheduled 10/5 @11 :55am per office   ESOPHAGOGASTRODUODENOSCOPY (EGD) WITH PROPOFOL  N/A 03/24/2020   Procedure: ESOPHAGOGASTRODUODENOSCOPY (EGD) WITH PROPOFOL ;  Surgeon: Golda Claudis PENNER, MD;  [facility];  Service: Endoscopy;  Laterality: N/A;   GLUTEUS MINIMUS REPAIR Left 05/15/2023   Procedure: LEFT GLUTEUS MEDIUS  REPAIR;  Surgeon: Genelle Standing, MD;  Location: [facility];  Service: Orthopedics;  Laterality: Left;   hemroidectomy  1990   OVARIAN CYST SURGERY  1972   SHOULDER SURGERY Left 11/2017   TUBAL LIGATION  1997   uterine polyp removal  05/2015   Patient Active Problem List   Diagnosis Date Noted   Tendinopathy of gluteus medius 05/15/2023   Loud snoring 04/20/2022   Uncontrolled morning headache 04/20/2022   Reactive depression 04/20/2022   Celiac disease 06/14/2020   Abdominal pain, left lower quadrant 06/14/2020   Constipation 06/14/2020   IBS (irritable bowel syndrome) 03/15/2020   Ileus (HCC) 01/16/2020   GERD (gastroesophageal reflux disease)    Hypothyroid    Abdominal pain, epigastric 02/03/2019   Nausea without vomiting 02/03/2019   Acid reflux 02/03/2019   Anxiety state 04/09/2018   Migraine, unspecified, intractable, with status migrainosus 04/09/2018  Insomnia 04/09/2018   Chronic migraine without aura without status migrainosus, not intractable 07/02/2017    PCP: Fernand Pilsner DO  REFERRING PROVIDER: Genelle Standing MD  REFERRING DIAG: [phone] (ICD-10-CM) - Tendinopathy of gluteus medius   THERAPY DIAG:  Pain in left hip  Difficulty in walking, not elsewhere classified  Rationale for Evaluation and Treatment: Rehabilitation  ONSET DATE: 05/2023  MD note (09/26/23) Assessment - status post left hip gluteus medius tendon repair with some lateral based pain over the incisional repair.  At today's visit I did recommend ultrasound-guided injection over the gluteal area to hopefully get her some pain relief as she is able to work through physical therapy.  I would like her to be seen by physical therapy that any pain location to continue to work on hip and gluteal strengthening.  I do believe should also benefit from IT mobilization   SUBJECTIVE:   SUBJECTIVE STATEMENT: Feeling good; ready to discharge  Eval: Only got to do a couple weeks of PT due to her husband being sick and went back to the MD and MD prescribed her back to PT and gave her an injection and it feels better. She can't push a grocery cart, sleep on the R side even with a pillow b/c the L hurts. Wear tennis shoes on unstable surfaces more but wears flat/flip flops on even ground.  PERTINENT HISTORY: Hx L glut med repair (05/2023); migraine headaches; L greater trochanter injection (09/26/23) PAIN:  Are you having pain? Yes: NPRS scale: 3/10 - pushing grocery cart on concrete floor Pain location: L hip lateral aspect Pain description: ache burn Aggravating factors: pushing a grocery cart, laying on the side Relieving factors: steroid injection  PRECAUTIONS: None  RED FLAGS: None   WEIGHT BEARING RESTRICTIONS: No  FALLS:  Has patient fallen in last 6 months? No  LIVING ENVIRONMENT: Lives with: lives with their spouse Lives in: House/apartment Stairs: No Has following equipment at home: None  OCCUPATION: Retired; training a Printmaker now  PLOF: Independent  PATIENT GOALS: Be able to prevent any pain from coming back  NEXT MD VISIT: 11/21/23  OBJECTIVE:  Note: Objective measures were completed at Evaluation unless otherwise noted.  PATIENT SURVEYS:  LEFS - 45/80  COGNITION: Overall cognitive status: Within functional limits for tasks assessed     SENSATION: WFL  EDEMA:  none  POSTURE: No Significant postural limitations  PALPATION: Lateral aspect of L glut/IT B tenderness to light  palpation; max TTP and c/o pain with attempts at gentle rolling of ITB from hip to lower 2/3 thigh Greater troch pinpoint tenderness Glut max TTP/mtrp found below iliac crest  LOWER EXTREMITY ROM: **ITB tightness noted  Passive ROM Right eval Left eval  Hip flexion    Hip extension    Hip abduction    Hip adduction  Slightly limited secondary to ITB tightness/pain  Hip internal rotation  Slightly limited secondary to pain  Hip external rotation    Knee flexion    Knee extension    Ankle dorsiflexion    Ankle plantarflexion    Ankle inversion    Ankle eversion     (Blank rows = not tested)  LOWER EXTREMITY MMT:  MMT Right eval Left eval Right 12/26/23 Left 12/26/23  Hip flexion 4+ 4- 5 5  Hip extension 4 4- 4+ 4+  Hip abduction 4 4-  4+  Hip adduction      Hip internal rotation      Hip external rotation  Knee flexion 4 4 5 5   Knee extension 4+ 4+ 5 5  Ankle dorsiflexion 4+ 4+ 5 5  Ankle plantarflexion      Ankle inversion      Ankle eversion       (Blank rows = not tested)  LOWER EXTREMITY SPECIAL TESTS :  (L) Hip special tests: Belvie (FABER) test: positive  and Ober's test: positive  - tightness/pain in posterolateral aspect of hip  FUNCTIONAL TESTS:  5 times sit to stand: 13s without UE assist 30 seconds chair stand test 10 repetitions without UE assist Norms:   Age 64-64 64-69 80-74 56-79 38-84 35-89 24-94  Women 15 15 14 13 12 11 9   Men 17 16 15 14 13 11 9    GAIT: Distance walked: Test next visit Assistive device utilized: None Level of assistance: Complete Independence Comments: L LE - decreased hip extension, decreased stance time, decreased foot clearance                                                                                                                               TREATMENT DATE:   12/26/23 Progress note LEFS 68/80 85% 30 sec sit to stand x 16  MMT's see above Discussion of walking program and follow up with her  breathing   12/10/2023  -4in step downs on LLE 2 x 15 -Weighted sit/stands with 10lb KB and GTB at knees 2 x 10 w/ mirror for visual cues and postural symmetry -forward stepups on 6in box 2 x 12 w/ 10lb KB -forward stepups on 6in box 2 x 12 w/ 10lb KB -Standing shoulder extension with marching 2 x 10 on LLE only with GTB -Single bodcraft leg press #2 plate k89 with 3'' eccentric  12/03/2023  -4in step downs on LLE 2 x 15 -monster walks forward/backward 82ft x 6- cued for proper coordinated movement patterns -Weighted sit/stands with 10lb KB and GTB at knees 3 x 10 -forward stepups on 4in box with 2in foam pad x 12 w/ 10lb KB -lateral stepups on 4in box with 2in foam pad x 12 w/ 10lb KB -Standing shoulder extension with marching x 20  11/22/23 Standing: Out/out in/in steps with green theraband around knees x 10 Side steps with green theraband around knees x 10 each Hip extension with green theraband around knees 2 x 10 each Left step downs 2 box 2 x 12 6 box step ups up left down with the right 2 x 10 Seated: Hip abduction green theraband around knees 2 x 10 Nustep seat 7 x 5' to end treatment    PATIENT EDUCATION:  Education details: patient education on pathology and sxs of increased  Person educated: Patient Education method: Explanation, Demonstration, Tactile cues, and Handouts Education comprehension: verbalized understanding, returned demonstration, and verbal cues required  HOME EXERCISE PROGRAM: - Bridging w/ hip abduction and core engagement - 3 x 10 - Frog bridges (bridging in supine w/ ER and hip  abduction) - 3 x 10 - ITB stretch in standing - 6 x 10s   Access Code: [email or web address] URL: [email or web address] Date: 11/20/2023 Prepared by: Omega Bottcher  Exercises - Clamshell with Resistance  - 1 x daily - 7 x weekly - 2-3 sets - 15 reps - Supine Bridge with Resistance Band  - 1 x daily - 7 x weekly - 2-3 sets - 15 reps - Supine Bilateral Hamstring  Sets  - 1 x daily - 7 x weekly - 3 sets - 12-15 reps - Standing Hip Abduction with Counter Support  - 1 x daily - 7 x weekly - 3 sets - 10 reps  ASSESSMENT:  CLINICAL IMPRESSION: Progress note; discharge note today; patient has met all set rehab goals and is agreeable to discharge    Eval:Patient is a 68 y.o. female who was seen today for physical therapy evaluation and treatment for L hip tendinopathy and objective complaints of pain with pushing and with functional ADL performance. Limitations in functional stair navigation, walking prolonged periods, and with sleeping impacting QOL at this time. Objectively TTP along lateral greater trochanter, L gluts, L TFL, L ITB and decreased functional BLE strength with appropriate form noted as indicated in risk for falls in the 30s STS test below normative values for age norm. Pt educated on importance of functional hip strengthening in order to mitigate risk for reinjury and to improve tolerance to ADL. Pt reports reduced pain since injection and educated on importance of addressing underlying weakness associated with MD referred pathology with understanding noted. Pt prognosis good at this time.    OBJECTIVE IMPAIRMENTS: Abnormal gait, decreased activity tolerance, decreased balance, difficulty walking, decreased ROM, decreased strength, and impaired flexibility.   ACTIVITY LIMITATIONS: carrying, lifting, sleeping, stairs, locomotion level, and pushing  PARTICIPATION LIMITATIONS: laundry, shopping, community activity, and yard work  PERSONAL FACTORS: Age are also affecting patient's functional outcome.   REHAB POTENTIAL: Good  CLINICAL DECISION MAKING: Stable/uncomplicated  EVALUATION COMPLEXITY: Low   GOALS: Goals reviewed with patient? No  SHORT TERM GOALS: Target date: 11/13/23 Pt will be independent with HEP reporting compliance 5/7 days a week.  Baseline: prescribed Goal status: met  2.  Pt will demonstrate independence with step  into tub without pain. Baseline: NT Goal status: MET  LONG TERM GOALS: Target date: 12/13/23  Pt will report no pain with compliance with HEP and performance of activities in home.  Baseline: limiting activity secondary to slight pain Goal status: met  2.  Pt will improve 30s stand from chair test within normative values (15 x )for age without pain and with proper form.  Baseline: below norm;  Goal status: met  3.  Pt will report no pain with palpation along lateral L hip needed for improving tolerance to sleeping on side and report tolerance to sleeping without waking from pain.  Baseline: painful for s/lying Goal status:met  4.  Pt will achieve MMT at least 4+/5 in hip abduction on L side without pain in order to indicate improved functional glut strength needed for transfers and endurance with gait.  Baseline:  Goal status: met  PLAN:  PT FREQUENCY: 1-2x/week  PT DURATION: 6 weeks  PLANNED INTERVENTIONS: 97110-Therapeutic exercises, 97530- Therapeutic activity, 97112- Neuromuscular re-education, 97535- Self Care, [id number]- Manual therapy, [phone]- Gait training, Patient/Family education, Balance training, and Stair training  PLAN FOR NEXT SESSION:  discharge 1:32 PM, 12/26/23 Quadir Muns Small Jema Deegan MPT [facility] [place] [phone]

## 2024-01-02 ENCOUNTER — Ambulatory Visit (HOSPITAL_BASED_OUTPATIENT_CLINIC_OR_DEPARTMENT_OTHER): Admitting: Orthopaedic Surgery

## 2024-01-23 ENCOUNTER — Ambulatory Visit (HOSPITAL_BASED_OUTPATIENT_CLINIC_OR_DEPARTMENT_OTHER): Admitting: Orthopaedic Surgery

## 2024-01-30 ENCOUNTER — Ambulatory Visit (HOSPITAL_BASED_OUTPATIENT_CLINIC_OR_DEPARTMENT_OTHER): Admitting: Orthopaedic Surgery

## 2024-01-30 DIAGNOSIS — M67959 Unspecified disorder of synovium and tendon, unspecified thigh: Secondary | ICD-10-CM | POA: Diagnosis not present

## 2024-01-30 NOTE — Progress Notes (Signed)
 Post Operative Evaluation    Procedure/Date of Surgery: Left hip gluteus medius repair 12/10  Interval History:     Presents today status post left hip gluteus medius repair.  At this time she has been doing quite well.  She is back to normal without any pain   PMH/PSH/Family History/Social History/Meds/Allergies:    Past Medical History:  Diagnosis Date   Allergic rhinitis    Chronic kidney disease    Decreased bone density    Depression    GERD (gastroesophageal reflux disease)    Hypothyroid    Iron deficiency anemia    Low vitamin D level    Migraine    Osteoporosis    Past Surgical History:  Procedure Laterality Date   BIOPSY  03/10/2019   Procedure: BIOPSY;  Surgeon: Golda Claudis PENNER, MD;  [facility];  Service: Endoscopy;;  gastric    BIOPSY  03/24/2020   Procedure: BIOPSY;  Surgeon: Golda Claudis PENNER, MD;  [facility];  Service: Endoscopy;;  duodenum gastric   COLONOSCOPY WITH PROPOFOL  N/A 03/24/2020   Procedure: COLONOSCOPY WITH PROPOFOL ;  Surgeon: Golda Claudis PENNER, MD;  [facility];  Service: Endoscopy;  Laterality: N/A;  1225   ESOPHAGOGASTRODUODENOSCOPY N/A 03/10/2019   Procedure: ESOPHAGOGASTRODUODENOSCOPY (EGD);  Surgeon: Golda Claudis PENNER, MD;  [facility];  Service: Endoscopy;  Laterality: N/A;  2:40-rescheduled 10/5 @11 :55am per office   ESOPHAGOGASTRODUODENOSCOPY (EGD) WITH PROPOFOL  N/A 03/24/2020   Procedure: ESOPHAGOGASTRODUODENOSCOPY (EGD) WITH PROPOFOL ;  Surgeon: Golda Claudis PENNER, MD;  [facility]: [facility];  Service: Endoscopy;  Laterality: N/A;   GLUTEUS MINIMUS REPAIR Left 05/15/2023   Procedure: LEFT GLUTEUS MEDIUS  REPAIR;  Surgeon: Genelle Standing, MD;  Location: [facility];  Service: Orthopedics;  Laterality: Left;   hemroidectomy  1990   OVARIAN CYST SURGERY  1972   SHOULDER SURGERY Left 11/2017   TUBAL LIGATION  1997   uterine polyp removal   05/2015   Social History   Socioeconomic History   Marital status: Married    Spouse name: Not on file   Number of children: 1   Years of education: Not on file   Highest education level: High school graduate  Occupational History   Occupation: disabled  Tobacco Use   Smoking status: Former   Smokeless tobacco: Never  Advertising account planner   Vaping status: Never Used  Substance and Sexual Activity   Alcohol use: No   Drug use: No   Sexual activity: Not on file  Other Topics Concern   Not on file  Social History Narrative   Lives at home with husband   Right handed   No caffeine    Social Drivers of Corporate investment banker Strain: Not on file  Food Insecurity: Not on file  Transportation Needs: Not on file  Physical Activity: Not on file  Stress: Not on file  Social Connections: Not on file   Family History  Problem Relation Age of Onset   Anxiety disorder Mother    Depression Mother    Bone cancer Mother    OCD Mother    Cerebral aneurysm Maternal Uncle    Drug abuse Brother    Depression Brother    Allergies  Allergen Reactions   Bentyl  [Dicyclomine ] Other (See Comments)  Patient states it caused leg and arm spasms.   Ivp Dye [Iodinated Contrast Media] Anaphylaxis   Antihistamines, Loratadine-Type Other (See Comments)    Sleepy   Ciprofloxacin Nausea And Vomiting   Dymista [Azelastine-Fluticasone] Other (See Comments)    Heachache   Gluten Meal    Lortab [Hydrocodone -Acetaminophen ] Nausea And Vomiting   Macrobid [Nitrofurantoin] Other (See Comments)    Unknown   Montelukast Other (See Comments)    Sleep   Nsaids     Pt can tolerate small amounts of Aleve mess my stomach up, have had ulcer in the past   Nurtec [Rimegepant Sulfate] Nausea Only   Other     Patient not sure about these allergies  Darvocet-   Mermantin- makes me sick on my stomach  Bio Citrate Calcium with Vitamin D- hurt stomach      Pepcid  [Famotidine ] Other (See Comments)     Headache   Prednisone     makes me hyper and gives me a headache   Prilosec [Omeprazole ] Other (See Comments)    Headache   Rosuvastatin Calcium Other (See Comments)    Headaches.   Vantin [Cefpodoxime] Nausea And Vomiting   Zofran  [Ondansetron ] Other (See Comments)    Causes headaches per pt   Current Outpatient Medications  Medication Sig Dispense Refill   aspirin  EC 325 MG tablet Take 1 tablet (325 mg total) by mouth daily. 14 tablet 0   B-12 METHYLCOBALAMIN PO Take by mouth.     Budeson-Glycopyrrol-Formoterol (BREZTRI AEROSPHERE IN) Inhale into the lungs.     buPROPion  (WELLBUTRIN  SR) 150 MG 12 hr tablet Take 1 tablet (150 mg total) by mouth 2 (two) times daily. 60 tablet 2   Eszopiclone  3 MG TABS TAKE 1 TABLET BY MOUTH IMMEDIATELY BEFORE BEDTIME 30 tablet 5   HYDROmorphone  (DILAUDID ) 2 MG tablet Take 1 tablet (2 mg total) by mouth every 4 (four) hours as needed for severe pain (pain score 7-10). 30 tablet 0   levothyroxine  (SYNTHROID ) 50 MCG tablet Take 50 mcg by mouth daily before breakfast. Take 2 50 mg tablets daily     liothyronine (CYTOMEL) 5 MCG tablet Take 10 mcg by mouth daily.     LORazepam  (ATIVAN ) 0.5 MG tablet TAKE 1 TABLET BY MOUTH EVERY 8 HOURS 60 tablet 0   Magnesium 200 MG TABS Take 200 mg by mouth daily.      oxyCODONE  (ROXICODONE ) 5 MG immediate release tablet Take 1 tablet (5 mg total) by mouth every 4 (four) hours as needed for severe pain (pain score 7-10) or breakthrough pain. 15 tablet 0   promethazine  (PHENERGAN ) 25 MG tablet Take 1 tablet (25 mg total) by mouth every 6 (six) hours as needed for nausea or vomiting. 12 tablet 0   SUMAtriptan  (IMITREX ) 100 MG tablet Take 1 tablet (100 mg total) by mouth once as needed for up to 1 dose for migraine. May repeat once in 2 hours if headache persists or recurs. Must make an Appointment for Future Refill Request 10 tablet 11   topiramate  (TOPAMAX ) 50 MG tablet Take 3 tablets (150 mg total) by mouth at bedtime. 270  tablet 3   Vitamin D, Ergocalciferol, (DRISDOL) 1.25 MG (50000 UNIT) CAPS capsule Take 50,000 Units by mouth once a week.     No current facility-administered medications for this visit.   No results found.  Review of Systems:   A ROS was performed including pertinent positives and negatives as documented in the HPI.   Musculoskeletal Exam:  There were no vitals taken for this visit.  Left hip incision is well-appearing without erythema or drainage.  She has 3 degrees internal/external rotation of the left hip without pain.  Walks with mild antalgic gait with walker.  Distal neurosensory exam is intact  Imaging:      I personally reviewed and interpreted the radiographs.   Assessment:   status post left hip gluteus medius tendon repair overall doing extremely well.  She has no pain at today's visit.  I will plan to see her back as needed Plan :    -Return to clinic as needed    I personally saw and evaluated the patient, and participated in the management and treatment plan.  Elspeth Parker, MD Attending Physician, Orthopedic Surgery  This document was dictated using Dragon voice recognition software. A reasonable attempt at proof reading has been made to minimize errors.

## 2024-02-06 ENCOUNTER — Telehealth: Payer: Self-pay | Admitting: Family Medicine

## 2024-02-06 NOTE — Telephone Encounter (Signed)
 I called and spoke with patient regarding the below. Pt said since June she has started taking additional 1/4 tablet of Topamax  . Was prescribed Topamax  150 mg at bedtime, pt states she has been under a lot of stress and headaches are daily and taking this additional 1/4 tablet is helping her. Pt sent my chart message on 11/21/23 letting you know that she has been taking extra. Pt states the refill is about 9 days early.   Pt confirmed she is taking sumatriptan  as well.  She has refills at the pharmacy however she can't get it refilled because she is taking more medication then prescribed.   She has follow up scheduled with Dr.Ahern on 06/26/24 ( pt called and said she prefers to see her moving forward)   Please advise

## 2024-02-06 NOTE — Telephone Encounter (Signed)
 Pt is aware that she is making the request before the 11th to have the topiramate  (TOPAMAX ) 50 MG tablet filled.  Pt states the reason as to why she is asking for the refill this early is because she has had to take 1/4th of a pill in addition to what was prescribed to help with headaches, this is why she is out, pt asking the refill to be sent to [facility]

## 2024-02-07 NOTE — Telephone Encounter (Signed)
 Patient calling to check on medication refill only have enough medication for tonight. Would like a call back today.

## 2024-02-07 NOTE — Telephone Encounter (Signed)
 Sabrina Duke pt is calling back regarding the below.  Please advise

## 2024-02-07 NOTE — Telephone Encounter (Signed)
 Pt called to follow up about medication refill ,  Informed PT Nurse is working on it ,once filled pt once medication is filled  PT will get a calll.

## 2024-02-08 ENCOUNTER — Encounter: Payer: Self-pay | Admitting: Family Medicine

## 2024-02-08 ENCOUNTER — Other Ambulatory Visit: Payer: Self-pay | Admitting: Family Medicine

## 2024-02-08 DIAGNOSIS — G43709 Chronic migraine without aura, not intractable, without status migrainosus: Secondary | ICD-10-CM

## 2024-02-08 MED ORDER — TOPIRAMATE 50 MG PO TABS: ORAL_TABLET | ORAL | 3 refills | Status: DC

## 2024-02-11 NOTE — Telephone Encounter (Signed)
  Phone room: please call pt and let her know Amy sent in prescription below on 02/08/24 for her. Thank you

## 2024-02-28 ENCOUNTER — Ambulatory Visit: Payer: Medicare Other | Admitting: Family Medicine

## 2024-03-04 MED ORDER — SUMATRIPTAN SUCCINATE 100 MG PO TABS: 100.0000 mg | ORAL_TABLET | Freq: Once | ORAL | 11 refills | Status: DC | PRN

## 2024-04-07 ENCOUNTER — Encounter: Payer: Self-pay | Admitting: Radiology

## 2024-04-28 ENCOUNTER — Telehealth: Payer: Self-pay | Admitting: Neurology

## 2024-04-28 MED ORDER — TOPIRAMATE 50 MG PO TABS: ORAL_TABLET | ORAL | 3 refills | Status: DC

## 2024-04-28 NOTE — Telephone Encounter (Signed)
 Pt called stating that she has 2 days worth of medication left due to  the medication change and needs a refill  topiramate  (TOPAMAX ) 50 MG tablet.Pt stated that refill is early but she is about to be out of medication   Pt medication is to be sent to   [facility] - [place], [place] - [address] [place] (Ph: [phone])

## 2024-04-28 NOTE — Telephone Encounter (Signed)
 Rx sent.

## 2024-06-12 ENCOUNTER — Encounter: Payer: Self-pay | Admitting: Neurology

## 2024-06-12 ENCOUNTER — Ambulatory Visit: Admitting: Neurology

## 2024-06-12 ENCOUNTER — Telehealth: Payer: Self-pay | Admitting: Neurology

## 2024-06-12 DIAGNOSIS — G43709 Chronic migraine without aura, not intractable, without status migrainosus: Secondary | ICD-10-CM | POA: Diagnosis not present

## 2024-06-12 MED ORDER — SUMATRIPTAN SUCCINATE 100 MG PO TABS: 100.0000 mg | ORAL_TABLET | Freq: Once | ORAL | 11 refills | Status: DC | PRN

## 2024-06-12 MED ORDER — TOPIRAMATE 50 MG PO TABS: ORAL_TABLET | ORAL | 3 refills | Status: AC

## 2024-06-12 MED ORDER — UBRELVY 100 MG PO TABS: 100.0000 mg | ORAL_TABLET | Freq: Once | ORAL | 12 refills | Status: AC | PRN

## 2024-06-12 MED ORDER — SUMATRIPTAN SUCCINATE 100 MG PO TABS: 100.0000 mg | ORAL_TABLET | Freq: Once | ORAL | 11 refills | Status: AC | PRN

## 2024-06-12 NOTE — Progress Notes (Signed)
 "        Provider:  Dedra Gores, MD  Primary Care Physician:  Sabrina Charolett PEDLAR, DO [address] [place] [address]     Referring Provider: Group, [facility] [address] [place],  [place] [address]          Chief Complaint according to patient   Patient presents with:          [facility] Sabrina Duke,  Here for yearly migraine f/u. Pt reports migraines are the same since last ov. Taking ativan  ldaily ( psychiatrist )  for anxiety and believes it has helped HA. She  had anxiety attacks after loosing 3 brother last 5 years, she is the only surviving sibling, mother in nursing home.        HISTORY OF PRESENT ILLNESS:   Chronic migraine without aura without status migrainosus, not intractable  Sabrina Duke is a 69 y.o. female patient who is here for revisit 06/12/2024 for occipital headaches today- Chronic migraine without aura without status migrainosus, not intractable  ,  Duke sleep apnea  that was the only time I had previously seen this patient for. Sabrina Duke used to see her  but the patient  wanted to see MD , had expected to see Sabrina Duke. She expected a trigger point injection today.   She is on Topiramate  , 50 mg and 1/4 tab po.  Sumatriptan   100 mg, 10 a months. Will change to 12/months      Chief concern according to patient :  I need trigger-point injections, I have severe occipital pain,  usually migraine starts left temple I have a sinus infection as well, nasal spray (with steroids ?) Duke  success, Duke relief.  Occipital are with a nodule , that she feels this is the trigger point, ice pack gives temporary relief.    Today 02/26/18: Sabrina Duke is a 68 year old female with a history of migraine headaches.  She returns today for follow-up.  He states that her headaches have remained relatively the same.  She states that she has 10 severe headaches a month but has at least 15 headache days a month.  Her headaches always occur on the left side.  She does have photophobia and  phonophobia as well as nausea and vomiting.  She takes Maxalt  but it does not always resolve the headache.  She typically has to take Phenergan  and occasionally Compazine  to make her go to sleep for headache to resolve.  In the past she has tried multiple medications including  Inderal, Depakote, gabapentin , nortriptyline and Botox without benefit.  She returns today for evaluation.   HISTORY Sabrina Duke is a 69 y.o. female here as a referral from Sabrina Duke for migraines.  Past medical history of migraines, chronic low back pain, depression.  She has migraines for decades. She uses 12 maxalt  a month, does not use Tylenol  or ibuprofen or other medications. She takes Demerol  for severe headaches. In a month she has 20 headache days a month on average and 12 are migrainous. Barometric pressure is a trigger. Duke known other triggers, maybe stress or sleeping. She endorses photophobia/phonophobia. Migraines can last > 24 hours. Duke medication overuse. Duke aura.  Headaches are unilateral, pulsating and pounding. Can be severe. Duke other focal neurologic deficits, associated symptoms, inciting events or modifiable factors.   Numbness in the leg started 2 months ago, hurts more with leg in a recliner, tingling from the knee to the toe. She has spasms  in the back, radicular symptoms, bending over makes it worse with spasms. She has tried conservative measures for 2 months, heat, stretching, exercise, PT, NSAIDs. Duke relief. Has weakness. It is worsening slowly. Duke inciting events or trauma.   Reviewed notes, labs and imaging from outside physicians, which showed:   2013 Myelogram: FINDINGS:  AGAIN NOTED PATIENT HAS 6 LUMBAR VERTEBRAL BODIES. THERE IS A MINIMAL VENTRAL DEFECT AT THE LEVEL OF L5/6 SEEN ON LATERAL FILM. THE NERVE ROOTS ARE WELL FILLED WITHOUT EVIDENCE OF COMPRESSION OR DISPLACEMENT. THE CONUS IS NORMAL IN APPEARANCE.  CONCLUSIONS:  1.  SIX LUMBAR VERTEBRAL BODIES. 2.  Duke EVIDENCE OF NERVE ROOT  COMPRESSION OR DISPLACEMENT. 3.  SMALL VENTRAL BULGE AT L5/6. APPROVING MD: Sabrina Duke   Reviewed prior notes, patient was seen in the emergency room in November 2015 complaining of intermittent severe migraine headaches.  Localized in her left temple and left cheek described as throbbing.  She reported nausea, vomiting, blurred vision, photophobia, phonophobia.  She tried Demerol  and Compazine  with Duke relief.  Felt as though there was fluid in her ears.  Headaches improved after IV fluids and medications and she was discharged home.          Primary Neurologist: Sabrina Ines, MD   Ordering : Sabrina No, Np [address] [place],  [place] [address]             Chief Complaint according to patient   Patient presents with:      New Patient (Initial Visit)      Patient of Sabrina Duke with possible sleep related headaches.       HISTORY OF PRESENT ILLNESS:  Sabrina Duke is a 6 -year -old Caucasian female patient who is seen here upon Sabrina Duke's  referral on 04/20/2022. Chief concern according to patient :   I have the patient had her last visit with Sabrina Duke on 12-15-2021 since March 23 she reported about 8 migraine days per month, she is on topiramate  150 mg daily she also takes sumatriptan  for abortion of headaches at 100 mg.  Through June she had more headaches than before.  Her headaches fulfill a regular migraine quality but there is also bilateral maxillary and frontal pain.  Usually she has a nagging headache in the morning which worsens throughout the day.  Sometimes she wakes up with headaches in the morning when she is also nauseated and sensitive to light and sound and usually feels slightly hoarse.    Sabrina Duke  has a past medical history of Allergic rhinitis, Chronic kidney disease, Decreased bone density, Depression, GERD (gastroesophageal reflux disease), Hypothyroid, Iron deficiency anemia, Low vitamin D level, Migraine, and Osteoporosis.  Sleep relevant  medical history: Duke ENT surgery but chronic sinus problems, Duke Tonsillectomy, Duke cervical spine surgery.    Family medical /sleep history: Brother was the  other family member on CPAP with OSA, he passed at age 73 of Covid in 2021, unvaccinated.    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  Pets are present. Tobacco use: none .  ETOH use ; none ,  Caffeine  intake in form of Coffee( /) Soda( /) Tea ( /) or energy drinks. Regular exercise in form of  walking.         Sleep habits are as follows: The patient's dinner time  is between 6-7 PM. The patient goes to bed at 1 AM ,  and continues to sleep for 8 hours, wakes rarely for bathroom breaks,. She sleeps supine and wakes up snoring, right arm goes to sleep- has left hip pain, too.  Snoring. Restless sleeper- husband has left the bedroom , which is otherwise cool, quiet and dark.  The preferred sleep position is supine , with the support of 1 pillow, flat bed. . Dreams are reportedly rare/ infrequent  9.30  AM is the usual rise time. The patient wakes up spontaneously at 8 AM.  She reports not feeling refreshed or restored in AM, with symptoms such as dry mouth, morning headaches, and residual fatigue.  Naps are taken infrequently, since being on wellbutrin .   Review of Systems: Out of a complete 14 system review, the patient complains of only the following symptoms, and all other reviewed systems are negative.:  Fatigue, sleepiness , snoring, fragmented sleep, restless sleep, headaches in AM. She is not woken by headaches    How likely are you to doze in the following situations: 0 = not likely, 1 = slight chance, 2 = moderate chance, 3 = high chance   Sitting and Reading? Watching Television? Sitting inactive in a public place (theater or meeting)? As a passenger in a car for an hour without a break? Lying down in the  afternoon when circumstances permit? Sitting and talking to someone? Sitting quietly after lunch without alcohol? In a car, while stopped for a few minutes in traffic?   Total =  11/ 24 points   FSS endorsed at 57/ 63 points.  GDS 6/ 15 points.       Review of Systems: Out of a complete 14 system review, the patient complains of only the following symptoms, and all other reviewed systems are negative.:   SLEEPINESS ?  How likely are you to doze in the following situations: 0 = not likely, 1 = slight chance, 2 = moderate chance, 3 = high chance  Sitting and Reading? Watching Television? Sitting inactive in a public place (theater or meeting)? Lying down in the afternoon when circumstances permit? Sitting and talking to someone? Sitting quietly after lunch without alcohol? In a car, while stopped for a few minutes in traffic? As a passenger in a car for an hour without a break?  Total =        Social History   Socioeconomic History   Marital status: Married    Spouse name: Not on file   Number of children: 1   Years of education: Not on file   Highest education level: High school graduate  Occupational History   Occupation: disabled  Tobacco Use   Smoking status: Former   Smokeless tobacco: Never  Vaping Use   Vaping status: Never Used  Substance and Sexual Activity   Alcohol use: Duke   Drug use: Duke   Sexual activity: Not on file  Other Topics Concern   Not on file  Social History Narrative   Lives at home with husband   Right handed   Duke caffeine    Social Drivers of Health   Tobacco Use: Medium Risk (06/04/2024)   [facility]   Patient History    Smoking Tobacco Use: Former    Smokeless Tobacco Use: Never    Passive Exposure: Not on file  Financial Resource Strain: Not on file  Food Insecurity: Not on file  Transportation Needs: Not on file  Physical Activity:  Not on file  Stress: Not on file  Social Connections: Not on file   Depression (PHQ2-9): Not on file  Alcohol Screen: Not on file  Housing: Not on file  Utilities: Not on file  Health Literacy: Not on file    Family History  Problem Relation Age of Onset   Anxiety disorder Mother    Depression Mother    Bone cancer Mother    OCD Mother    Cerebral aneurysm Maternal Uncle    Drug abuse Brother    Depression Brother     Past Medical History:  Diagnosis Date   Allergic rhinitis    Chronic kidney disease    Decreased bone density    Depression    GERD (gastroesophageal reflux disease)    Hypothyroid    Iron deficiency anemia    Low vitamin D level    Migraine    Osteoporosis     Past Surgical History:  Procedure Laterality Date   BIOPSY  03/10/2019   Procedure: BIOPSY;  Surgeon: Golda Claudis PENNER, MD;  [facility];  Service: Endoscopy;;  gastric    BIOPSY  03/24/2020   Procedure: BIOPSY;  Surgeon: Golda Claudis PENNER, MD;  [facility];  Service: Endoscopy;;  duodenum gastric   COLONOSCOPY WITH PROPOFOL  N/A 03/24/2020   Procedure: COLONOSCOPY WITH PROPOFOL ;  Surgeon: Golda Claudis PENNER, MD;  [facility];  Service: Endoscopy;  Laterality: N/A;  1225   ESOPHAGOGASTRODUODENOSCOPY N/A 03/10/2019   Procedure: ESOPHAGOGASTRODUODENOSCOPY (EGD);  Surgeon: Golda Claudis PENNER, MD;  [facility]: [facility];  Service: Endoscopy;  Laterality: N/A;  2:40-rescheduled 10/5 @11 :55am per office   ESOPHAGOGASTRODUODENOSCOPY (EGD) WITH PROPOFOL  N/A 03/24/2020   Procedure: ESOPHAGOGASTRODUODENOSCOPY (EGD) WITH PROPOFOL ;  Surgeon: Golda Claudis PENNER, MD;  [facility];  Service: Endoscopy;  Laterality: N/A;   GLUTEUS MINIMUS REPAIR Left 05/15/2023   Procedure: LEFT GLUTEUS MEDIUS  REPAIR;  Surgeon: Genelle Standing, MD;  Location: [facility];  Service: Orthopedics;  Laterality: Left;   hemroidectomy  1990   OVARIAN CYST SURGERY  1972   SHOULDER SURGERY Left 11/2017   TUBAL LIGATION  1997   uterine polyp  removal  05/2015     Medications Ordered Prior to Encounter[1]  Allergies[2]   DIAGNOSTIC DATA (LABS, IMAGING, TESTING) - I reviewed patient records, labs, notes, testing and imaging myself where available.  Lab Results  Component Value Date   WBC 3.8 (L) 03/08/2020   HGB 14.6 03/08/2020   HCT 43.5 03/08/2020   MCV 88.4 03/08/2020   PLT 141 (L) 03/08/2020      Component Value Date/Time   NA 139 03/08/2020 2241   K 4.0 03/08/2020 2241   CL 109 03/08/2020 2241   CO2 22 03/08/2020 2241   GLUCOSE 97 03/08/2020 2241   BUN 17 03/08/2020 2241   CREATININE 0.96 03/08/2020 2241   CALCIUM 9.3 03/08/2020 2241   PROT 6.7 03/08/2020 2241   ALBUMIN 4.3 03/08/2020 2241   AST 20 03/08/2020 2241   ALT 14 03/08/2020 2241   ALKPHOS 78 03/08/2020 2241   BILITOT 0.5 03/08/2020 2241   GFRNONAA >60 03/08/2020 2241   GFRAA >60 03/08/2020 2241   Duke results found for: CHOL, HDL, LDLCALC, LDLDIRECT, TRIG, CHOLHDL Duke results found for: YHAJ8R Duke results found for: VITAMINB12 Lab Results  Component Value Date   TSH 1.911 01/17/2020    PHYSICAL EXAM:  Vitals:   06/12/24 1313  BP: 138/86  Pulse: 76  SpO2:  99%   Duke data found. Body mass index is 23.76 kg/m.   Wt Readings from Last 3 Encounters:  06/12/24 138 lb 6.4 oz (62.8 kg)  05/15/23 132 lb (59.9 kg)  02/28/23 138 lb (62.6 kg)     Ht Readings from Last 3 Encounters:  06/12/24 5' 4 (1.626 m)  05/15/23 5' 4 (1.626 m)  02/28/23 5' 3 (1.6 m)      General: The patient is awake, alert and appears not in acute distress and groomed. Head: Normocephalic, atraumatic.  Neck is supple.Well developed, in Duke acute distress    Neurological examination  Mentation: Alert oriented to time, place, history taking. Follows all commands speech and language fluent Cranial nerve II-XII: Pupils were equal round reactive to light. Extraocular movements were full, visual field were full on confrontational test. Facial sensation  and strength were normal.  Head turning and shoulder shrug  were normal and symmetric. Motor: The motor testing reveals 5 over 5 strength of all 4 extremities. Good symmetric motor tone is noted throughout.  Sensory: Sensory testing is intact to soft touch on all 4 extremities. Duke evidence of extinction is noted.  Coordination: Cerebellar testing reveals good finger-nose-finger and heel-to-shin bilaterally.  Gait and station: Gait is normal. Reflexes: Deep tendon reflexes are symmetric and normal bilaterally.    ASSESSMENT AND PLAN :   69 y.o. year old female  here with: Migraine. here with:   1.  Migraine headache,  16 a months plus a new onset occipital tension/  whi eon topiramate     The patient will continue on Topamax  and Imitrex  .   She did try Aimovig  however she was reluctant and she felt not right . She prefer to stay on Topiramate  only as a preventive. She had to quit her job because of memory loss (!) 15 years ago.   I cannot on short notice provide any trigger point injection. She declined beta blockers, she cannot take any other anti-seizure meds.   There is a lot of weather related trigger factors. I like to offer her ant allergy medications, but she can't take any of them : they give her cramps in her legs.   She cannot remember ever trying ubrelvy , nurtec, qulipta . She then stated she is allergic and it makes her nauseated. Then it turns out she meant phenergan  -   I want her to try UBRELVY , gave her 3 samples.  She will use these for acute abortive treatment. 100 mg.     I reviewed potential side effects with the patient.   Patient will read over the information and let me know if she wants to start this medication on a regular basis. .    I will not be able to help her further today.  I suggested a tertiary care center referral.   She is interested in following Sabrina Duke.   She is advised that if her symptoms worsen or she develops new symptoms she should let us   know.  She  may follow-up in 6 months or sooner if needed with NP .     I would like to thank Sabrina Charolett PEDLAR, DO and Group, [facility] [address] [place],  [place] [address] for allowing me to meet with this pleasant patient.   Sleep Clinic Patients are generally offered input on sleep hygiene, life style changes and how to improve compliance with medical treatment where applicable. Review and reiteration of good sleep hygiene measures is offered to any sleep clinic  patient, be it in the first consultation or with any follow up visits.    Any patient with sleepiness should be cautioned not to drive, work at heights, or operate dangerous or heavy equipment when feeling tired or sleepy.      The patient will be seen in follow-up in the sleep clinic at [facility] for discussion of test results, sleep related symptoms and treatment compliance review, further management strategies, etc.   The referring provider will be notified of the test results.   The patient's condition requires frequent monitoring and adjustments in the treatment plan, reflecting the ongoing complexity of care.  This provider is the continuing focal point for all needed services for this condition.  After spending a total time of  35  minutes face to face and time for  history taking, physical and neurologic examination, review of laboratory studies,  personal review of imaging studies, reports and results of other testing and review of referral information / records as far as provided in visit,   Electronically signed by: Sabrina Gores, MD 06/12/2024 1:18 PM  [facility] Board certified by The [employer] of Sleep Medicine and Diplomate of the [employer] of Sleep Medicine. Board certified In Neurology through the ABPN, Fellow of the [employer] of Neurology.      [1]  Current Outpatient Medications on File Prior to Visit  Medication Sig Dispense Refill   B-12  METHYLCOBALAMIN PO Take by mouth.     buPROPion  (WELLBUTRIN  SR) 150 MG 12 hr tablet Take 1 tablet (150 mg total) by mouth 2 (two) times daily. 60 tablet 2   buPROPion  (WELLBUTRIN  XL) 150 MG 24 hr tablet Take 150 mg by mouth.     Eszopiclone  3 MG TABS TAKE 1 TABLET BY MOUTH IMMEDIATELY BEFORE BEDTIME 30 tablet 5   levothyroxine  (SYNTHROID ) 50 MCG tablet Take 50 mcg by mouth daily before breakfast. Take 2 50 mg tablets daily     liothyronine (CYTOMEL) 5 MCG tablet Take 10 mcg by mouth daily.     LORazepam  (ATIVAN ) 0.5 MG tablet TAKE 1 TABLET BY MOUTH EVERY 8 HOURS 60 tablet 0   Magnesium 200 MG TABS Take 200 mg by mouth daily.      promethazine  (PHENERGAN ) 25 MG tablet Take 1 tablet (25 mg total) by mouth every 6 (six) hours as needed for nausea or vomiting. 12 tablet 0   SUMAtriptan  (IMITREX ) 100 MG tablet Take 1 tablet (100 mg total) by mouth once as needed for up to 1 dose for migraine. May repeat once in 2 hours if headache persists or recurs. 10 tablet 11   topiramate  (TOPAMAX ) 50 MG tablet Take 3 tablets (150 mg total) by mouth at bedtime. May also take 1 tablet (50 mg total) daily as needed. 360 tablet 3   Vitamin D, Ergocalciferol, (DRISDOL) 1.25 MG (50000 UNIT) CAPS capsule Take 50,000 Units by mouth once a week.     albuterol (VENTOLIN HFA) 108 (90 Base) MCG/ACT inhaler Inhale 2 puffs into the lungs every 6 (six) hours as needed.     budesonide-formoterol (SYMBICORT) 80-4.5 MCG/ACT inhaler Inhale 2 puffs into the lungs.     budesonide-glycopyrrolate -formoterol (BREZTRI) 160-9-4.8 MCG/ACT AERO inhaler Inhale 2 puffs twice a day by inhalation route as needed.     [facility]uke current facility-administered medications on file prior to visit.  [2]  Allergies Allergen Reactions   Bentyl  [Dicyclomine ] Other (See Comments)    Patient states it caused leg and arm spasms.  Ivp Dye [Iodinated Contrast Media] Anaphylaxis   Antihistamines, Loratadine-Type Other (See Comments)    Sleepy   Ciprofloxacin  Nausea And Vomiting   Dymista [Azelastine-Fluticasone] Other (See Comments)    Heachache   Gluten Meal    Lortab [Hydrocodone -Acetaminophen ] Nausea And Vomiting   Macrobid [Nitrofurantoin] Other (See Comments)    Unknown   Montelukast Other (See Comments)    Sleep   Nsaids     Pt can tolerate small amounts of Aleve mess my stomach up, have had ulcer in the past   Nurtec [Rimegepant Sulfate] Nausea Only   Other     Patient not sure about these allergies  Darvocet-   Mermantin- makes me sick on my stomach  Bio Citrate Calcium with Vitamin D- hurt stomach      Pepcid  [Famotidine ] Other (See Comments)    Headache   Prednisone     makes me hyper and gives me a headache   Prilosec [Omeprazole ] Other (See Comments)    Headache   Rosuvastatin Calcium Other (See Comments)    Headaches.   Vantin [Cefpodoxime] Nausea And Vomiting   Zofran  [Ondansetron ] Other (See Comments)    Causes headaches per pt   "

## 2024-06-12 NOTE — Telephone Encounter (Signed)
 FYI

## 2024-06-12 NOTE — Telephone Encounter (Signed)
 Pt checking out, states she wants her records being sent over to her new Neurologist. Has not found a new neurologist yet but wanted to sign the release form so she wouldn't have to come back up here after she does.  Will call when has somewhere for us  to send her records. Front has her signed release form.

## 2024-06-25 ENCOUNTER — Ambulatory Visit: Admitting: Neurology
